# Patient Record
Sex: Female | Born: 1940 | Race: White | Hispanic: No | Smoking: Never smoker
Health system: Southern US, Community
[De-identification: ages and names within clinical notes are randomized; demographics above are authoritative.]

## PROBLEM LIST (undated history)

## (undated) DIAGNOSIS — M329 Systemic lupus erythematosus, unspecified: Secondary | ICD-10-CM

## (undated) DIAGNOSIS — IMO0002 Reserved for concepts with insufficient information to code with codable children: Secondary | ICD-10-CM

## (undated) DIAGNOSIS — K219 Gastro-esophageal reflux disease without esophagitis: Secondary | ICD-10-CM

## (undated) HISTORY — PX: KNEE SURGERY: SHX244

## (undated) HISTORY — PX: CHOLECYSTECTOMY: SHX55

## (undated) HISTORY — PX: TONSILLECTOMY: SUR1361

## (undated) HISTORY — DX: Gastro-esophageal reflux disease without esophagitis: K21.9

## (undated) HISTORY — DX: Reserved for concepts with insufficient information to code with codable children: IMO0002

## (undated) HISTORY — PX: EYE SURGERY: SHX253

## (undated) HISTORY — DX: Systemic lupus erythematosus, unspecified: M32.9

## (undated) HISTORY — PX: SKIN CANCER EXCISION: SHX779

---

## 2016-02-06 DIAGNOSIS — J309 Allergic rhinitis, unspecified: Secondary | ICD-10-CM | POA: Insufficient documentation

## 2016-02-06 DIAGNOSIS — M8580 Other specified disorders of bone density and structure, unspecified site: Secondary | ICD-10-CM | POA: Insufficient documentation

## 2016-02-06 DIAGNOSIS — K449 Diaphragmatic hernia without obstruction or gangrene: Secondary | ICD-10-CM

## 2016-02-06 DIAGNOSIS — K219 Gastro-esophageal reflux disease without esophagitis: Secondary | ICD-10-CM | POA: Insufficient documentation

## 2016-02-06 DIAGNOSIS — E782 Mixed hyperlipidemia: Secondary | ICD-10-CM

## 2016-02-06 HISTORY — DX: Allergic rhinitis, unspecified: J30.9

## 2016-02-06 HISTORY — DX: Other specified disorders of bone density and structure, unspecified site: M85.80

## 2016-02-06 HISTORY — DX: Diaphragmatic hernia without obstruction or gangrene: K44.9

## 2016-02-06 HISTORY — DX: Mixed hyperlipidemia: E78.2

## 2016-04-11 DIAGNOSIS — D126 Benign neoplasm of colon, unspecified: Secondary | ICD-10-CM | POA: Insufficient documentation

## 2016-04-11 HISTORY — DX: Benign neoplasm of colon, unspecified: D12.6

## 2017-02-06 DIAGNOSIS — H409 Unspecified glaucoma: Secondary | ICD-10-CM | POA: Insufficient documentation

## 2018-07-02 DIAGNOSIS — N814 Uterovaginal prolapse, unspecified: Secondary | ICD-10-CM

## 2018-07-02 HISTORY — DX: Uterovaginal prolapse, unspecified: N81.4

## 2018-07-16 DIAGNOSIS — R928 Other abnormal and inconclusive findings on diagnostic imaging of breast: Secondary | ICD-10-CM | POA: Insufficient documentation

## 2018-07-16 HISTORY — DX: Other abnormal and inconclusive findings on diagnostic imaging of breast: R92.8

## 2018-12-12 DIAGNOSIS — H401132 Primary open-angle glaucoma, bilateral, moderate stage: Secondary | ICD-10-CM | POA: Insufficient documentation

## 2018-12-12 HISTORY — DX: Primary open-angle glaucoma, bilateral, moderate stage: H40.1132

## 2020-03-22 DIAGNOSIS — K580 Irritable bowel syndrome with diarrhea: Secondary | ICD-10-CM | POA: Insufficient documentation

## 2020-10-26 ENCOUNTER — Other Ambulatory Visit: Payer: Self-pay | Admitting: Orthopedic Surgery

## 2020-11-10 ENCOUNTER — Other Ambulatory Visit: Payer: Self-pay

## 2020-11-12 ENCOUNTER — Encounter: Payer: Self-pay | Admitting: Family Medicine

## 2020-11-12 ENCOUNTER — Ambulatory Visit: Payer: Medicare PPO | Admitting: Family Medicine

## 2020-11-12 ENCOUNTER — Other Ambulatory Visit: Payer: Self-pay

## 2020-11-12 VITALS — BP 140/72 | HR 77 | Temp 97.4°F | Ht 64.0 in | Wt 163.2 lb

## 2020-11-12 DIAGNOSIS — R5383 Other fatigue: Secondary | ICD-10-CM | POA: Diagnosis not present

## 2020-11-12 DIAGNOSIS — K5900 Constipation, unspecified: Secondary | ICD-10-CM | POA: Insufficient documentation

## 2020-11-12 DIAGNOSIS — Z2821 Immunization not carried out because of patient refusal: Secondary | ICD-10-CM | POA: Diagnosis not present

## 2020-11-12 DIAGNOSIS — Z7689 Persons encountering health services in other specified circumstances: Secondary | ICD-10-CM | POA: Diagnosis not present

## 2020-11-12 DIAGNOSIS — N3281 Overactive bladder: Secondary | ICD-10-CM

## 2020-11-12 DIAGNOSIS — Z13820 Encounter for screening for osteoporosis: Secondary | ICD-10-CM

## 2020-11-12 HISTORY — DX: Overactive bladder: N32.81

## 2020-11-12 LAB — VITAMIN D 25 HYDROXY (VIT D DEFICIENCY, FRACTURES): VITD: 48.59 ng/mL (ref 30.00–100.00)

## 2020-11-12 LAB — VITAMIN B12: Vitamin B-12: >1526 pg/mL — ABNORMAL HIGH (ref 211–911)

## 2020-11-12 NOTE — Progress Notes (Signed)
Shelby Cardenas is a 80 y.o. female  Chief Complaint  Patient presents with  . Establish Care    NP-  establish care.  C/o trouble with bowels and seeing GI,   & fatigue.      HPI: Shelby Cardenas is a 80 y.o. female seen today to establish care with our office. Previous PCP Dr. Teressa Lower with [facility] in [place], [place]. She is married with 2 grown daughters. Granddaughter works in endoscopy at [employer].   She has lower GI issues and sees Dr. Cristina Gong with [facility]. She is schedule for colonoscopy in 11/30/19.  She is doing pelvic PT - 2 appts - but has yet to notice results.   She also saw ortho hand Dr. Daryll Brod [facility]) for cyst in Rt 5th finger PIP joint. She was going to have surgery but decided on second opinion with Dr Amedeo Plenty. This is scheduled.   Urology - Dr. Maryland Pink [phone]) - OAB  Dermatology - Dr. Sharyn Blitz [facility]; pt has a non-melanoma skin cancer and Mohs surgery. Seen every 6 mo.  Ophthalmology - Dr. Amalia Hailey  Dexa: due in 07/15/2020 Colonoscopy: done 03/29/2016 Mammo - a few years  Pt declines flu vaccine today.   She has some labs drawn for fatigue in 09/2020 - TSH, CBC with diff, CMP, sed rate - WNL.  Fatigue is ongoing issue. Sleep is good overall. She walks 1 mile most days. Drinks about 1 gal of per day of "electrolyte reduced water".  Diet: lots of fruit Steel cut oatmeal with almond milk with dried fruit and nuts for breakfast. Chicken with green beans or sweet potatoes; later in day/night - yogurt and nuts.   Past Medical History:  Diagnosis Date  . GERD (gastroesophageal reflux disease)   . Lupus [facility])     Past Surgical History:  Procedure Laterality Date  . CESAREAN SECTION    . CHOLECYSTECTOMY    . KNEE SURGERY Left   . SKIN CANCER EXCISION    . TONSILLECTOMY      Social History   Socioeconomic History  . Marital status: Married    Spouse name: Not on file  . Number of children: Not on file  . Years of education: Not on file  . Highest  education level: Not on file  Occupational History  . Not on file  Tobacco Use  . Smoking status: Never Smoker  . Smokeless tobacco: Never Used  Vaping Use  . Vaping Use: Never used  Substance and Sexual Activity  . Alcohol use: Not Currently  . Drug use: Never  . Sexual activity: Yes  Other Topics Concern  . Not on file  Social History Narrative  . Not on file   Social Determinants of Health   Financial Resource Strain: Not on file  Food Insecurity: Not on file  Transportation Needs: Not on file  Physical Activity: Not on file  Stress: Not on file  Social Connections: Not on file  Intimate Partner Violence: Not on file    Family History  Problem Relation Age of Onset  . Arthritis Mother   . Arthritis Father      Immunizations: - COVID-19 Vaccine: 12/30/2019, 11/21/2019 - does not want to get booster . Tdap (ADACEL, BOOSTRIX) 03/05/2007  . pneumococcal conjugate 13-valent (PREVNAR) 05/05/2015  . pneumococcal polysaccharide 23-valent (PNEUMOVAX 23) 03/05/2007  . zoster vaccine live (PF) (ZOSTAVAX) 10/20/2009  Outpatient Encounter Medications as of 11/12/2020  Medication Sig  . b complex vitamins capsule Take 1 capsule  by mouth daily.  . Cyanocobalamin-Methylcobalamin 600-600 MCG SUBL Place under the tongue.  . Digestive Enzymes (ENZYME DIGEST) CAPS Take by mouth.  . Lactobacillus Rhamnosus, GG, (RA PROBIOTIC DIGESTIVE CARE) CAPS Take by mouth.  . latanoprost (XALATAN) 0.005 % ophthalmic solution 1 drop into affected eye in the evening  . Multiple Vitamin (MULTIVITAMIN ADULT PO) Take by mouth.  . Turmeric Curcumin 500 MG CAPS Take 1 capsule by mouth daily.   No facility-administered encounter medications on file as of 11/12/2020.     ROS: Pertinent positives and negatives noted in HPI. Remainder of ROS non-contributory   Allergies  Allergen Reactions  . Macrobid [Nitrofurantoin] Rash  . Gabapentin Other (See Comments)    Other reaction(s): Other (See  Comments) Dizzy and fell when taking    . Prednisone     Other reaction(s): Other (See Comments), weird feeling Makes me feel weird   . Tramadol Itching    Other reaction(s): unsure  . Ciprofloxacin Other (See Comments)    Other reaction(s): weird feeling UNKNOWN UNKNOWN   . Codeine Itching and Rash    Other reaction(s): itching, rash  . Plaquenil [Hydroxychloroquine] Rash  . Sulfa Antibiotics Itching and Rash    BP 140/72   Pulse 77   Temp (!) 97.4 F (36.3 C) (Temporal)   Ht 5\' 4"  (1.626 m)   Wt 163 lb 3.2 oz (74 kg)   SpO2 97%   BMI 28.01 kg/m   Physical Exam Constitutional:      General: She is not in acute distress.    Appearance: Normal appearance. She is not ill-appearing.  Pulmonary:     Effort: No respiratory distress.  Neurological:     Mental Status: She is alert and oriented to person, place, and time.  Psychiatric:        Mood and Affect: Mood normal.        Behavior: Behavior normal.      A/P:  1. Encounter to establish care with new doctor  2. OAB (overactive bladder) - follows with urology at [facility], not on any meds   3. Influenza vaccination declined by patient  4. Encounter for screening for osteoporosis - DG Bone Density; Future  5. Fatigue, unspecified type - normal CBC, CMP, TSH in 09/2020 - VITAMIN D 25 Hydroxy (Vit-D Deficiency, Fractures) - Vitamin B12 - Iron, TIBC and Ferritin Panel - diet and activity level are good, pt seems to sleep well most nights and does not snore - unclear etiology - will contact pt once results available   This visit occurred during the SARS-CoV-2 public health emergency.  Safety protocols were in place, including screening questions prior to the visit, additional usage of staff PPE, and extensive cleaning of exam room while observing appropriate contact time as indicated for disinfecting solutions.

## 2020-11-13 LAB — IRON,TIBC AND FERRITIN PANEL
%SAT: 30 % (calc) (ref 16–45)
Ferritin: 61 ng/mL (ref 16–288)
Iron: 109 ug/dL (ref 45–160)
TIBC: 358 mcg/dL (calc) (ref 250–450)

## 2020-11-23 ENCOUNTER — Encounter: Payer: Self-pay | Admitting: Nurse Practitioner

## 2020-11-23 ENCOUNTER — Ambulatory Visit: Payer: Medicare PPO | Admitting: Nurse Practitioner

## 2020-11-23 ENCOUNTER — Other Ambulatory Visit: Payer: Self-pay

## 2020-11-23 ENCOUNTER — Ambulatory Visit (INDEPENDENT_AMBULATORY_CARE_PROVIDER_SITE_OTHER): Payer: Medicare PPO

## 2020-11-23 ENCOUNTER — Ambulatory Visit (HOSPITAL_COMMUNITY): Admission: RE | Admit: 2020-11-23 | Payer: Medicare PPO | Source: Ambulatory Visit

## 2020-11-23 VITALS — BP 134/70 | HR 78 | Temp 97.4°F | Ht 64.0 in | Wt 162.2 lb

## 2020-11-23 DIAGNOSIS — M549 Dorsalgia, unspecified: Secondary | ICD-10-CM

## 2020-11-23 DIAGNOSIS — M5134 Other intervertebral disc degeneration, thoracic region: Secondary | ICD-10-CM

## 2020-11-23 MED ORDER — TIZANIDINE HCL 2 MG PO TABS: 2.0000 mg | ORAL_TABLET | Freq: Three times a day (TID) | ORAL | 0 refills | Status: DC | PRN

## 2020-11-23 MED ORDER — MELOXICAM 7.5 MG PO TABS: 7.5000 mg | ORAL_TABLET | Freq: Every day | ORAL | 0 refills | Status: DC

## 2020-11-23 NOTE — Progress Notes (Signed)
Subjective:  Patient ID: Shelby Cardenas, female    DOB: [birth year]  Age: 80 y.o. MRN: [id number]  CC: Acute Visit (Pt c/o mid low back pain x1 week. Pt denies any known injury. Pt states she walks daily but it has been difficult since the onset of this back pain. )  Back Pain This is a new problem. The current episode started 1 to 4 weeks ago. The problem occurs constantly. The problem has been gradually worsening since onset. The pain is present in the thoracic spine. The quality of the pain is described as aching and cramping. The pain does not radiate. The pain is the same all the time. The symptoms are aggravated by lying down and twisting. Stiffness is present all day. Pertinent negatives include no chest pain, leg pain, numbness, paresis, paresthesias, tingling or weakness. She has tried NSAIDs for the symptoms. The treatment provided mild relief.   Reviewed past Medical, Social and Family history today.  Outpatient Medications Prior to Visit  Medication Sig Dispense Refill  . b complex vitamins capsule Take 1 capsule by mouth daily.    . Digestive Enzymes (ENZYME DIGEST) CAPS Take by mouth.    . Lactobacillus Rhamnosus, GG, (RA PROBIOTIC DIGESTIVE CARE) CAPS Take by mouth.    . latanoprost (XALATAN) 0.005 % ophthalmic solution 1 drop into affected eye in the evening    . Multiple Vitamin (MULTIVITAMIN ADULT PO) Take by mouth.    . Turmeric Curcumin 500 MG CAPS Take 1 capsule by mouth daily.    . Cyanocobalamin-Methylcobalamin 600-600 MCG SUBL Place under the tongue. (Patient not taking: Reported on 11/23/2020)     No facility-administered medications prior to visit.   ROS See HPI  Objective:  BP 134/70 (BP Location: Left Arm, Patient Position: Sitting, Cuff Size: Normal)   Pulse 78   Temp (!) 97.4 F (36.3 C) (Temporal)   Ht 5\' 4"  (1.626 m)   Wt 162 lb 3.2 oz (73.6 kg)   SpO2 97%   BMI 27.84 kg/m   Physical Exam Vitals reviewed.  Constitutional:      General: She is not  in acute distress. Cardiovascular:     Rate and Rhythm: Normal rate.     Pulses: Normal pulses.  Pulmonary:     Effort: Pulmonary effort is normal.  Chest:     Chest wall: No tenderness.  Abdominal:     General: There is no distension.     Palpations: Abdomen is soft.     Tenderness: There is no abdominal tenderness. There is no right CVA tenderness or left CVA tenderness.  Musculoskeletal:        General: Tenderness present.     Cervical back: Normal.     Thoracic back: Tenderness and bony tenderness present. Normal range of motion.     Lumbar back: Normal.  Skin:    Findings: No erythema or rash.  Neurological:     Mental Status: She is alert and oriented to person, place, and time.    Assessment & Plan:  This visit occurred during the SARS-CoV-2 public health emergency.  Safety protocols were in place, including screening questions prior to the visit, additional usage of staff PPE, and extensive cleaning of exam room while observing appropriate contact time as indicated for disinfecting solutions.   Shelby Cardenas was seen today for acute visit.  Diagnoses and all orders for this visit:  Acute mid back pain -     Cancel: DG Thoracic Spine W/Swimmers -  DG Thoracic Spine W/Swimmers -     meloxicam (MOBIC) 7.5 MG tablet; Take 1 tablet (7.5 mg total) by mouth daily. With food -     tiZANidine (ZANAFLEX) 2 MG tablet; Take 1-2 tablets (2-4 mg total) by mouth every 8 (eight) hours as needed for muscle spasms.  DDD (degenerative disc disease), thoracic -     meloxicam (MOBIC) 7.5 MG tablet; Take 1 tablet (7.5 mg total) by mouth daily. With food -     tiZANidine (ZANAFLEX) 2 MG tablet; Take 1-2 tablets (2-4 mg total) by mouth every 8 (eight) hours as needed for muscle spasms.  No fracture or dislocation of the thoracic spine. Mild to moderate multilevel degenerative disc disease.   Problem List Items Addressed This Visit   None   Visit Diagnoses    Acute mid back pain    -   Primary   Relevant Medications   meloxicam (MOBIC) 7.5 MG tablet   tiZANidine (ZANAFLEX) 2 MG tablet   Other Relevant Orders   DG Thoracic Spine W/Swimmers (Completed)   DDD (degenerative disc disease), thoracic       Relevant Medications   meloxicam (MOBIC) 7.5 MG tablet   tiZANidine (ZANAFLEX) 2 MG tablet      Follow-up: No follow-ups on file.  Wilfred Lacy, NP

## 2020-11-23 NOTE — Patient Instructions (Signed)
Go to lab for thoracic spine x-ray You will be contacted with results

## 2020-11-24 ENCOUNTER — Ambulatory Visit: Payer: Medicare PPO | Admitting: Family Medicine

## 2020-11-30 ENCOUNTER — Other Ambulatory Visit: Payer: Self-pay

## 2020-12-01 ENCOUNTER — Ambulatory Visit: Payer: Medicare PPO | Admitting: Family Medicine

## 2020-12-01 ENCOUNTER — Encounter: Payer: Self-pay | Admitting: Family Medicine

## 2020-12-01 VITALS — BP 134/70 | HR 76 | Temp 97.2°F | Ht 64.0 in | Wt 161.8 lb

## 2020-12-01 DIAGNOSIS — M549 Dorsalgia, unspecified: Secondary | ICD-10-CM

## 2020-12-01 DIAGNOSIS — M5134 Other intervertebral disc degeneration, thoracic region: Secondary | ICD-10-CM

## 2020-12-01 DIAGNOSIS — Z2821 Immunization not carried out because of patient refusal: Secondary | ICD-10-CM

## 2020-12-01 DIAGNOSIS — M546 Pain in thoracic spine: Secondary | ICD-10-CM

## 2020-12-01 MED ORDER — MELOXICAM 7.5 MG PO TABS: 7.5000 mg | ORAL_TABLET | Freq: Every day | ORAL | 1 refills | Status: DC

## 2020-12-01 NOTE — Progress Notes (Signed)
Shelby Cardenas is a 80 y.o. female  Chief Complaint  Patient presents with  . Acute Visit    C/o having ongoing upper back pain that is radiating into her rib cage since last OV.  Declines flu shot today.    HPI: Shelby Cardenas is a 80 y.o. female who complains of continued upper back pain. She saw my colleague Wilfred Lacy 1 week ago on 11/23/20 for this issue. Xray was done and showed mild to moderate multilevel degenerative disc disease. She was Rx'd meloxicam 7.5mg  daily w/ food and zanaflex 2mg  1-2 tabs q8PRN. Pt has been taking the meloxicam daily. She took the zanaflex "sporadically". She stopped both meds for the colonoscopy she had on Monday. She was also using a heating pad last week but has not done so recently.  Pain is not consistent. She feels it after getting up from sitting at her computer for a while or if she goes outside for a walk in the afternoon. She notes some discomfort with a deep breath. Pain is improved when she stretches out in her recliner.  Pain is not waking her from sleep.  She declines flu vaccine today.   Past Medical History:  Diagnosis Date  . GERD (gastroesophageal reflux disease)   . Lupus [facility])     Past Surgical History:  Procedure Laterality Date  . CESAREAN SECTION    . CHOLECYSTECTOMY    . KNEE SURGERY Left   . SKIN CANCER EXCISION    . TONSILLECTOMY      Social History   Socioeconomic History  . Marital status: Married    Spouse name: Not on file  . Number of children: Not on file  . Years of education: Not on file  . Highest education level: Not on file  Occupational History  . Not on file  Tobacco Use  . Smoking status: Never Smoker  . Smokeless tobacco: Never Used  Vaping Use  . Vaping Use: Never used  Substance and Sexual Activity  . Alcohol use: Not Currently  . Drug use: Never  . Sexual activity: Yes  Other Topics Concern  . Not on file  Social History Narrative  . Not on file   Social Determinants of Health    Financial Resource Strain: Not on file  Food Insecurity: Not on file  Transportation Needs: Not on file  Physical Activity: Not on file  Stress: Not on file  Social Connections: Not on file  Intimate Partner Violence: Not on file    Family History  Problem Relation Age of Onset  . Arthritis Mother   . Arthritis Father      Immunization History  Administered Date(s) Administered  . Moderna Sars-Covid-2 Vaccination 11/04/2019, 12/30/2019    Outpatient Encounter Medications as of 12/01/2020  Medication Sig  . b complex vitamins capsule Take 1 capsule by mouth daily.  . Digestive Enzymes (ENZYME DIGEST) CAPS Take by mouth.  . Lactobacillus Rhamnosus, GG, (RA PROBIOTIC DIGESTIVE CARE) CAPS Take by mouth.  . latanoprost (XALATAN) 0.005 % ophthalmic solution 1 drop into affected eye in the evening  . meloxicam (MOBIC) 7.5 MG tablet Take 1 tablet (7.5 mg total) by mouth daily. With food  . Multiple Vitamin (MULTIVITAMIN ADULT PO) Take by mouth.  Marland Kitchen tiZANidine (ZANAFLEX) 2 MG tablet Take 1-2 tablets (2-4 mg total) by mouth every 8 (eight) hours as needed for muscle spasms.  . Turmeric Curcumin 500 MG CAPS Take 1 capsule by mouth daily.   No facility-administered encounter medications  on file as of 12/01/2020.     ROS: Pertinent positives and negatives noted in HPI. Remainder of ROS non-contributory   Allergies  Allergen Reactions  . Macrobid [Nitrofurantoin] Rash  . Gabapentin Other (See Comments)    Other reaction(s): Other (See Comments) Dizzy and fell when taking    . Prednisone     Other reaction(s): Other (See Comments), weird feeling Makes me feel weird   . Tramadol Itching    Other reaction(s): unsure  . Ciprofloxacin Other (See Comments)    Other reaction(s): weird feeling UNKNOWN UNKNOWN   . Codeine Itching and Rash    Other reaction(s): itching, rash  . Plaquenil [Hydroxychloroquine] Rash  . Sulfa Antibiotics Itching and Rash    BP 134/70   Pulse 76    Temp (!) 97.2 F (36.2 C) (Temporal)   Ht 5\' 4"  (1.626 m)   Wt 161 lb 12.8 oz (73.4 kg)   SpO2 99%   BMI 27.77 kg/m   Physical Exam Constitutional:      General: She is not in acute distress.    Appearance: Normal appearance. She is not ill-appearing.  Musculoskeletal:     Thoracic back: Spasms and tenderness present. No swelling, deformity or bony tenderness. Decreased range of motion (RROM in Lt rotation and Lt sidebending).     Right lower leg: No edema.     Left lower leg: No edema.  Neurological:     General: No focal deficit present.     Mental Status: She is alert and oriented to person, place, and time.     Coordination: Coordination normal.     Gait: Gait normal.  Psychiatric:        Mood and Affect: Mood normal.        Behavior: Behavior normal.      A/P:  1. DDD (degenerative disc disease), thoracic 2. Acute bilateral thoracic back pain - I think pts acute pain is MSK in origin - heat BID followed by stretching exercises Cont - meloxicam (MOBIC) 7.5 MG tablet; Take 1 tablet (7.5 mg total) by mouth daily. With food  Dispense: 30 tablet; Refill: 1 -  Can increase to BID w/ food after 1-2 wks if no/minimal improvement - resume zanaflex 2-4mg  qHS x 4-5 nights then PRN - f/u in 2-3 wks if no/minimal improvement  3. Influenza vaccination declined by patient    This visit occurred during the SARS-CoV-2 public health emergency.  Safety protocols were in place, including screening questions prior to the visit, additional usage of staff PPE, and extensive cleaning of exam room while observing appropriate contact time as indicated for disinfecting solutions.

## 2020-12-01 NOTE — Patient Instructions (Addendum)
Heating pad 2x/day - 15-20 min on then off After heat, do gentle stretching/range of motion exercises at least once per day Continue with meloxicam 7.5mg  daily with food. Can increase to 1 tab twice per day with food Take zanaflex 2mg  1-2 tabs at bedtime nightly x 4-5 night and then as needed    Thoracic Strain Rehab Ask your health care provider which exercises are safe for you. Do exercises exactly as told by your health care provider and adjust them as directed. It is normal to feel mild stretching, pulling, tightness, or discomfort as you do these exercises. Stop right away if you feel sudden pain or your pain gets worse. Do not begin these exercises until told by your health care provider. Stretching and range-of-motion exercise This exercise warms up your muscles and joints and improves the movement and flexibility of your back and shoulders. This exercise also helps to relieve pain. Chest and spine stretch 1. Lie down on your back on a firm surface. 2. Roll a towel or a small blanket so it is about 4 inches (10 cm) in diameter. 3. Put the towel lengthwise under the middle of your back so it is under your spine, but not under your shoulder blades. 4. Put your hands behind your head and let your elbows fall to your sides. This will increase your stretch. 5. Take a deep breath (inhale). 6. Hold for __________ seconds. 7. Relax after you breathe out (exhale). Repeat __________ times. Complete this exercise __________ times a day.   Strengthening exercises These exercises build strength and endurance in your back and your shoulder blade muscles. Endurance is the ability to use your muscles for a long time, even after they get tired. Alternating arm and leg raises 1. Get on your hands and knees on a firm surface. If you are on a hard floor, you may want to use padding, such as an exercise mat, to cushion your knees. 2. Line up your arms and legs. Your hands should be directly below your  shoulders, and your knees should be directly below your hips. 3. Lift your left leg behind you. At the same time, raise your right arm and straighten it in front of you. ? Do not lift your leg higher than your hip. ? Do not lift your arm higher than your shoulder. ? Keep your abdominal and back muscles tight. ? Keep your hips facing the ground. ? Do not arch your back. ? Keep your balance carefully, and do not hold your breath. 4. Hold for __________ seconds. 5. Slowly return to the starting position and repeat with your right leg and your left arm. Repeat __________ times. Complete this exercise __________ times a day.   Straight arm rows This exercise is also called shoulder extension exercise. 1. Stand with your feet shoulder width apart. 2. Secure an exercise band to a stable object in front of you so the band is at or above shoulder height. 3. Hold one end of the exercise band in each hand. 4. Straighten your elbows and lift your hands up to shoulder height. 5. Step back, away from the secured end of the exercise band, until the band stretches. 6. Squeeze your shoulder blades together and pull your hands down to the sides of your thighs. Stop when your hands are straight down by your sides. This is shoulder extension. Do not let your hands go behind your body. 7. Hold for __________ seconds. 8. Slowly return to the starting position. Repeat __________  times. Complete this exercise __________ times a day.   Prone shoulder external rotation 1. Lie on your abdomen on a firm bed so your left / right forearm hangs over the edge of the bed and your upper arm is on the bed, straight out from your body. This is the prone position. ? Your elbow should be bent. ? Your palm should be facing your feet. 2. If instructed, hold a __________ weight in your hand. 3. Squeeze your shoulder blade toward the middle of your back. Do not let your shoulder lift toward your ear. 4. Keep your elbow bent in a  90-degree angle (right angle) while you slowly move your forearm up toward the ceiling. Move your forearm up to the height of the bed, toward your head. This is external rotation. ? Your upper arm should not move. ? At the top of the movement, your palm should face the floor. 5. Hold for __________ seconds. 6. Slowly return to the starting position and relax your muscles. Repeat __________ times. Complete this exercise __________ times a day. Rowing scapular retraction This is an exercise in which the shoulder blades (scapulae) are pulled toward each other (retraction). 1. Sit in a stable chair without armrests, or stand up. 2. Secure an exercise band to a stable object in front of you so the band is at shoulder height. 3. Hold one end of the exercise band in each hand. Your palms should face down. 4. Bring your arms out straight in front of you. 5. Step back, away from the secured end of the exercise band, until the band stretches. 6. Pull the band backward. As you do this, bend your elbows and squeeze your shoulder blades together, but avoid letting the rest of your body move. Do not shrug your shoulders upward while you do this. 7. Stop when your elbows are at your sides or slightly behind your body. 8. Hold for __________ seconds. 9. Slowly straighten your arms to return to the starting position. Repeat __________ times. Complete this exercise __________ times a day.   Posture and body mechanics Good posture and healthy body mechanics can help to relieve stress in your body's tissues and joints. Body mechanics refers to the movements and positions of your body while you do your daily activities. Posture is part of body mechanics. Good posture means:  Your spine is in its natural S-curve position (neutral).  Your shoulders are pulled back slightly.  Your head is not tipped forward. Follow these guidelines to improve your posture and body mechanics in your everyday  activities. Standing  When standing, keep your spine neutral and your feet about hip width apart. Keep a slight bend in your knees. Your ears, shoulders, and hips should line up with each other.  When you do a task in which you lean forward while standing in one place for a long time, place one foot up on a stable object that is 2-4 inches (5-10 cm) high, such as a footstool. This helps keep your spine neutral.   Sitting  When sitting, keep your spine neutral and keep your feet flat on the floor. Use a footrest, if necessary, and keep your thighs parallel to the floor. Avoid rounding your shoulders, and avoid tilting your head forward.  When working at a desk or a computer, keep your desk at a height where your hands are slightly lower than your elbows. Slide your chair under your desk so you are close enough to maintain good posture.  When  working at a computer, place your monitor at a height where you are looking straight ahead and you do not have to tilt your head forward or downward to look at the screen.   Resting When lying down and resting, avoid positions that are most painful for you.  If you have pain with activities such as sitting, bending, stooping, or squatting (flexion-basedactivities), lie in a position in which your body does not bend very much. For example, avoid curling up on your side with your arms and knees near your chest (fetal position).  If you have pain with activities such as standing for a long time or reaching with your arms (extension-basedactivities), lie with your spine in a neutral position and bend your knees slightly. Try the following positions: ? Lie on your side with a pillow between your knees. ? Lie on your back with a pillow under your knees.   Lifting  When lifting objects, keep your feet at least shoulder width apart and tighten your abdominal muscles.  Bend your knees and hips and keep your spine neutral. It is important to lift using the strength  of your legs, not your back. Do not lock your knees straight out.  Always ask for help to lift heavy or awkward objects.   This information is not intended to replace advice given to you by your health care provider. Make sure you discuss any questions you have with your health care provider. Document Revised: 02/07/2019 Document Reviewed: 11/25/2018 Elsevier Patient Education  2021 [employer].

## 2020-12-10 ENCOUNTER — Encounter (HOSPITAL_BASED_OUTPATIENT_CLINIC_OR_DEPARTMENT_OTHER): Payer: Self-pay

## 2020-12-10 ENCOUNTER — Ambulatory Visit (HOSPITAL_BASED_OUTPATIENT_CLINIC_OR_DEPARTMENT_OTHER): Admit: 2020-12-10 | Payer: Self-pay | Admitting: Orthopedic Surgery

## 2020-12-10 SURGERY — EXCISION MASS
Anesthesia: Regional | Laterality: Right

## 2021-01-24 NOTE — Progress Notes (Signed)
Subjective:   Shelby Cardenas is a 80 y.o. female who presents for Medicare Annual (Subsequent) preventive examination.  Review of Systems     Cardiac Risk Factors include: advanced age (>21men, >39 women)     Objective:    Today's Vitals   01/25/21 1050  BP: 118/78  Pulse: 68  Resp: 16  Temp: (!) 96.8 F (36 C)  TempSrc: Temporal  SpO2: 98%  Weight: 163 lb (73.9 kg)  Height: 5\' 4"  (1.626 m)   Body mass index is 27.98 kg/m.  Advanced Directives 01/25/2021  Does Patient Have a Medical Advance Directive? Yes  Type of Paramedic of [place];Living will  Copy of [place] in Chart? No - copy requested    Current Medications (verified) Outpatient Encounter Medications as of 01/25/2021  Medication Sig  . b complex vitamins capsule Take 1 capsule by mouth daily.  . Digestive Enzymes (ENZYME DIGEST) CAPS Take by mouth.  . Lactobacillus Rhamnosus, GG, (RA PROBIOTIC DIGESTIVE CARE) CAPS Take by mouth.  . latanoprost (XALATAN) 0.005 % ophthalmic solution 1 drop into affected eye in the evening  . meloxicam (MOBIC) 7.5 MG tablet Take 1 tablet (7.5 mg total) by mouth daily. With food  . Multiple Vitamin (MULTIVITAMIN ADULT PO) Take by mouth.  Marland Kitchen tiZANidine (ZANAFLEX) 2 MG tablet Take 1-2 tablets (2-4 mg total) by mouth every 8 (eight) hours as needed for muscle spasms.  . Turmeric Curcumin 500 MG CAPS Take 1 capsule by mouth daily.   No facility-administered encounter medications on file as of 01/25/2021.    Allergies (verified) Macrobid [nitrofurantoin], Gabapentin, Prednisone, Tramadol, Ciprofloxacin, Codeine, Plaquenil [hydroxychloroquine], and Sulfa antibiotics   History: Past Medical History:  Diagnosis Date  . GERD (gastroesophageal reflux disease)   . Lupus [facility])    Past Surgical History:  Procedure Laterality Date  . CESAREAN SECTION    . CHOLECYSTECTOMY    . KNEE SURGERY Left   . SKIN CANCER EXCISION    . TONSILLECTOMY      Family History  Problem Relation Age of Onset  . Arthritis Mother   . Arthritis Father    Social History   Socioeconomic History  . Marital status: Married    Spouse name: Not on file  . Number of children: Not on file  . Years of education: Not on file  . Highest education level: Not on file  Occupational History  . Not on file  Tobacco Use  . Smoking status: Never Smoker  . Smokeless tobacco: Never Used  Vaping Use  . Vaping Use: Never used  Substance and Sexual Activity  . Alcohol use: Not Currently  . Drug use: Never  . Sexual activity: Yes  Other Topics Concern  . Not on file  Social History Narrative  . Not on file   Social Determinants of Health   Financial Resource Strain: Not on file  Food Insecurity: Not on file  Transportation Needs: No Transportation Needs  . Lack of Transportation (Medical): No  . Lack of Transportation (Non-Medical): No  Physical Activity: Sufficiently Active  . Days of Exercise per Week: 7 days  . Minutes of Exercise per Session: 30 min  Stress: Not on file  Social Connections: Socially Integrated  . Frequency of Communication with Friends and Family: More than three times a week  . Frequency of Social Gatherings with Friends and Family: More than three times a week  . Attends Religious Services: 1 to 4 times per year  .  Active Member of Clubs or Organizations: Yes  . Attends Archivist Meetings: 1 to 4 times per year  . Marital Status: Married    Tobacco Counseling Counseling given: Not Answered   Clinical Intake:  Pre-visit preparation completed: Yes  Pain : No/denies pain     Nutritional Status: BMI 25 -29 Overweight Nutritional Risks: None Diabetes: No  How often do you need to have someone help you when you read instructions, pamphlets, or other written materials from your doctor or pharmacy?: 1 - Never  Diabetic?No  Interpreter Needed?: No  Information entered by :: Caroleen Hamman  LPN   Activities of Daily Living In your present state of health, do you have any difficulty performing the following activities: 01/25/2021  Hearing? N  Vision? N  Difficulty concentrating or making decisions? N  Walking or climbing stairs? N  Dressing or bathing? N  Doing errands, shopping? N  Preparing Food and eating ? N  Using the Toilet? N  In the past six months, have you accidently leaked urine? Y  Do you have problems with loss of bowel control? Y  Managing your Medications? N  Managing your Finances? N  Housekeeping or managing your Housekeeping? N    Patient Care Team: Ronnald Nian, DO as PCP - General (Family Medicine)  Indicate any recent Medical Services you may have received from other than [facility] providers in the past year (date may be approximate).     Assessment:   This is a routine wellness examination for Shelby Cardenas.  Hearing/Vision screen  Hearing Screening   125Hz  250Hz  500Hz  1000Hz  2000Hz  3000Hz  4000Hz  6000Hz  8000Hz   Right ear:           Left ear:           Comments: No issues  Vision Screening Comments: Last eye exam-11/2020-Dr. Evans  Dietary issues and exercise activities discussed: Current Exercise Habits: Home exercise routine, Type of exercise: walking, Time (Minutes): 30, Frequency (Times/Week): 7, Weekly Exercise (Minutes/Week): 210, Intensity: Mild, Exercise limited by: None identified  Goals    . Patient Stated     Get rid of extra abdominal fat & continue healthy eating      Depression Screen PHQ 2/9 Scores 01/25/2021 11/12/2020  PHQ - 2 Score 0 0    Fall Risk Fall Risk  01/25/2021  Falls in the past year? 0  Number falls in past yr: 0  Injury with Fall? 0  Follow up Falls prevention discussed    FALL RISK PREVENTION PERTAINING TO THE HOME:  Any stairs in or around the home? Yes  If so, are there any without handrails? No  Home free of loose throw rugs in walkways, pet beds, electrical cords, etc? Yes  Adequate lighting in  your home to reduce risk of falls? Yes   ASSISTIVE DEVICES UTILIZED TO PREVENT FALLS:  Life alert? No  Use of a cane, walker or w/c? No  Grab bars in the bathroom? Yes  Shower chair or bench in shower? No  Elevated toilet seat or a handicapped toilet? No   TIMED UP AND GO:  Was the test performed? Yes .  Length of time to ambulate 10 feet: 9 sec.   Gait steady and fast without use of assistive device  Cognitive Function:Normal cognitive status assessed by direct observation by this Nurse Health Advisor. No abnormalities found.          Immunizations Immunization History  Administered Date(s) Administered  . Moderna Sars-Covid-2 Vaccination 11/04/2019, 12/30/2019  .  Pneumococcal Conjugate-13 05/05/2015  . Pneumococcal Polysaccharide-23 03/05/2007  . Zoster 10/20/2009    TDAP status: Due, Education has been provided regarding the importance of this vaccine. Advised may receive this vaccine at local pharmacy or Health Dept. Aware to provide a copy of the vaccination record if obtained from local pharmacy or Health Dept. Verbalized acceptance and understanding.  Flu Vaccine status: Declined, Education has been provided regarding the importance of this vaccine but patient still declined. Advised may receive this vaccine at local pharmacy or Health Dept. Aware to provide a copy of the vaccination record if obtained from local pharmacy or Health Dept. Verbalized acceptance and understanding.  Pneumococcal vaccine status: Up to date  Covid-19 vaccine status: Declined, Education has been provided regarding the importance of this vaccine but patient still declined. Advised may receive this vaccine at local pharmacy or Health Dept.or vaccine clinic. Aware to provide a copy of the vaccination record if obtained from local pharmacy or Health Dept. Verbalized acceptance and understanding. Declined booster info  Qualifies for Shingles Vaccine? Yes   Zostavax completed Yes   Shingrix  Completed?: No.    Education has been provided regarding the importance of this vaccine. Patient has been advised to call insurance company to determine out of pocket expense if they have not yet received this vaccine. Advised may also receive vaccine at local pharmacy or Health Dept. Verbalized acceptance and understanding.  Screening Tests Health Maintenance  Topic Date Due  . Hepatitis C Screening  Never done  . TETANUS/TDAP  03/04/2017  . COVID-19 Vaccine (3 - Booster for Moderna series) 02/10/2021 (Originally 07/01/2020)  . INFLUENZA VACCINE  03/13/2021 (Originally 05/30/2020)  . DEXA SCAN  Completed  . PNA vac Low Risk Adult  Completed  . HPV VACCINES  Aged Out    Health Maintenance  Health Maintenance Due  Topic Date Due  . Hepatitis C Screening  Never done  . TETANUS/TDAP  03/04/2017    Colorectal cancer screening: No longer required.   Mammogram status: Ordered today. Pt provided with contact info and advised to call to schedule appt.    Bone Density status: Scheduled for 04/01/2021  Lung Cancer Screening: (Low Dose CT Chest recommended if Age 59-80 years, 30 pack-year currently smoking OR have quit w/in 15years.) does not qualify.     Additional Screening:  Hepatitis C Screening: does not qualify  Vision Screening: Recommended annual ophthalmology exams for early detection of glaucoma and other disorders of the eye. Is the patient up to date with their annual eye exam?  Yes  Who is the provider or what is the name of the office in which the patient attends annual eye exams? Dr. Amalia Hailey   Dental Screening: Recommended annual dental exams for proper oral hygiene  Community Resource Referral / Chronic Care Management: CRR required this visit?  No   CCM required this visit?  No      Plan:     I have personally reviewed and noted the following in the patient's chart:   . Medical and social history . Use of alcohol, tobacco or illicit drugs  . Current medications  and supplements . Functional ability and status . Nutritional status . Physical activity . Advanced directives . List of other physicians . Hospitalizations, surgeries, and ER visits in previous 12 months . Vitals . Screenings to include cognitive, depression, and falls . Referrals and appointments  In addition, I have reviewed and discussed with patient certain preventive protocols, quality metrics, and best practice recommendations.  A written personalized care plan for preventive services as well as general preventive health recommendations were provided to patient.   Patient would like to access avs on mychart.   Marta Antu, LPN   [id number]  Nurse Health Advisor  Nurse Notes: None

## 2021-01-25 ENCOUNTER — Other Ambulatory Visit: Payer: Self-pay

## 2021-01-25 ENCOUNTER — Ambulatory Visit (INDEPENDENT_AMBULATORY_CARE_PROVIDER_SITE_OTHER): Payer: Medicare PPO

## 2021-01-25 VITALS — BP 118/78 | HR 68 | Temp 96.8°F | Resp 16 | Ht 64.0 in | Wt 163.0 lb

## 2021-01-25 DIAGNOSIS — Z1231 Encounter for screening mammogram for malignant neoplasm of breast: Secondary | ICD-10-CM

## 2021-01-25 DIAGNOSIS — Z0000 Encounter for general adult medical examination without abnormal findings: Secondary | ICD-10-CM

## 2021-01-25 NOTE — Patient Instructions (Signed)
Shelby Cardenas , Thank you for taking time to come for your Medicare Wellness Visit. I appreciate your ongoing commitment to your health goals. Please review the following plan we discussed and let me know if I can assist you in the future.   Screening recommendations/referrals: Colonoscopy: No longer required. Mammogram: Ordered today. Someone will be calling you to schedule. Bone Density: Scheduled for 04/01/2021 Recommended yearly ophthalmology/optometry visit for glaucoma screening and checkup Recommended yearly dental visit for hygiene and checkup  Vaccinations: Influenza vaccine: Declined Pneumococcal vaccine: Completed vaccines Tdap vaccine: Discuss with pharmacy Shingles vaccine: Discuss with pharmacy   Covid-19:Declined booster.  Advanced directives: Please bring a copy for your chart  Conditions/risks identified: See problem list  Next appointment: Follow up in one year for your annual wellness visit    Preventive Care 65 Years and Older, Female Preventive care refers to lifestyle choices and visits with your health care provider that can promote health and wellness. What does preventive care include?  A yearly physical exam. This is also called an annual well check.  Dental exams once or twice a year.  Routine eye exams. Ask your health care provider how often you should have your eyes checked.  Personal lifestyle choices, including:  Daily care of your teeth and gums.  Regular physical activity.  Eating a healthy diet.  Avoiding tobacco and drug use.  Limiting alcohol use.  Practicing safe sex.  Taking low-dose aspirin every day.  Taking vitamin and mineral supplements as recommended by your health care provider. What happens during an annual well check? The services and screenings done by your health care provider during your annual well check will depend on your age, overall health, lifestyle risk factors, and family history of disease. Counseling  Your  health care provider may ask you questions about your:  Alcohol use.  Tobacco use.  Drug use.  Emotional well-being.  Home and relationship well-being.  Sexual activity.  Eating habits.  History of falls.  Memory and ability to understand (cognition).  Work and work Statistician.  Reproductive health. Screening  You may have the following tests or measurements:  Height, weight, and BMI.  Blood pressure.  Lipid and cholesterol levels. These may be checked every 5 years, or more frequently if you are over 65 years old.  Skin check.  Lung cancer screening. You may have this screening every year starting at age 85 if you have a 30-pack-year history of smoking and currently smoke or have quit within the past 15 years.  Fecal occult blood test (FOBT) of the stool. You may have this test every year starting at age 17.  Flexible sigmoidoscopy or colonoscopy. You may have a sigmoidoscopy every 5 years or a colonoscopy every 10 years starting at age 3.  Hepatitis C blood test.  Hepatitis B blood test.  Sexually transmitted disease (STD) testing.  Diabetes screening. This is done by checking your blood sugar (glucose) after you have not eaten for a while (fasting). You may have this done every 1-3 years.  Bone density scan. This is done to screen for osteoporosis. You may have this done starting at age 63.  Mammogram. This may be done every 1-2 years. Talk to your health care provider about how often you should have regular mammograms. Talk with your health care provider about your test results, treatment options, and if necessary, the need for more tests. Vaccines  Your health care provider may recommend certain vaccines, such as:  Influenza vaccine. This is recommended  every year.  Tetanus, diphtheria, and acellular pertussis (Tdap, Td) vaccine. You may need a Td booster every 10 years.  Zoster vaccine. You may need this after age 33.  Pneumococcal 13-valent  conjugate (PCV13) vaccine. One dose is recommended after age 73.  Pneumococcal polysaccharide (PPSV23) vaccine. One dose is recommended after age 70. Talk to your health care provider about which screenings and vaccines you need and how often you need them. This information is not intended to replace advice given to you by your health care provider. Make sure you discuss any questions you have with your health care provider. Document Released: 11/12/2015 Document Revised: 07/05/2016 Document Reviewed: 08/17/2015 Elsevier Interactive Patient Education  2017 [place] Prevention in the Home Falls can cause injuries. They can happen to people of all ages. There are many things you can do to make your home safe and to help prevent falls. What can I do on the outside of my home?  Regularly fix the edges of walkways and driveways and fix any cracks.  Remove anything that might make you trip as you walk through a door, such as a raised step or threshold.  Trim any bushes or trees on the path to your home.  Use bright outdoor lighting.  Clear any walking paths of anything that might make someone trip, such as rocks or tools.  Regularly check to see if handrails are loose or broken. Make sure that both sides of any steps have handrails.  Any raised decks and porches should have guardrails on the edges.  Have any leaves, snow, or ice cleared regularly.  Use sand or salt on walking paths during winter.  Clean up any spills in your garage right away. This includes oil or grease spills. What can I do in the bathroom?  Use night lights.  Install grab bars by the toilet and in the tub and shower. Do not use towel bars as grab bars.  Use non-skid mats or decals in the tub or shower.  If you need to sit down in the shower, use a plastic, non-slip stool.  Keep the floor dry. Clean up any water that spills on the floor as soon as it happens.  Remove soap buildup in the tub or  shower regularly.  Attach bath mats securely with double-sided non-slip rug tape.  Do not have throw rugs and other things on the floor that can make you trip. What can I do in the bedroom?  Use night lights.  Make sure that you have a light by your bed that is easy to reach.  Do not use any sheets or blankets that are too big for your bed. They should not hang down onto the floor.  Have a firm chair that has side arms. You can use this for support while you get dressed.  Do not have throw rugs and other things on the floor that can make you trip. What can I do in the kitchen?  Clean up any spills right away.  Avoid walking on wet floors.  Keep items that you use a lot in easy-to-reach places.  If you need to reach something above you, use a strong step stool that has a grab bar.  Keep electrical cords out of the way.  Do not use floor polish or wax that makes floors slippery. If you must use wax, use non-skid floor wax.  Do not have throw rugs and other things on the floor that can make you trip. What  can I do with my stairs?  Do not leave any items on the stairs.  Make sure that there are handrails on both sides of the stairs and use them. Fix handrails that are broken or loose. Make sure that handrails are as long as the stairways.  Check any carpeting to make sure that it is firmly attached to the stairs. Fix any carpet that is loose or worn.  Avoid having throw rugs at the top or bottom of the stairs. If you do have throw rugs, attach them to the floor with carpet tape.  Make sure that you have a light switch at the top of the stairs and the bottom of the stairs. If you do not have them, ask someone to add them for you. What else can I do to help prevent falls?  Wear shoes that:  Do not have high heels.  Have rubber bottoms.  Are comfortable and fit you well.  Are closed at the toe. Do not wear sandals.  If you use a stepladder:  Make sure that it is fully  opened. Do not climb a closed stepladder.  Make sure that both sides of the stepladder are locked into place.  Ask someone to hold it for you, if possible.  Clearly mark and make sure that you can see:  Any grab bars or handrails.  First and last steps.  Where the edge of each step is.  Use tools that help you move around (mobility aids) if they are needed. These include:  Canes.  Walkers.  Scooters.  Crutches.  Turn on the lights when you go into a dark area. Replace any light bulbs as soon as they burn out.  Set up your furniture so you have a clear path. Avoid moving your furniture around.  If any of your floors are uneven, fix them.  If there are any pets around you, be aware of where they are.  Review your medicines with your doctor. Some medicines can make you feel dizzy. This can increase your chance of falling. Ask your doctor what other things that you can do to help prevent falls. This information is not intended to replace advice given to you by your health care provider. Make sure you discuss any questions you have with your health care provider. Document Released: 08/12/2009 Document Revised: 03/23/2016 Document Reviewed: 11/20/2014 Elsevier Interactive Patient Education  2017 [employer].

## 2021-04-01 ENCOUNTER — Other Ambulatory Visit: Payer: Self-pay

## 2021-04-01 ENCOUNTER — Ambulatory Visit
Admission: RE | Admit: 2021-04-01 | Discharge: 2021-04-01 | Disposition: A | Discharge status: Home / Self Care | Payer: Medicare PPO | Source: Ambulatory Visit | Attending: Family Medicine | Admitting: Family Medicine

## 2021-04-01 DIAGNOSIS — Z13820 Encounter for screening for osteoporosis: Secondary | ICD-10-CM

## 2021-04-06 ENCOUNTER — Other Ambulatory Visit: Payer: Self-pay

## 2021-04-06 ENCOUNTER — Ambulatory Visit: Payer: Medicare PPO | Admitting: Family Medicine

## 2021-04-06 ENCOUNTER — Encounter: Payer: Self-pay | Admitting: Family Medicine

## 2021-04-06 VITALS — BP 128/60 | HR 96 | Temp 98.1°F | Ht 64.0 in | Wt 160.2 lb

## 2021-04-06 DIAGNOSIS — M79661 Pain in right lower leg: Secondary | ICD-10-CM | POA: Diagnosis not present

## 2021-04-06 DIAGNOSIS — R5383 Other fatigue: Secondary | ICD-10-CM

## 2021-04-06 DIAGNOSIS — M79662 Pain in left lower leg: Secondary | ICD-10-CM

## 2021-04-06 NOTE — Progress Notes (Signed)
Shelby Cardenas is a 80 y.o. female  Chief Complaint  Patient presents with  . Muscle Pain    Muscular pain in the AM & right smaller toe pain.    HPI: Shelby Cardenas is a 80 y.o. female patient who complains of lower extremity muscle and joint pain that is present first thing in the AM or if she is seated for a long period of time. Symptoms x 6 mo and pt feels likes symptoms are getting worse. She notes subjective LE weakness. No numbness or paresthesias. No back pain.  She feels the more active she is during the day, the less symptomatic she is the next day.  She notes mild swelling in her feet as the day goes on.  She has not taken anything or tried anything for the pain.  She walks regularly (at least 5 days per week) with a friend. This helps her sleep better.   + fatigue. She doesn't sleep well as husband is restless sleeper and "jerks" and moves around a lot. He also has OSA on CPAP.    Past Medical History:  Diagnosis Date  . GERD (gastroesophageal reflux disease)   . Lupus [facility])     Past Surgical History:  Procedure Laterality Date  . CESAREAN SECTION    . CHOLECYSTECTOMY    . KNEE SURGERY Left   . SKIN CANCER EXCISION    . TONSILLECTOMY      Social History   Socioeconomic History  . Marital status: Married    Spouse name: Not on file  . Number of children: Not on file  . Years of education: Not on file  . Highest education level: Not on file  Occupational History  . Not on file  Tobacco Use  . Smoking status: Never Smoker  . Smokeless tobacco: Never Used  Vaping Use  . Vaping Use: Never used  Substance and Sexual Activity  . Alcohol use: Not Currently  . Drug use: Never  . Sexual activity: Yes  Other Topics Concern  . Not on file  Social History Narrative  . Not on file   Social Determinants of Health   Financial Resource Strain: Not on file  Food Insecurity: Not on file  Transportation Needs: No Transportation Needs  . Lack of Transportation  (Medical): No  . Lack of Transportation (Non-Medical): No  Physical Activity: Sufficiently Active  . Days of Exercise per Week: 7 days  . Minutes of Exercise per Session: 30 min  Stress: Not on file  Social Connections: Socially Integrated  . Frequency of Communication with Friends and Family: More than three times a week  . Frequency of Social Gatherings with Friends and Family: More than three times a week  . Attends Religious Services: 1 to 4 times per year  . Active Member of Clubs or Organizations: Yes  . Attends Archivist Meetings: 1 to 4 times per year  . Marital Status: Married  Human resources officer Violence: Not At Risk  . Fear of Current or Ex-Partner: No  . Emotionally Abused: No  . Physically Abused: No  . Sexually Abused: No    Family History  Problem Relation Age of Onset  . Arthritis Mother   . Arthritis Father      Immunization History  Administered Date(s) Administered  . Moderna Sars-Covid-2 Vaccination 11/04/2019, 12/30/2019  . Pneumococcal Conjugate-13 05/05/2015  . Pneumococcal Polysaccharide-23 03/05/2007  . Zoster, Live 10/20/2009    Outpatient Encounter Medications as of 04/06/2021  Medication Sig  .  b complex vitamins capsule Take 1 capsule by mouth daily.  . Digestive Enzymes (ENZYME DIGEST) CAPS Take by mouth.  . Lactobacillus Rhamnosus, GG, (RA PROBIOTIC DIGESTIVE CARE) CAPS Take by mouth.  . latanoprost (XALATAN) 0.005 % ophthalmic solution 1 drop into affected eye in the evening  . Multiple Vitamin (MULTIVITAMIN ADULT PO) Take by mouth.  . Turmeric Curcumin 500 MG CAPS Take 1 capsule by mouth daily.  . [DISCONTINUED] meloxicam (MOBIC) 7.5 MG tablet Take 1 tablet (7.5 mg total) by mouth daily. With food  . [DISCONTINUED] tiZANidine (ZANAFLEX) 2 MG tablet Take 1-2 tablets (2-4 mg total) by mouth every 8 (eight) hours as needed for muscle spasms.   No facility-administered encounter medications on file as of 04/06/2021.      ROS: Pertinent positives and negatives noted in HPI. Remainder of ROS non-contributory    Allergies  Allergen Reactions  . Macrobid [Nitrofurantoin] Rash  . Gabapentin Other (See Comments)    Other reaction(s): Other (See Comments) Dizzy and fell when taking    . Prednisone     Other reaction(s): Other (See Comments), weird feeling Makes me feel weird   . Tramadol Itching    Other reaction(s): unsure  . Ciprofloxacin Other (See Comments)    Other reaction(s): weird feeling UNKNOWN UNKNOWN   . Codeine Itching and Rash    Other reaction(s): itching, rash  . Plaquenil [Hydroxychloroquine] Rash  . Sulfa Antibiotics Itching and Rash    BP 128/60   Pulse 96   Temp 98.1 F (36.7 C)   Ht 5\' 4"  (1.626 m)   Wt 160 lb 3.2 oz (72.7 kg)   SpO2 97%   BMI 27.50 kg/m   Wt Readings from Last 3 Encounters:  04/06/21 160 lb 3.2 oz (72.7 kg)  01/25/21 163 lb (73.9 kg)  12/01/20 161 lb 12.8 oz (73.4 kg)   Temp Readings from Last 3 Encounters:  04/06/21 98.1 F (36.7 C)  01/25/21 (!) 96.8 F (36 C) (Temporal)  12/01/20 (!) 97.2 F (36.2 C) (Temporal)   BP Readings from Last 3 Encounters:  04/06/21 128/60  01/25/21 118/78  12/01/20 134/70   Pulse Readings from Last 3 Encounters:  04/06/21 96  01/25/21 68  12/01/20 76     Physical Exam Constitutional:      General: She is not in acute distress.    Appearance: She is not ill-appearing.     Comments: Does appear tired  Musculoskeletal:        General: Normal range of motion.     Right lower leg: No edema.     Left lower leg: No edema.  Neurological:     Mental Status: She is alert and oriented to person, place, and time.     Sensory: No sensory deficit.     Motor: No weakness.     Coordination: Coordination normal.     Gait: Gait normal.  Psychiatric:        Mood and Affect: Mood normal.        Behavior: Behavior normal.      A/P:  1. Pain in both lower legs - pt feels pain is muscular and in both  lower legs and feet - does not sound nerve or vascular in etiology - pt walks 5 days per week w/o issue, no intermittent claudication symptoms - recommend trial of heat, exercises, BID ibuprofen x 5 days - VITAMIN D 25 Hydroxy (Vit-D Deficiency, Fractures) - CBC - Iron, TIBC and Ferritin Panel - CK Total (and  CKMB) - Vitamin O16 - Basic metabolic panel      This visit occurred during the SARS-CoV-2 public health emergency.  Safety protocols were in place, including screening questions prior to the visit, additional usage of staff PPE, and extensive cleaning of exam room while observing appropriate contact time as indicated for disinfecting solutions.

## 2021-04-06 NOTE — Patient Instructions (Addendum)
Calcium 1200mg  daily Vit D 2000IU daily Walking/weight bearing exercise  Heating pad or warm compress or warm soak 15-44min 2-3x/day Stretching exercises for lower legs at least once per day Trial of ibuprofen 400-600mg  2x/day with food x 5 days

## 2021-04-07 LAB — VITAMIN B12: Vitamin B-12: >1550 pg/mL — ABNORMAL HIGH (ref 211–911)

## 2021-04-07 LAB — IRON,TIBC AND FERRITIN PANEL
%SAT: 22 % (calc) (ref 16–45)
Ferritin: 66 ng/mL (ref 16–288)
Iron: 80 ug/dL (ref 45–160)
TIBC: 362 mcg/dL (calc) (ref 250–450)

## 2021-04-07 LAB — BASIC METABOLIC PANEL
BUN: 15 mg/dL (ref 6–23)
CO2: 25 mEq/L (ref 19–32)
Calcium: 9.1 mg/dL (ref 8.4–10.5)
Chloride: 103 mEq/L (ref 96–112)
Creatinine, Ser: 0.7 mg/dL (ref 0.40–1.20)
GFR: 82.11 mL/min (ref >60.00)
Glucose, Bld: 171 mg/dL — ABNORMAL HIGH (ref 70–99)
Potassium: 4.1 mEq/L (ref 3.5–5.1)
Sodium: 137 mEq/L (ref 135–145)

## 2021-04-07 LAB — CBC
HCT: 39.8 % (ref 36.0–46.0)
Hemoglobin: 13.1 g/dL (ref 12.0–15.0)
MCHC: 32.8 g/dL (ref 30.0–36.0)
MCV: 91.4 fl (ref 78.0–100.0)
Platelets: 218 10*3/uL (ref 150.0–400.0)
RBC: 4.36 Mil/uL (ref 3.87–5.11)
RDW: 13.2 % (ref 11.5–15.5)
WBC: 6.4 10*3/uL (ref 4.0–10.5)

## 2021-04-07 LAB — CK TOTAL AND CKMB (NOT AT ARMC)
CK, MB: 2.3 ng/mL (ref 0–5.0)
Relative Index: 1.7 (ref 0–4.0)
Total CK: 132 U/L (ref 29–143)

## 2021-04-07 LAB — VITAMIN D 25 HYDROXY (VIT D DEFICIENCY, FRACTURES): VITD: 41.36 ng/mL (ref 30.00–100.00)

## 2021-04-15 ENCOUNTER — Encounter: Payer: Self-pay | Admitting: Family Medicine

## 2021-04-21 NOTE — Telephone Encounter (Signed)
Please see message and advise.  Thank you. ° °

## 2021-05-06 ENCOUNTER — Ambulatory Visit: Payer: Medicare PPO

## 2021-05-12 ENCOUNTER — Ambulatory Visit: Payer: Medicare PPO

## 2021-05-24 ENCOUNTER — Other Ambulatory Visit: Payer: Self-pay

## 2021-05-24 ENCOUNTER — Ambulatory Visit (INDEPENDENT_AMBULATORY_CARE_PROVIDER_SITE_OTHER): Payer: Medicare PPO | Admitting: Family Medicine

## 2021-05-24 VITALS — BP 136/80 | HR 73 | Temp 97.6°F | Ht 64.0 in | Wt 163.0 lb

## 2021-05-24 DIAGNOSIS — M545 Low back pain, unspecified: Secondary | ICD-10-CM | POA: Diagnosis not present

## 2021-05-24 MED ORDER — IBUPROFEN 600 MG PO TABS: 600.0000 mg | ORAL_TABLET | Freq: Three times a day (TID) | ORAL | 0 refills | Status: DC | PRN

## 2021-05-24 NOTE — Patient Instructions (Signed)
Acute Back Pain, Adult Acute back pain is sudden and usually short-lived. It is often caused by an injury to the muscles and tissues in the back. The injury may result from: A muscle or ligament getting overstretched or torn (strained). Ligaments are tissues that connect bones to each other. Lifting something improperly can cause a back strain. Wear and tear (degeneration) of the spinal disks. Spinal disks are circular tissue that provide cushioning between the bones of the spine (vertebrae). Twisting motions, such as while playing sports or doing yard work. A hit to the back. Arthritis. You may have a physical exam, lab tests, and imaging tests to find the cause ofyour pain. Acute back pain usually goes away with rest and home care. Follow these instructions at home: Managing pain, stiffness, and swelling Treatment may include medicines for pain and inflammation that are taken by mouth or applied to the skin, prescription pain medicine, or muscle relaxants. Take over-the-counter and prescription medicines only as told by your health care provider. Your health care provider may recommend applying ice during the first 24-48 hours after your pain starts. To do this: Put ice in a plastic bag. Place a towel between your skin and the bag. Leave the ice on for 20 minutes, 2-3 times a day. If directed, apply heat to the affected area as often as told by your health care provider. Use the heat source that your health care provider recommends, such as a moist heat pack or a heating pad. Place a towel between your skin and the heat source. Leave the heat on for 20-30 minutes. Remove the heat if your skin turns bright red. This is especially important if you are unable to feel pain, heat, or cold. You have a greater risk of getting burned. Activity  Do not stay in bed. Staying in bed for more than 1-2 days can delay your recovery. Sit up and stand up straight. Avoid leaning forward when you sit or  hunching over when you stand. If you work at a desk, sit close to it so you do not need to lean over. Keep your chin tucked in. Keep your neck drawn back, and keep your elbows bent at a 90-degree angle (right angle). Sit high and close to the steering wheel when you drive. Add lower back (lumbar) support to your car seat, if needed. Take short walks on even surfaces as soon as you are able. Try to increase the length of time you walk each day. Do not sit, drive, or stand in one place for more than 30 minutes at a time. Sitting or standing for long periods of time can put stress on your back. Do not drive or use heavy machinery while taking prescription pain medicine. Use proper lifting techniques. When you bend and lift, use positions that put less stress on your back: Bend your knees. Keep the load close to your body. Avoid twisting. Exercise regularly as told by your health care provider. Exercising helps your back heal faster and helps prevent back injuries by keeping muscles strong and flexible. Work with a physical therapist to make a safe exercise program, as recommended by your health care provider. Do any exercises as told by your physical therapist.  Lifestyle Maintain a healthy weight. Extra weight puts stress on your back and makes it difficult to have good posture. Avoid activities or situations that make you feel anxious or stressed. Stress and anxiety increase muscle tension and can make back pain worse. Learn ways to manage   anxiety and stress, such as through exercise. General instructions Sleep on a firm mattress in a comfortable position. Try lying on your side with your knees slightly bent. If you lie on your back, put a pillow under your knees. Follow your treatment plan as told by your health care provider. This may include: Cognitive or behavioral therapy. Acupuncture or massage therapy. Meditation or yoga. Contact a health care provider if: You have pain that is not  relieved with rest or medicine. You have increasing pain going down into your legs or buttocks. Your pain does not improve after 2 weeks. You have pain at night. You lose weight without trying. You have a fever or chills. Get help right away if: You develop new bowel or bladder control problems. You have unusual weakness or numbness in your arms or legs. You develop nausea or vomiting. You develop abdominal pain. You feel faint. Summary Acute back pain is sudden and usually short-lived. Use proper lifting techniques. When you bend and lift, use positions that put less stress on your back. Take over-the-counter and prescription medicines and apply heat or ice as directed by your health care provider. This information is not intended to replace advice given to you by your health care provider. Make sure you discuss any questions you have with your healthcare provider. Document Revised: 07/06/2020 Document Reviewed: 07/09/2020 Elsevier Patient Education  2022 [employer] Inc[employer]  

## 2021-05-24 NOTE — Progress Notes (Signed)
[facility] [address] [place] [address] Dept: [phone] Dept Fax: [phone]  Office Visit  Subjective:    Patient ID: Shelby Cardenas, female    DOB: [birth year], 80 y.o..   MRN: [id number]  Chief Complaint  Patient presents with   Acute Visit    C/o having RT lower back x 3 days after emptying a dehymidifer, she has taken Ibuprofen and used Ice with little relief.       History of Present Illness:  Patient is in today for evaluation of low back pain. She notes she was emptying water from a dehumidifier 3 days ago. she had one foot up on a low brick wall, as she poured out the water. She had sudden onset of right-sided low back pain. She describes this as the most intense pain she has ever experienced. She has continued to have pain with movement, but also with lying down. She finds she needs to use her hands on her thighs to assist with getting up from sitting. She used ice on her back initially. She has also taken some ibuprofen for the back pain. She denies nay lower extremity pain, numbness/tingling or weakness.  Past Medical History: Patient Active Problem List   Diagnosis Date Noted   OAB (overactive bladder) 11/12/2020   Irritable bowel syndrome with diarrhea 03/22/2020   Primary open angle glaucoma of both eyes, moderate stage 12/12/2018   Mammogram abnormal 07/16/2018   Glaucoma 02/06/2017   Mixed hyperlipidemia 02/06/2016   Past Surgical History:  Procedure Laterality Date   CESAREAN SECTION     CHOLECYSTECTOMY     KNEE SURGERY Left    SKIN CANCER EXCISION     TONSILLECTOMY     Family History  Problem Relation Age of Onset   Arthritis Mother    Arthritis Father    Outpatient Medications Prior to Visit  Medication Sig Dispense Refill   b complex vitamins capsule Take 1 capsule by mouth daily.     Digestive Enzymes (ENZYME DIGEST) CAPS Take by mouth.     Lactobacillus Rhamnosus, GG, (RA PROBIOTIC  DIGESTIVE CARE) CAPS Take by mouth.     latanoprost (XALATAN) 0.005 % ophthalmic solution 1 drop into affected eye in the evening     Multiple Vitamin (MULTIVITAMIN ADULT PO) Take by mouth.     Turmeric Curcumin 500 MG CAPS Take 1 capsule by mouth daily.     No facility-administered medications prior to visit.   Allergies  Allergen Reactions   Macrobid [Nitrofurantoin] Rash   Gabapentin Other (See Comments)    Other reaction(s): Other (See Comments) Dizzy and fell when taking     Prednisone     Other reaction(s): Other (See Comments), weird feeling Makes me feel weird    Tramadol Itching    Other reaction(s): unsure   Ciprofloxacin Other (See Comments)    Other reaction(s): weird feeling UNKNOWN UNKNOWN    Codeine Itching and Rash    Other reaction(s): itching, rash   Plaquenil [Hydroxychloroquine] Rash   Sulfa Antibiotics Itching and Rash   Objective:   Today's Vitals   05/24/21 1615  BP: 136/80  Pulse: 73  Temp: 97.6 F (36.4 C)  TempSrc: Temporal  SpO2: 97%  Weight: 163 lb (73.9 kg)  Height: '5\' 4"'$  (1.626 m)   Body mass index is 27.98 kg/m.   General: Well developed, well nourished. No acute distress. Back: Straight. Pain on palpation over the right paraspinal muscle column in the upper lumbar  area. No pain over the   midline or left. Extremities: SLR mildly positive on the left. Strength 5/5 bilat. Sensation normal. Patellar reflex 1+ bilat. Psych: Alert and oriented. Normal mood and affect.  Health Maintenance Due  Topic Date Due   Hepatitis C Screening  Never done   Zoster Vaccines- Shingrix (1 of 2) Never done   TETANUS/TDAP  03/04/2017   COVID-19 Vaccine (4 - Booster) 03/31/2020     Assessment & Plan:   1. Acute right-sided low back pain without sciatica Recommend she switch to using heat on the lower several times a day with stretching. I advised for Ms. Cappiello to be up and around as pain will allow. She may resume taking some easy walks. I will  prescribe ibuprofen q 8 hours as needed for pain. Follow-up if not improved in 2 weeks.  - ibuprofen (ADVIL) 600 MG tablet; Take 1 tablet (600 mg total) by mouth every 8 (eight) hours as needed.  Dispense: 30 tablet; Refill: 0  Haydee Salter, MD

## 2021-05-30 HISTORY — PX: NEUROPLASTY / TRANSPOSITION MEDIAN NERVE AT CARPAL TUNNEL: SUR893

## 2021-06-02 ENCOUNTER — Encounter (HOSPITAL_COMMUNITY): Payer: Self-pay

## 2021-06-02 ENCOUNTER — Other Ambulatory Visit: Payer: Self-pay

## 2021-06-02 ENCOUNTER — Emergency Department (HOSPITAL_COMMUNITY)
Admission: EM | Admit: 2021-06-02 | Discharge: 2021-06-03 | Disposition: A | Discharge status: Home / Self Care | Payer: Medicare PPO | Attending: Emergency Medicine | Admitting: Emergency Medicine

## 2021-06-02 ENCOUNTER — Emergency Department (HOSPITAL_COMMUNITY): Payer: Medicare PPO

## 2021-06-02 DIAGNOSIS — Z85828 Personal history of other malignant neoplasm of skin: Secondary | ICD-10-CM | POA: Diagnosis not present

## 2021-06-02 DIAGNOSIS — R200 Anesthesia of skin: Secondary | ICD-10-CM | POA: Insufficient documentation

## 2021-06-02 DIAGNOSIS — R202 Paresthesia of skin: Secondary | ICD-10-CM | POA: Diagnosis not present

## 2021-06-02 DIAGNOSIS — S32010A Wedge compression fracture of first lumbar vertebra, initial encounter for closed fracture: Secondary | ICD-10-CM | POA: Diagnosis not present

## 2021-06-02 DIAGNOSIS — X500XXA Overexertion from strenuous movement or load, initial encounter: Secondary | ICD-10-CM | POA: Diagnosis not present

## 2021-06-02 DIAGNOSIS — S3992XA Unspecified injury of lower back, initial encounter: Secondary | ICD-10-CM | POA: Diagnosis present

## 2021-06-02 LAB — BASIC METABOLIC PANEL
Anion gap: 8 (ref 5–15)
BUN: 14 mg/dL (ref 8–23)
CO2: 26 mmol/L (ref 22–32)
Calcium: 9.1 mg/dL (ref 8.9–10.3)
Chloride: 98 mmol/L (ref 98–111)
Creatinine, Ser: 0.68 mg/dL (ref 0.44–1.00)
GFR, Estimated: >60 mL/min (ref >60)
Glucose, Bld: 128 mg/dL — ABNORMAL HIGH (ref 70–99)
Potassium: 4 mmol/L (ref 3.5–5.1)
Sodium: 132 mmol/L — ABNORMAL LOW (ref 135–145)

## 2021-06-02 LAB — CBC WITH DIFFERENTIAL/PLATELET
Abs Immature Granulocytes: 0.03 10*3/uL (ref 0.00–0.07)
Basophils Absolute: 0.1 10*3/uL (ref 0.0–0.1)
Basophils Relative: 1 %
Eosinophils Absolute: 0.3 10*3/uL (ref 0.0–0.5)
Eosinophils Relative: 3 %
HCT: 38 % (ref 36.0–46.0)
Hemoglobin: 12.5 g/dL (ref 12.0–15.0)
Immature Granulocytes: 0 %
Lymphocytes Relative: 33 %
Lymphs Abs: 2.5 10*3/uL (ref 0.7–4.0)
MCH: 29.8 pg (ref 26.0–34.0)
MCHC: 32.9 g/dL (ref 30.0–36.0)
MCV: 90.7 fL (ref 80.0–100.0)
Monocytes Absolute: 1 10*3/uL (ref 0.1–1.0)
Monocytes Relative: 13 %
Neutro Abs: 3.8 10*3/uL (ref 1.7–7.7)
Neutrophils Relative %: 50 %
Platelets: 252 10*3/uL (ref 150–400)
RBC: 4.19 MIL/uL (ref 3.87–5.11)
RDW: 12.4 % (ref 11.5–15.5)
WBC: 7.7 10*3/uL (ref 4.0–10.5)
nRBC: 0 % (ref 0.0–0.2)

## 2021-06-02 MED ORDER — OXYCODONE-ACETAMINOPHEN 5-325 MG PO TABS
1.0000 | ORAL_TABLET | Freq: Once | ORAL | Status: AC
  Administered 2021-06-02: 1 via ORAL
  Filled 2021-06-02: qty 1

## 2021-06-02 MED ORDER — OXYCODONE-ACETAMINOPHEN 5-325 MG PO TABS: 2.0000 | ORAL_TABLET | Freq: Once | ORAL | Status: DC

## 2021-06-02 MED ORDER — ONDANSETRON 4 MG PO TBDP
4.0000 mg | ORAL_TABLET | Freq: Once | ORAL | Status: AC
  Administered 2021-06-02: 4 mg via ORAL
  Filled 2021-06-02: qty 1

## 2021-06-02 NOTE — ED Provider Notes (Signed)
Emergency Medicine Provider Triage Evaluation Note  Shelby Cardenas , a 80 y.o. female  was evaluated in triage.  Pt complains of back injury 5 weeks ago.  CT PCP told she had a muscle.  She is taking ibuprofen and Tylenol for symptoms.  She had onset of severe sharp pain radiating down the back of her buttock and sciatic distribution on the left side.  Pain is "breathtaking" severe, worse with movement, no red flag symptoms including saddle anesthesia, weakness of the lower extremity.  She does have some numbness on the.  She separately counseled.  Review of Systems  Positive: Back pain, leg Negative: weakness  Physical Exam  BP (!) 166/83 (BP Location: Right Arm)   Pulse 83   Temp 99.5 F (37.5 C) (Oral)   Resp 16   Ht '5\' 4"'$  (1.626 m)   Wt 73.9 kg   SpO2 98%   BMI 27.98 kg/m  Gen:   Awake, no distress    Resp:  Normal effort  MSK:   Moves extremities without difficulty  Other:  2+ patellar reflex  Medical Decision Making  Medically screening exam initiated at 4:57 PM.  Appropriate orders placed.  Rosi Caffee was informed that the remainder of the evaluation will be completed by another provider, this initial triage assessment does not replace that evaluation, and the importance of remaining in the ED until their evaluation is complete.  Back pain -suspect sciatica- images ordered.   Margarita Mail, PA-C 06/02/21 2013    Deno Etienne, DO 06/02/21 2206

## 2021-06-02 NOTE — ED Triage Notes (Signed)
Pt reports left and right sided lower back pain for about 1.5 weeks, she saw her PCP and was told she pulled a muscle when she bent over and lifted something heavy. Pt taking ibuprofen and tylenol without relief of pain. States the pain radiates down her legs.

## 2021-06-02 NOTE — ED Notes (Signed)
I walked pt around the nurses station because sitting was exacerbating her pain. Pt steady on feet, but took a little while to get up out of the chair (was able to do it without assistance). Pt denies further needs.

## 2021-06-02 NOTE — ED Notes (Addendum)
Pt here with lower back pain radiating down L leg. Had pulled a muscle a week ago and it was radiating down R leg. Said she's been icing/heating and ibuprofen/tylenol and it had gotten better. Today pt was getting into the car and twisted her legs to get in and now the pain is back, but it's now going down her L leg. Pain is 10/10 even after the pain medication. Pt said pain is shooting and feels like a spasm. Pt has full sensation in legs, but sometimes the left one will feel numb/tingly. Pt given ice pack for pain.

## 2021-06-03 ENCOUNTER — Emergency Department (HOSPITAL_COMMUNITY): Payer: Medicare PPO

## 2021-06-03 LAB — URINALYSIS, ROUTINE W REFLEX MICROSCOPIC
Bilirubin Urine: NEGATIVE
Glucose, UA: NEGATIVE mg/dL
Hgb urine dipstick: NEGATIVE
Ketones, ur: NEGATIVE mg/dL
Nitrite: NEGATIVE
Protein, ur: NEGATIVE mg/dL
Specific Gravity, Urine: 1.005 (ref 1.005–1.030)
pH: 7 (ref 5.0–8.0)

## 2021-06-03 LAB — CBG MONITORING, ED: Glucose-Capillary: 181 mg/dL — ABNORMAL HIGH (ref 70–99)

## 2021-06-03 MED ORDER — OXYCODONE HCL 5 MG PO TABS
5.0000 mg | ORAL_TABLET | Freq: Once | ORAL | Status: AC
  Administered 2021-06-03: 5 mg via ORAL
  Filled 2021-06-03: qty 1

## 2021-06-03 MED ORDER — SODIUM CHLORIDE 0.9 % IV BOLUS
1000.0000 mL | Freq: Once | INTRAVENOUS | Status: AC
Start: 2021-06-03 — End: 2021-06-03
  Administered 2021-06-03: 1000 mL via INTRAVENOUS

## 2021-06-03 MED ORDER — OXYCODONE-ACETAMINOPHEN 5-325 MG PO TABS
0.5000 | ORAL_TABLET | Freq: Once | ORAL | Status: AC
  Administered 2021-06-03: 0.5 via ORAL
  Filled 2021-06-03: qty 1

## 2021-06-03 MED ORDER — TIZANIDINE HCL 4 MG PO TABS
4.0000 mg | ORAL_TABLET | Freq: Once | ORAL | Status: AC
  Administered 2021-06-03: 4 mg via ORAL
  Filled 2021-06-03: qty 1

## 2021-06-03 MED ORDER — OXYCODONE-ACETAMINOPHEN 5-325 MG PO TABS: 0.5000 | ORAL_TABLET | Freq: Four times a day (QID) | ORAL | 0 refills | Status: DC | PRN

## 2021-06-03 NOTE — ED Notes (Signed)
Pt ambulated down hall with no problems. Pt had no complaints.

## 2021-06-03 NOTE — ED Provider Notes (Signed)
[facility] EMERGENCY DEPARTMENT Provider Note   CSN: [id number] Arrival date & time: 06/02/21  1542     History Chief Complaint  Patient presents with   Back Pain    Shelby Cardenas is a 80 y.o. female.  The history is provided by the patient and medical records.  Back Pain Shelby Cardenas is a 80 y.o. female who presents to the Emergency Department complaining of back pain. She presents the emergency department accompanied by her daughter for evaluation of back pain. She states that two weeks ago she hurt her right low back when she was emptying a bucket of water. She has been treating that with Tylenol and ibuprofen with mild improvement in her symptoms. Today while she was sitting at she developed acute left-sided back pain. Pain is worse with movement. She feels slightly improved when she is standing and walking. She also has numbness radiating down her left posterior hip to her posterior thigh. No fevers, nausea, vomiting, abdominal pain, dysuria, urinary incontinence. No prior similar symptoms. She does not take any medications. Symptoms are moderate to severe and constant in nature.    Past Medical History:  Diagnosis Date   GERD (gastroesophageal reflux disease)    Lupus ([place])     Patient Active Problem List   Diagnosis Date Noted   OAB (overactive bladder) 11/12/2020   Irritable bowel syndrome with diarrhea 03/22/2020   Primary open angle glaucoma of both eyes, moderate stage 12/12/2018   Mammogram abnormal 07/16/2018   Glaucoma 02/06/2017   Mixed hyperlipidemia 02/06/2016    Past Surgical History:  Procedure Laterality Date   CESAREAN SECTION     CHOLECYSTECTOMY     KNEE SURGERY Left    SKIN CANCER EXCISION     TONSILLECTOMY       OB History   No obstetric history on file.     Family History  Problem Relation Age of Onset   Arthritis Mother    Arthritis Father     Social History   Tobacco Use   Smoking status: Never   Smokeless  tobacco: Never  Vaping Use   Vaping Use: Never used  Substance Use Topics   Alcohol use: Not Currently   Drug use: Never    Home Medications Prior to Admission medications   Medication Sig Start Date End Date Taking? Authorizing Provider  acetaminophen (TYLENOL) 500 MG tablet Take 500 mg by mouth every 6 (six) hours as needed for moderate pain or headache.   Yes [provider]  ibuprofen (ADVIL) 200 MG tablet Take 600 mg by mouth every 6 (six) hours as needed for headache or moderate pain.   Yes [provider]  latanoprost (XALATAN) 0.005 % ophthalmic solution Place 1 drop into both eyes at bedtime. 12/15/15  Yes [provider]  Multiple Vitamin (MULTIVITAMIN ADULT PO) Take by mouth.   Yes [provider]  oxyCODONE-acetaminophen (PERCOCET/ROXICET) 5-325 MG tablet Take 0.5 tablets by mouth every 6 (six) hours as needed for severe pain. 06/03/21  Yes Quintella Reichert, MD  Turmeric Curcumin 500 MG CAPS Take 1 capsule by mouth daily.   Yes [provider]  ibuprofen (ADVIL) 600 MG tablet Take 1 tablet (600 mg total) by mouth every 8 (eight) hours as needed. Patient not taking: Reported on 06/03/2021 05/24/21   Haydee Salter, MD    Allergies    Macrobid [nitrofurantoin], Gabapentin, Prednisone, Tramadol, Ciprofloxacin, Codeine, Plaquenil [hydroxychloroquine], and Sulfa antibiotics  Review of Systems   Review of  Systems  Musculoskeletal:  Positive for back pain.  All other systems reviewed and are negative.  Physical Exam Updated Vital Signs BP 140/70   Pulse 75   Temp 99.5 F (37.5 C) (Oral)   Resp 19   Ht '5\' 4"'$  (1.626 m)   Wt 73.9 kg   SpO2 100%   BMI 27.98 kg/m   Physical Exam Vitals and nursing note reviewed.  Constitutional:      Appearance: She is well-developed.     Comments: Appears uncomfortable. Pacing  HENT:     Head: Normocephalic and atraumatic.  Cardiovascular:     Rate and Rhythm: Normal rate and regular rhythm.      Heart sounds: No murmur heard. Pulmonary:     Effort: Pulmonary effort is normal. No respiratory distress.     Breath sounds: Normal breath sounds.  Abdominal:     Palpations: Abdomen is soft.     Tenderness: There is no abdominal tenderness. There is no guarding or rebound.  Musculoskeletal:        General: No swelling.     Comments: There is focal bony tenderness over the upper lumbar spine. There is also paraspinous muscle tenderness on the left lower back.  Skin:    General: Skin is warm and dry.  Neurological:     Mental Status: She is alert and oriented to person, place, and time.     Comments: Five out of five strength in all four extremities with sensation to light touch intact in all four extremities  Psychiatric:        Behavior: Behavior normal.    ED Results / Procedures / Treatments   Labs (all labs ordered are listed, but only abnormal results are displayed) Labs Reviewed  BASIC METABOLIC PANEL - Abnormal; Notable for the following components:      Result Value   Sodium 132 (*)    Glucose, Bld 128 (*)    All other components within normal limits  URINALYSIS, ROUTINE W REFLEX MICROSCOPIC - Abnormal; Notable for the following components:   Color, Urine STRAW (*)    Leukocytes,Ua TRACE (*)    Bacteria, UA RARE (*)    All other components within normal limits  CBG MONITORING, ED - Abnormal; Notable for the following components:   Glucose-Capillary 181 (*)    All other components within normal limits  CBC WITH DIFFERENTIAL/PLATELET    EKG EKG Interpretation  Date/Time:  Friday June 03 2021 01:42:43 EDT Ventricular Rate:  63 PR Interval:  213 QRS Duration: 153 QT Interval:  425 QTC Calculation: 435 R Axis:   81 Text Interpretation: Sinus rhythm Borderline prolonged PR interval Right bundle branch block Confirmed by Quintella Reichert [phone]) on 06/03/2021 2:01:13 AM  Radiology DG Lumbar Spine 2-3 Views  Result Date: 06/02/2021 CLINICAL DATA:  Back pain  EXAM: LUMBAR SPINE - 2-3 VIEW COMPARISON:  Thoracic radiographs 11/23/2020 FINDINGS: Transitional anatomy. For the purposes of reporting, last well-formed vertebra will be designated L5. Absent or hypoplastic ribs at T12. Lumbar alignment is normal. Moderate disc space narrowing at L5-S1. Mild superior endplate deformity at L1 of uncertain age. IMPRESSION: 1. Age indeterminate mild superior endplate deformity at L1 2. Degenerative changes at L5-S1 Electronically Signed   By: Donavan Foil M.D.   On: 06/02/2021 18:16   CT Renal Stone Study  Result Date: 06/03/2021 CLINICAL DATA:  Left flank pain. EXAM: CT ABDOMEN AND PELVIS WITHOUT CONTRAST TECHNIQUE: Multidetector CT imaging of the abdomen and pelvis was performed following the  standard protocol without IV contrast. COMPARISON:  CT abdomen pelvis dated 01/17/2014. FINDINGS: Evaluation of this exam is limited in the absence of intravenous contrast. Lower chest: The visualized lung bases are clear. No intra-abdominal free air or free fluid. Hepatobiliary: Small focus of calcification along the medial surface of the inferior right lobe of the liver, chronic and likely sequela of prior insult. No intrahepatic biliary dilatation. Cholecystectomy. No retained calcified stone noted in the central CBD. Pancreas: Unremarkable. No pancreatic ductal dilatation or surrounding inflammatory changes. Spleen: Normal in size without focal abnormality. Adrenals/Urinary Tract: The adrenal glands are unremarkable. There is a 2.5 cm cystic lesion in the upper pole of the right kidney which is suboptimally characterized on this noncontrast CT. Further evaluation with ultrasound on a nonemergent/outpatient basis recommended. There is no hydronephrosis or nephrolithiasis. The left kidney is unremarkable. The visualized ureters and urinary bladder are unremarkable. Stomach/Bowel: There is moderate stool throughout the colon. There is no bowel obstruction or active inflammation. The  appendix is normal. Vascular/Lymphatic: Mild aortoiliac atherosclerotic disease. The IVC is unremarkable. No portal venous gas. There is no adenopathy. Reproductive: The uterus and ovaries are grossly unremarkable. No pelvic mass. Other: None Musculoskeletal: Osteopenia with degenerative changes of the spine. There is compression fracture of superior endplate of L1 with approximately 20% loss of vertebral body height centrally. This is age indeterminate but likely acute or subacute. Correlation with point tenderness recommended. There is 3 mm retropulsion of the superior posterior cortex. IMPRESSION: 1. Acute appearing compression fracture of superior endplate of L1 with approximately 20% loss of vertebral body height. 2. No hydronephrosis or nephrolithiasis. 3. Aortic Atherosclerosis (ICD10-I70.0). Electronically Signed   By: Anner Crete M.D.   On: 06/03/2021 00:55    Procedures Procedures   Medications Ordered in ED Medications  ondansetron (ZOFRAN-ODT) disintegrating tablet 4 mg (4 mg Oral Given 06/02/21 1712)  oxyCODONE-acetaminophen (PERCOCET/ROXICET) 5-325 MG per tablet 1 tablet (1 tablet Oral Given 06/02/21 1711)  oxyCODONE (Oxy IR/ROXICODONE) immediate release tablet 5 mg (5 mg Oral Given 06/03/21 0006)  tiZANidine (ZANAFLEX) tablet 4 mg (4 mg Oral Given 06/03/21 0006)  sodium chloride 0.9 % bolus 1,000 mL (0 mLs Intravenous Stopped 06/03/21 0258)  oxyCODONE-acetaminophen (PERCOCET/ROXICET) 5-325 MG per tablet 0.5 tablet (0.5 tablets Oral Given 06/03/21 0530)    ED Course  I have reviewed the triage vital signs and the nursing notes.  Pertinent labs & imaging results that were available during my care of the patient were reviewed by me and considered in my medical decision making (see chart for details).    MDM Rules/Calculators/A&P                          patient here for evaluation of two weeks of low back pain, worsening over the last 24 hours. She does have paresthesias to the left  thigh. She is neurologically intact. She does have bony tenderness over the upper lumbar spine. CT scan is significant for acute L1 compression fracture. Plan to place in T also and treat her pain.  Called to bedside.  Pt became unresponsive with BP 55.  On assessment pt pale, responsive to verbal stimuli.  BP improved to 112 will place the pine..  Complains of back pain but better compared to earlier. Episode occurred about an hour and a half after her receiving a second dose of Percocet as well as tizanidine. Feel that this is likely secondary to medication reaction. She was treated with  IV fluids and observed in the department for several hours. After observation patient feeling improved. She is able to ambulate. Plan to discharge home with neurosurgery follow-up and return precautions. Final Clinical Impression(s) / ED Diagnoses Final diagnoses:  Compression fracture of L1 vertebra, initial encounter [facility])    Rx / DC Orders ED Discharge Orders          Ordered    oxyCODONE-acetaminophen (PERCOCET/ROXICET) 5-325 MG tablet  Every 6 hours PRN        06/03/21 0502             Quintella Reichert, MD 06/03/21 [phone]

## 2021-06-03 NOTE — ED Notes (Addendum)
Pt's daughter yelled for me because pt was in the wheelchair and passed out. I went over there and pt was diaphoretic and slumped over and following basic commands only. Pt pupils were 2+ and reactive. Pt wasn't able to answer any of my questions. Pt was quickly moved to the stretcher and placed in trendelenberg. Dr. Ralene Bathe came to bedside. Pt's VS were 55/33, 98% on RA and 60s for HR. IV started, EKG completed and BSG checked. Pt now speaking and oriented x4. 1 L NS started per Dr. Ralene Bathe.

## 2021-06-03 NOTE — ED Notes (Signed)
Pt moved to another room. Pt 10/10 pain and is having trouble tolerating TLSO (due to pain).

## 2021-06-03 NOTE — Progress Notes (Signed)
Orthopedic Tech Progress Note Patient Details:  Crosley Malady 09-15-1941 [id number]  Ortho Devices Ortho Device/Splint Location: TLSO Ortho Device/Splint Interventions: Ordered, Application, Adjustment   Post Interventions Patient Tolerated: Poor Instructions Provided: Adjustment of device, Care of device, Poper ambulation with device  Fantasia Jinkins 06/03/2021, 3:02 AM

## 2021-06-03 NOTE — ED Notes (Signed)
Pain still 10/10. Standing up to help relieve it. Denies further needs.

## 2021-06-03 NOTE — ED Notes (Signed)
Ortho tech paged  

## 2021-06-07 ENCOUNTER — Telehealth: Payer: Self-pay | Admitting: Family Medicine

## 2021-06-07 NOTE — Telephone Encounter (Signed)
Lft VM to rtn call. Dm/cma  

## 2021-06-07 NOTE — Telephone Encounter (Signed)
Pt daughter said that when pt was seen in ER they wanted her to setup with a Neurosurgeon but the daughter said that the one they were given they havent been able to get in touch with anyone to get an appt scheduled and wanted to know if there was anything we can do. Please advise

## 2021-06-09 ENCOUNTER — Encounter: Payer: Self-pay | Admitting: Family Medicine

## 2021-06-09 ENCOUNTER — Ambulatory Visit: Payer: Medicare PPO | Admitting: Family Medicine

## 2021-06-09 ENCOUNTER — Other Ambulatory Visit: Payer: Self-pay

## 2021-06-09 VITALS — BP 156/80 | HR 45 | Temp 97.1°F | Ht 64.0 in | Wt 164.0 lb

## 2021-06-09 DIAGNOSIS — S32010D Wedge compression fracture of first lumbar vertebra, subsequent encounter for fracture with routine healing: Secondary | ICD-10-CM

## 2021-06-09 DIAGNOSIS — M8589 Other specified disorders of bone density and structure, multiple sites: Secondary | ICD-10-CM

## 2021-06-09 DIAGNOSIS — S32010A Wedge compression fracture of first lumbar vertebra, initial encounter for closed fracture: Secondary | ICD-10-CM | POA: Insufficient documentation

## 2021-06-09 DIAGNOSIS — R600 Localized edema: Secondary | ICD-10-CM | POA: Diagnosis not present

## 2021-06-09 HISTORY — DX: Wedge compression fracture of first lumbar vertebra, initial encounter for closed fracture: S32.010A

## 2021-06-09 MED ORDER — OXYCODONE-ACETAMINOPHEN 5-325 MG PO TABS: 0.5000 | ORAL_TABLET | Freq: Four times a day (QID) | ORAL | 0 refills | Status: DC | PRN

## 2021-06-09 MED ORDER — FUROSEMIDE 20 MG PO TABS: 20.0000 mg | ORAL_TABLET | Freq: Every day | ORAL | 0 refills | Status: DC

## 2021-06-09 NOTE — Progress Notes (Signed)
[facility] [address] [place] [address] Dept: [phone] Dept Fax: [phone]  Office Visit  Subjective:    Patient ID: Shelby Cardenas, female    DOB: 03-06-1941, 80 y.o..   MRN: [id number]  Chief Complaint  Patient presents with   Follow-up    Hosp f/u 06/02/2021    History of Present Illness:  Patient is in today for follow-up from a recent ED visit secondary to a compression fracture of L1. I had seen Ms. Cleavenger on 05/24/2021 with a 3-day history of low back apin after she had emptied a dehumidifier This appeared to be muscular in origin. she was improving from this, when she developed sudden onset of severe low back pain while bending over on 06/02/2021. She apparently had some right sciatica symptoms along with this. She went to [facility]. Imaging studies showed an acute L1 compression fracture. In the ED, apparently she became oversedated with the medications she was given and required brief resuscitation and observation.  Ms. Laverdure was discharged with a back brace and a prescription for Percocet.  Ms. Selman is still having some significant pain. When this is calmed down, she uses ibuprofen, but takes a 1/2 tab of percocet for more intense pain. She finds her back brace to be very uncomfortable and the chest piece to be causing her skin to breakdown. She does have an appointment tomorrow with a neurosurgeon. Ms. Basgall is havng bowel movements. she has noted increased urination and increased urinary incontinence since the injury. She also has noted swellign her her lower ankles bilaterally.  Past Medical History: Patient Active Problem List   Diagnosis Date Noted   Closed wedge compression fracture of first lumbar vertebra ([place]) 06/09/2021   OAB (overactive bladder) 11/12/2020   Irritable bowel syndrome with diarrhea 03/22/2020   Primary open angle glaucoma of both eyes, moderate stage 12/12/2018   Mammogram  abnormal 07/16/2018   Cystocele with prolapse 07/02/2018   Glaucoma 02/06/2017   Adenomatous colon polyp 04/11/2016   Mixed hyperlipidemia 02/06/2016   Osteopenia 02/06/2016   GERD (gastroesophageal reflux disease) 02/06/2016   Diaphragmatic hernia 02/06/2016   Allergic rhinitis 02/06/2016   Past Surgical History:  Procedure Laterality Date   CESAREAN SECTION     CHOLECYSTECTOMY     KNEE SURGERY Left    SKIN CANCER EXCISION     TONSILLECTOMY     Family History  Problem Relation Age of Onset   Arthritis Mother    Arthritis Father    Outpatient Medications Prior to Visit  Medication Sig Dispense Refill   acetaminophen (TYLENOL) 500 MG tablet Take 500 mg by mouth every 6 (six) hours as needed for moderate pain or headache.     ibuprofen (ADVIL) 200 MG tablet Take 600 mg by mouth every 6 (six) hours as needed for headache or moderate pain.     ibuprofen (ADVIL) 600 MG tablet Take 1 tablet (600 mg total) by mouth every 8 (eight) hours as needed. 30 tablet 0   latanoprost (XALATAN) 0.005 % ophthalmic solution Place 1 drop into both eyes at bedtime.     Multiple Vitamin (MULTIVITAMIN ADULT PO) Take by mouth.     Turmeric Curcumin 500 MG CAPS Take 1 capsule by mouth daily.     oxyCODONE-acetaminophen (PERCOCET/ROXICET) 5-325 MG tablet Take 0.5 tablets by mouth every 6 (six) hours as needed for severe pain. 12 tablet 0   No facility-administered medications prior to visit.   Allergies  Allergen Reactions  Macrobid [Nitrofurantoin] Rash   Gabapentin Other (See Comments)    Other reaction(s): Other (See Comments) Dizzy and fell when taking     Prednisone     Other reaction(s): Other (See Comments), weird feeling Makes me feel weird    Tramadol Itching    Other reaction(s): unsure   Ciprofloxacin Other (See Comments)    Other reaction(s): weird feeling UNKNOWN UNKNOWN    Codeine Itching and Rash    Other reaction(s): itching, rash   Plaquenil [Hydroxychloroquine] Rash    Sulfa Antibiotics Itching and Rash   Objective:   Today's Vitals   06/09/21 1143  BP: (!) 156/80  Pulse: (!) 45  Temp: (!) 97.1 F (36.2 C)  TempSrc: Temporal  SpO2: 98%  Weight: 164 lb (74.4 kg)  Height: '5\' 4"'$  (1.626 m)   Body mass index is 28.15 kg/m.   General: Well developed, well nourished. No acute distress. Extremities: 1+ edema noted. Psych: Alert and oriented. Normal mood and affect.  Health Maintenance Due  Topic Date Due   Hepatitis C Screening  Never done   Zoster Vaccines- Shingrix (1 of 2) Never done   TETANUS/TDAP  03/04/2017   COVID-19 Vaccine (4 - Booster) 03/31/2020   INFLUENZA VACCINE  05/30/2021   Lab Results BMP Latest Ref Rng & Units 06/02/2021 04/06/2021  Glucose 70 - 99 mg/dL 128(H) 171(H)  BUN 8 - 23 mg/dL 14 15  Creatinine 0.44 - 1.00 mg/dL 0.68 0.70  Sodium 135 - 145 mmol/L 132(L) 137  Potassium 3.5 - 5.1 mmol/L 4.0 4.1  Chloride 98 - 111 mmol/L 98 103  CO2 22 - 32 mmol/L 26 25  Calcium 8.9 - 10.3 mg/dL 9.1 9.1   Lab Results  Component Value Date   WBC 7.7 06/02/2021   HGB 12.5 06/02/2021   HCT 38.0 06/02/2021   MCV 90.7 06/02/2021   PLT 252 06/02/2021   Imaging: Lumbar X-ray (06/02/2021) IMPRESSION: 1. Age indeterminate mild superior endplate deformity at L1 2. Degenerative changes at L5-S1  CT Renal stone study (06/03/2021) IMPRESSION: 1. Acute appearing compression fracture of superior endplate of L1 with approximately 20% loss of vertebral body height. 2. No hydronephrosis or nephrolithiasis. 3. Aortic Atherosclerosis (ICD10-I70.0).    Bone density (04/01/2021) ASSESSMENT: The BMD measured at AP Spine L1-L4 is 1.001 g/cm2 with a T-score of -1.5. This patient is considered osteopenic/low bone mass according to [facility]) criteria.   The quality of the exam is good.   Site Region Measured Date Measured Age YA BMD Significant CHANGE T-score   AP Spine  L1-L4      04/01/2021    [age]         -1.5    1.001  g/cm2   DualFemur Total Left 04/01/2021    79.6         -0.7    0.922 g/cm2   DualFemur Total Mean 04/01/2021    79.6         -0.4    0.962 g/cm2 Assessment & Plan:   1. Closed wedge compression fracture of L1 vertebra with routine healing, subsequent encounter Patient is referred to neurosurgery. In the meantime, I will renew her Percocet prescription. He approach to pain management seems judicious. I will see her back in 2 weeks.  - oxyCODONE-acetaminophen (PERCOCET/ROXICET) 5-325 MG tablet; Take 0.5 tablets by mouth every 6 (six) hours as needed for severe pain.  Dispense: 20 tablet; Refill: 0  2. Osteopenia of multiple sites Review of prior Dexa scan  shows osteopenia. After seen by neurosurgery, we will discuss potential treatment with a bisphospanate for prevention fo further osteoporotic fractures.  3. Pedal edema Review of the chart indicates MS. Bench has had edema int he past. I suspect her current use of NSAIDs to be exacerbating this. I will add a course of Lasix over the next two weeks to try and improve this.  - furosemide (LASIX) 20 MG tablet; Take 1 tablet (20 mg total) by mouth daily.  Dispense: 14 tablet; Refill: 0    Haydee Salter, MD

## 2021-06-09 NOTE — Telephone Encounter (Signed)
Has appt today at 11:30 am. Dm/cma

## 2021-06-24 ENCOUNTER — Ambulatory Visit (INDEPENDENT_AMBULATORY_CARE_PROVIDER_SITE_OTHER): Payer: Medicare PPO | Admitting: Family Medicine

## 2021-06-24 ENCOUNTER — Encounter: Payer: Self-pay | Admitting: Family Medicine

## 2021-06-24 ENCOUNTER — Other Ambulatory Visit: Payer: Self-pay

## 2021-06-24 VITALS — BP 152/80 | HR 71 | Temp 97.1°F | Ht 64.0 in | Wt 160.8 lb

## 2021-06-24 DIAGNOSIS — M8589 Other specified disorders of bone density and structure, multiple sites: Secondary | ICD-10-CM

## 2021-06-24 DIAGNOSIS — S32010D Wedge compression fracture of first lumbar vertebra, subsequent encounter for fracture with routine healing: Secondary | ICD-10-CM

## 2021-06-24 NOTE — Progress Notes (Signed)
[facility] [address] [place] [address] Dept: [phone] Dept Fax: [phone]  Office Visit  Subjective:    Patient ID: Mickie Bail, female    DOB: [birth year], 80 y.o..   MRN: [id number]  Chief Complaint  Patient presents with   Follow-up    2 wk f/u    History of Present Illness:  Patient is in today for for reassessment o her spinal compression fracture. Last week she underwent kyphoplasty of the fractured vertebrae. She notes he pain is much improved at this point. She odes have some milder pain now towards either side of the lower back near the ileal crests. She is no longer taking her pain medication. She is also not needing to use the back brace, so is grateful to be out of this. She notes she was previously prescribed a calcium+Vit D supplement, but had not been taking it. She is feeling committed to this therapy. Ms. Nienow had previously been on Boniva, but did not tolerate this.  Past Medical History: Patient Active Problem List   Diagnosis Date Noted   Closed wedge compression fracture of first lumbar vertebra ([place]) 06/09/2021   OAB (overactive bladder) 11/12/2020   Irritable bowel syndrome with diarrhea 03/22/2020   Primary open angle glaucoma of both eyes, moderate stage 12/12/2018   Mammogram abnormal 07/16/2018   Cystocele with prolapse 07/02/2018   Glaucoma 02/06/2017   Adenomatous colon polyp 04/11/2016   Mixed hyperlipidemia 02/06/2016   Osteopenia 02/06/2016   GERD (gastroesophageal reflux disease) 02/06/2016   Diaphragmatic hernia 02/06/2016   Allergic rhinitis 02/06/2016   Past Surgical History:  Procedure Laterality Date   CESAREAN SECTION     CHOLECYSTECTOMY     KNEE SURGERY Left    SKIN CANCER EXCISION     TONSILLECTOMY     Family History  Problem Relation Age of Onset   Arthritis Mother    Arthritis Father    Outpatient Medications Prior to Visit  Medication Sig Dispense  Refill   furosemide (LASIX) 20 MG tablet Take 1 tablet (20 mg total) by mouth daily. 14 tablet 0   latanoprost (XALATAN) 0.005 % ophthalmic solution Place 1 drop into both eyes at bedtime.     Multiple Vitamin (MULTIVITAMIN ADULT PO) Take by mouth.     Turmeric Curcumin 500 MG CAPS Take 1 capsule by mouth daily.     ibuprofen (ADVIL) 200 MG tablet Take 600 mg by mouth every 6 (six) hours as needed for headache or moderate pain.     acetaminophen (TYLENOL) 500 MG tablet Take 500 mg by mouth every 6 (six) hours as needed for moderate pain or headache. (Patient not taking: Reported on 06/24/2021)     ibuprofen (ADVIL) 600 MG tablet Take 1 tablet (600 mg total) by mouth every 8 (eight) hours as needed. (Patient not taking: Reported on 06/24/2021) 30 tablet 0   oxyCODONE-acetaminophen (PERCOCET/ROXICET) 5-325 MG tablet Take 0.5 tablets by mouth every 6 (six) hours as needed for severe pain. (Patient not taking: Reported on 06/24/2021) 20 tablet 0   No facility-administered medications prior to visit.   Allergies  Allergen Reactions   Macrobid [Nitrofurantoin] Rash   Gabapentin Other (See Comments)    Other reaction(s): Other (See Comments) Dizzy and fell when taking     Prednisone     Other reaction(s): Other (See Comments), weird feeling Makes me feel weird    Tizanidine     Low bp   Tramadol Itching  Other reaction(s): unsure   Ciprofloxacin Other (See Comments)    Other reaction(s): weird feeling UNKNOWN UNKNOWN    Codeine Itching and Rash    Other reaction(s): itching, rash   Plaquenil [Hydroxychloroquine] Rash   Sulfa Antibiotics Itching and Rash   Objective:   Today's Vitals   06/24/21 1507  BP: (!) 152/80  Pulse: 71  Temp: (!) 97.1 F (36.2 C)  TempSrc: Temporal  SpO2: 97%  Weight: 160 lb 12.8 oz (72.9 kg)  Height: '5\' 4"'$  (1.626 m)   Body mass index is 27.6 kg/m.   General: Well developed, well nourished. No acute distress. Psych: Alert and oriented. Normal mood  and affect.  Health Maintenance Due  Topic Date Due   Hepatitis C Screening  Never done   Zoster Vaccines- Shingrix (1 of 2) Never done   TETANUS/TDAP  03/04/2017   COVID-19 Vaccine (4 - Booster) 04/30/2020   INFLUENZA VACCINE  05/30/2021     Assessment & Plan:   1. Closed wedge compression fracture of L1 vertebra with routine healing, subsequent encounter Pain much improved currently s/p kyphoplasty. She will continue to follow with the neurosurgeon.  2. Osteopenia of multiple sites Reviewed results of past bone density study. I encouraged her to start taking her calcium and Vitamin D. As she did not tolerate a bisphosphonate, she might be a candidate for denosumab (Prolia). We will readdress at her next visit.  Haydee Salter, MD

## 2021-06-28 ENCOUNTER — Other Ambulatory Visit: Payer: Self-pay | Admitting: Family Medicine

## 2021-06-28 DIAGNOSIS — R600 Localized edema: Secondary | ICD-10-CM

## 2021-06-30 ENCOUNTER — Other Ambulatory Visit: Payer: Self-pay | Admitting: Family Medicine

## 2021-06-30 ENCOUNTER — Ambulatory Visit
Admission: RE | Admit: 2021-06-30 | Discharge: 2021-06-30 | Disposition: A | Discharge status: Home / Self Care | Payer: Medicare PPO | Source: Ambulatory Visit | Attending: Family Medicine | Admitting: Family Medicine

## 2021-06-30 ENCOUNTER — Other Ambulatory Visit: Payer: Self-pay

## 2021-06-30 DIAGNOSIS — Z1231 Encounter for screening mammogram for malignant neoplasm of breast: Secondary | ICD-10-CM

## 2021-06-30 LAB — HM MAMMOGRAPHY

## 2021-07-05 ENCOUNTER — Other Ambulatory Visit: Payer: Self-pay | Admitting: Orthopedic Surgery

## 2021-07-05 DIAGNOSIS — M8008XD Age-related osteoporosis with current pathological fracture, vertebra(e), subsequent encounter for fracture with routine healing: Secondary | ICD-10-CM

## 2021-07-08 ENCOUNTER — Other Ambulatory Visit: Payer: Self-pay

## 2021-07-08 ENCOUNTER — Ambulatory Visit: Payer: Medicare PPO | Admitting: Nurse Practitioner

## 2021-07-08 ENCOUNTER — Encounter: Payer: Self-pay | Admitting: Nurse Practitioner

## 2021-07-08 VITALS — BP 108/68 | HR 98 | Temp 97.4°F | Wt 161.4 lb

## 2021-07-08 DIAGNOSIS — R1013 Epigastric pain: Secondary | ICD-10-CM | POA: Diagnosis not present

## 2021-07-08 MED ORDER — PANTOPRAZOLE SODIUM 20 MG PO TBEC: DELAYED_RELEASE_TABLET | ORAL | 0 refills | Status: DC

## 2021-07-08 NOTE — Progress Notes (Signed)
Subjective:  Patient ID: Shelby Cardenas, female    DOB: Sep 01, 1941  Age: 80 y.o. MRN: [id number]  CC: Acute Visit (Pt states she has been having issues with GERD x 4 days. Pt states it was worse at night so she has tired elevating her bed and took otc acid reflux medications and nothing has helped. Pt states the only thing that has changed is she has been taking Ibuprofen daily since 04/2021/Declines vaccines at this time. )  Abdominal Pain This is a new problem. The current episode started 1 to 4 weeks ago. The onset quality is gradual. The problem occurs constantly. The problem has been unchanged. The pain is located in the epigastric region. The quality of the pain is a sensation of fullness. The abdominal pain radiates to the epigastric region. Associated symptoms include belching. Pertinent negatives include no anorexia, constipation, diarrhea, fever, flatus, hematochezia, melena, nausea or weight loss. The pain is aggravated by NSAIDs (laying down).  current use of ibuprofen '600mg'$  at least once a day due to acute lumbar spine compression fracture. She also completed oral prednisone dose pack last week. Denies any symptoms with exertion. Her last meal is 4hrs prior to bedtime. Some relief with gaviscon OTC-2tabs S/p cholecystectomy Reviewed CT renal done 05/2021  Reviewed past Medical, Social and Family history today.  Outpatient Medications Prior to Visit  Medication Sig Dispense Refill   furosemide (LASIX) 20 MG tablet Take 1 tablet (20 mg total) by mouth daily. 14 tablet 0   latanoprost (XALATAN) 0.005 % ophthalmic solution Place 1 drop into both eyes at bedtime.     Multiple Vitamin (MULTIVITAMIN ADULT PO) Take by mouth.     Turmeric Curcumin 500 MG CAPS Take 1 capsule by mouth daily.     ibuprofen (ADVIL) 200 MG tablet Take 600 mg by mouth every 6 (six) hours as needed for headache or moderate pain.     No facility-administered medications prior to visit.   ROS See  HPI  Objective:  BP 108/68 (BP Location: Left Arm, Patient Position: Sitting, Cuff Size: Normal)   Pulse 98   Temp (!) 97.4 F (36.3 C) (Temporal)   Wt 161 lb 6.4 oz (73.2 kg)   SpO2 99%   BMI 27.70 kg/m   Physical Exam Vitals reviewed.  Cardiovascular:     Rate and Rhythm: Normal rate and regular rhythm.     Pulses: Normal pulses.     Heart sounds: Normal heart sounds.  Pulmonary:     Effort: Pulmonary effort is normal.     Breath sounds: Normal breath sounds.  Abdominal:     General: Bowel sounds are normal. There is no distension.     Palpations: Abdomen is soft.     Tenderness: There is abdominal tenderness in the epigastric area. There is no guarding or rebound.     Hernia: There is no hernia in the umbilical area or ventral area.  Neurological:     Mental Status: She is alert.   Assessment & Plan:  This visit occurred during the SARS-CoV-2 public health emergency.  Safety protocols were in place, including screening questions prior to the visit, additional usage of staff PPE, and extensive cleaning of exam room while observing appropriate contact time as indicated for disinfecting solutions.   Florina was seen today for acute visit.  Diagnoses and all orders for this visit:  Dyspepsia -     pantoprazole (PROTONIX) 20 MG tablet; 1tab BID x7days, then 1tab daily x 14days She is to  stop all NSAIDs: ibuprofen, advil, naproxen, aleve. Use only tylenol '500mg'$  every 6hrs as needed for pain. Start pantoprazole. Call office if no improvement in 1week Problem List Items Addressed This Visit   None Visit Diagnoses     Dyspepsia    -  Primary   Relevant Medications   pantoprazole (PROTONIX) 20 MG tablet       Follow-up: Return if symptoms worsen or fail to improve.  Wilfred Lacy, NP

## 2021-07-08 NOTE — Patient Instructions (Signed)
Stop all NSAIDs: ibuprofen, advil, naproxen, aleve. Use only tylenol '500mg'$  every 6hrs as needed for pain.  Start pantoprazole. Call office if no improvement in 1week.  Indigestion Indigestion is a feeling of pain, discomfort, burning, or fullness in the upper part of your belly (abdomen). It can come and go. It may happen often or rarely. Indigestion tends to happen while you are eating or right after you have finished eating. It may be worse: At night. When bending over. While lying down. Indigestion may be a symptom of another condition. Follow these instructions at home: Eating and drinking  Follow an eating plan as told by your doctor. You may need to avoid foods and drinks such as: Chocolate and cocoa. Peppermint and mint flavorings. Garlic and onions. Horseradish. Spicy and acidic foods, such as: Peppers. Chili powder and curry powder. Vinegar. Hot sauces and BBQ sauce. Citrus fruits, such as: Oranges. Lemons. Limes. Tomato-based foods, such as: Red sauce and pizza with red sauce. Chili. Salsa. Fried and fatty foods, such as: Donuts. [place] fries and potato chips. High-fat dressings. High-fat meats, such as: Hot dogs and sausage. Rib eye steak. Ham and bacon. High-fat dairy items, such as: Whole milk. Butter. Cream cheese. Coffee and tea, with or without caffeine. Drinks that contain alcohol. Energy drinks and sports drinks. Carbonated drinks or sodas. Citrus fruit juices. Eat small meals often. Avoid eating large meals. Avoid drinking large amounts of liquid with your meals. Avoid eating meals during the 2-3 hours before bedtime. Avoid lying down right after you eat. Avoid exercise for 2 hours after you eat. Lifestyle   Maintain a healthy weight. Ask your doctor what weight is healthy for you. If you need to lose weight, work with your doctor. Exercise for at least 30 minutes on 5 or more days each week, or as told by your doctor. Avoid exercises  that include bending forward. This can make your symptoms worse. Wear loose clothes. Do not wear anything tight around your waist. Do not smoke or use any products that contain nicotine or tobacco. These can make your symptoms worse. If you need help quitting, ask your doctor. Raise (elevate) the head of your bed about 6 inches (15 cm) when you sleep. You can use a wedge to do this. Try to lower your stress. If you need help doing this, ask your doctor. General instructions Take over-the-counter and prescription medicines only as told by your doctor. Do not take aspirin or NSAIDs, such as ibuprofen, unless your doctor says it is okay. Watch for any changes in your symptoms. Keep all follow-up visits. Contact a doctor if: You have new symptoms. You lose weight and you do not know why. You have trouble swallowing, or it hurts to swallow. Your symptoms do not get better with treatment. Your symptoms last for more than 2 days. You have a fever. You vomit. Get help right away if: You have pain in your arms, neck, jaw, teeth, or back all of a sudden. You feel sweaty, dizzy, or light-headed all of a sudden. You faint. You have chest pain or shortness of breath. You cannot stop vomiting, or you vomit blood. Your poop (stool) is bloody or black. You have very bad pain in your belly. These symptoms may be an emergency. Get help right away. Call your local emergency services (911 in the [place] Do not wait to see if the symptoms will go away. Do not drive yourself to the hospital. Summary Indigestion is a feeling of pain, discomfort,  burning, or fullness in the upper part of your belly. It tends to happen while you are eating or right after you have finished eating. Follow an eating plan and other lifestyle changes as told by your doctor. Take over-the-counter and prescription medicines only as told by your doctor. Do not take aspirin or NSAIDs, such as ibuprofen, unless your doctor says it is  okay. Contact your doctor if your symptoms do not get better or they get worse. Some symptoms may represent a serious problem that is an emergency. Do not wait to see if the symptoms will go away. Get medical help right away. This information is not intended to replace advice given to you by your health care provider. Make sure you discuss any questions you have with your health care provider. Document Revised: 04/21/2020 Document Reviewed: 04/21/2020 Elsevier Patient Education  [employer].

## 2021-07-16 ENCOUNTER — Other Ambulatory Visit: Payer: Self-pay

## 2021-07-16 ENCOUNTER — Ambulatory Visit
Admission: RE | Admit: 2021-07-16 | Discharge: 2021-07-16 | Disposition: A | Discharge status: Home / Self Care | Payer: Medicare PPO | Source: Ambulatory Visit | Attending: Orthopedic Surgery | Admitting: Orthopedic Surgery

## 2021-07-16 DIAGNOSIS — M8008XD Age-related osteoporosis with current pathological fracture, vertebra(e), subsequent encounter for fracture with routine healing: Secondary | ICD-10-CM

## 2021-08-31 DIAGNOSIS — M545 Low back pain, unspecified: Secondary | ICD-10-CM | POA: Diagnosis not present

## 2021-09-02 DIAGNOSIS — M47816 Spondylosis without myelopathy or radiculopathy, lumbar region: Secondary | ICD-10-CM | POA: Diagnosis not present

## 2021-09-02 DIAGNOSIS — M5116 Intervertebral disc disorders with radiculopathy, lumbar region: Secondary | ICD-10-CM | POA: Diagnosis not present

## 2021-09-05 DIAGNOSIS — M545 Low back pain, unspecified: Secondary | ICD-10-CM | POA: Diagnosis not present

## 2021-09-06 ENCOUNTER — Encounter: Payer: Self-pay | Admitting: Family Medicine

## 2021-09-06 DIAGNOSIS — M5116 Intervertebral disc disorders with radiculopathy, lumbar region: Secondary | ICD-10-CM

## 2021-09-06 HISTORY — DX: Intervertebral disc disorders with radiculopathy, lumbar region: M51.16

## 2021-09-07 DIAGNOSIS — H0100B Unspecified blepharitis left eye, upper and lower eyelids: Secondary | ICD-10-CM | POA: Diagnosis not present

## 2021-09-07 DIAGNOSIS — H04123 Dry eye syndrome of bilateral lacrimal glands: Secondary | ICD-10-CM | POA: Diagnosis not present

## 2021-09-07 DIAGNOSIS — H52203 Unspecified astigmatism, bilateral: Secondary | ICD-10-CM | POA: Diagnosis not present

## 2021-09-07 DIAGNOSIS — Z83511 Family history of glaucoma: Secondary | ICD-10-CM | POA: Diagnosis not present

## 2021-09-07 DIAGNOSIS — H401132 Primary open-angle glaucoma, bilateral, moderate stage: Secondary | ICD-10-CM | POA: Diagnosis not present

## 2021-09-07 DIAGNOSIS — H43813 Vitreous degeneration, bilateral: Secondary | ICD-10-CM | POA: Diagnosis not present

## 2021-09-07 DIAGNOSIS — H0100A Unspecified blepharitis right eye, upper and lower eyelids: Secondary | ICD-10-CM | POA: Diagnosis not present

## 2021-09-07 DIAGNOSIS — Z961 Presence of intraocular lens: Secondary | ICD-10-CM | POA: Diagnosis not present

## 2021-09-07 DIAGNOSIS — H26491 Other secondary cataract, right eye: Secondary | ICD-10-CM | POA: Diagnosis not present

## 2021-09-07 DIAGNOSIS — M545 Low back pain, unspecified: Secondary | ICD-10-CM | POA: Diagnosis not present

## 2021-09-07 LAB — HM DIABETES EYE EXAM

## 2021-09-12 DIAGNOSIS — M545 Low back pain, unspecified: Secondary | ICD-10-CM | POA: Diagnosis not present

## 2021-09-13 ENCOUNTER — Other Ambulatory Visit: Payer: Self-pay

## 2021-09-13 ENCOUNTER — Ambulatory Visit (INDEPENDENT_AMBULATORY_CARE_PROVIDER_SITE_OTHER): Payer: Medicare PPO

## 2021-09-13 DIAGNOSIS — Z23 Encounter for immunization: Secondary | ICD-10-CM

## 2021-09-13 NOTE — Progress Notes (Addendum)
Per the orders of Dr. Gena Fray pt is here flu shot. Pt received high dose flu vaccine in left deltoid at 09:45 am, given by [place] CMA/CPT, pt tolerated vaccine well.

## 2021-09-14 DIAGNOSIS — M545 Low back pain, unspecified: Secondary | ICD-10-CM | POA: Diagnosis not present

## 2021-09-19 DIAGNOSIS — M5116 Intervertebral disc disorders with radiculopathy, lumbar region: Secondary | ICD-10-CM | POA: Diagnosis not present

## 2021-09-27 DIAGNOSIS — M545 Low back pain, unspecified: Secondary | ICD-10-CM | POA: Diagnosis not present

## 2021-09-29 DIAGNOSIS — M545 Low back pain, unspecified: Secondary | ICD-10-CM | POA: Diagnosis not present

## 2021-10-04 DIAGNOSIS — M545 Low back pain, unspecified: Secondary | ICD-10-CM | POA: Diagnosis not present

## 2021-10-05 DIAGNOSIS — M47816 Spondylosis without myelopathy or radiculopathy, lumbar region: Secondary | ICD-10-CM | POA: Diagnosis not present

## 2021-10-05 DIAGNOSIS — M5116 Intervertebral disc disorders with radiculopathy, lumbar region: Secondary | ICD-10-CM | POA: Diagnosis not present

## 2021-10-05 DIAGNOSIS — Z6827 Body mass index (BMI) 27.0-27.9, adult: Secondary | ICD-10-CM | POA: Diagnosis not present

## 2021-10-05 DIAGNOSIS — M810 Age-related osteoporosis without current pathological fracture: Secondary | ICD-10-CM | POA: Diagnosis not present

## 2021-10-05 IMAGING — DX DG THORACIC SPINE 3V
2 series · 2 of 2 positions shown · non-contrast
Comparison: None.

CLINICAL DATA: Back pain, worsening for 48 hours, no injury

EXAM:
THORACIC SPINE - 3 VIEWS

[thoracic spine ap]
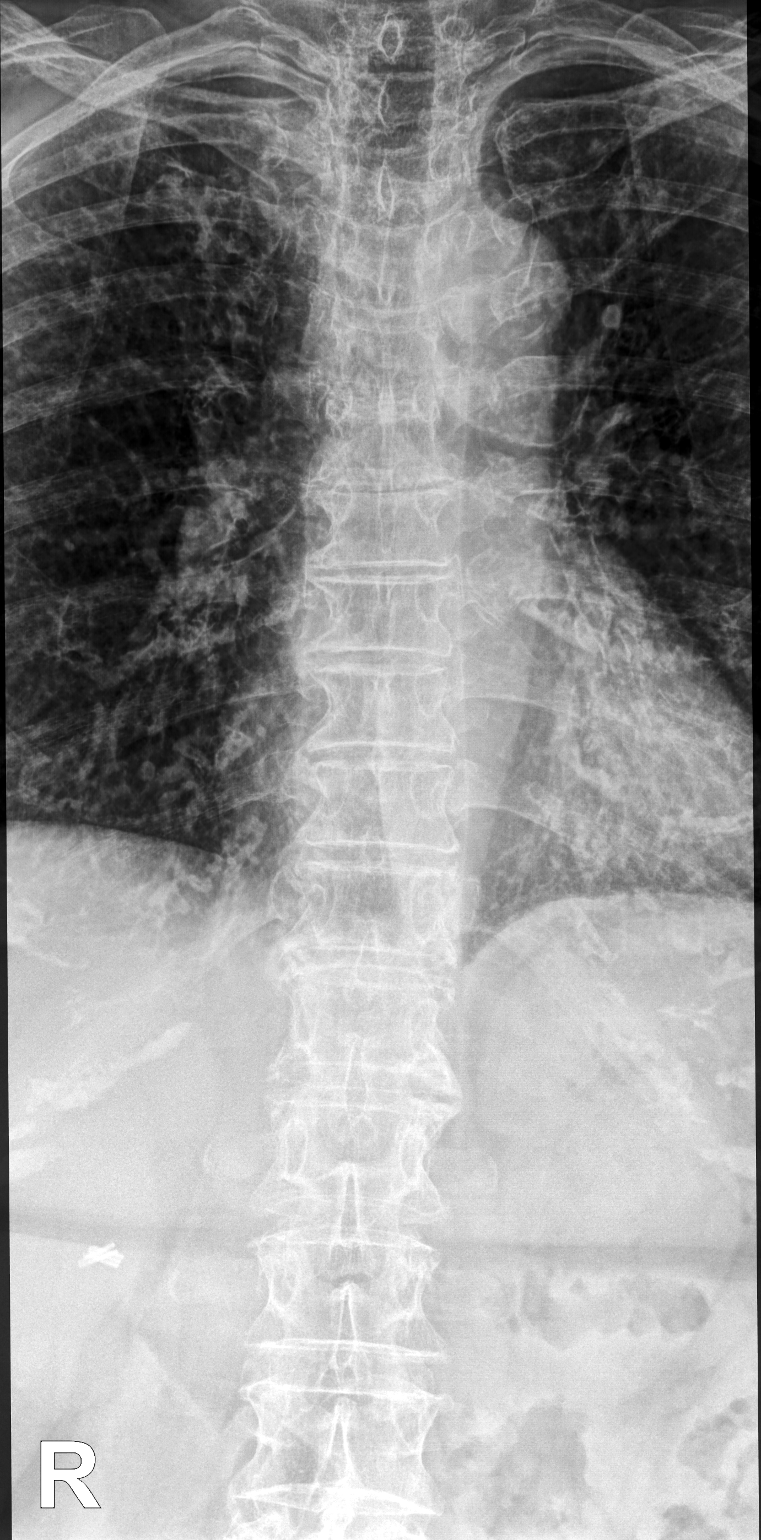

[thoracic spine lat]
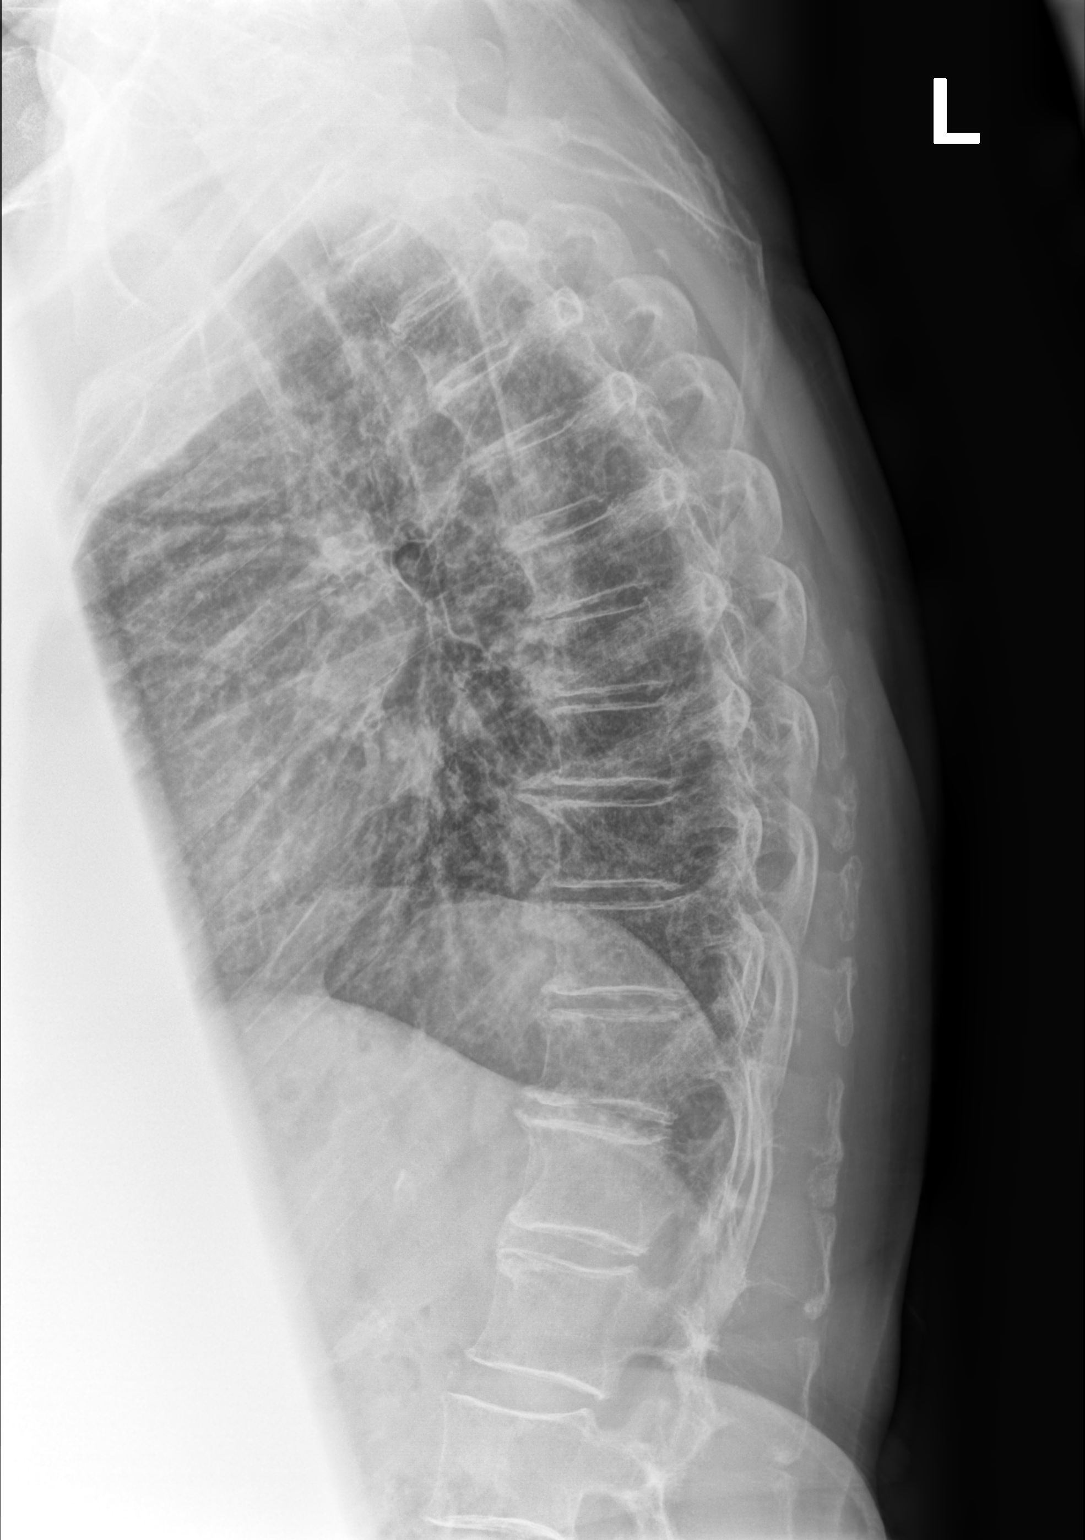

[2 of 2 positions shown; findings below may reference images not displayed]

FINDINGS: There is no evidence of thoracic spine fracture. Alignment is
normal. Mild to moderate multilevel disc space height loss and
osteophytosis throughout.
IMPRESSION: No fracture or dislocation of the thoracic spine. Mild to moderate
multilevel degenerative disc disease. Thoracic disc and neural
foraminal pathology may be further evaluated by MRI if indicated by
neurologically localizing signs and symptoms.

## 2021-10-06 DIAGNOSIS — M545 Low back pain, unspecified: Secondary | ICD-10-CM | POA: Diagnosis not present

## 2021-10-10 DIAGNOSIS — M545 Low back pain, unspecified: Secondary | ICD-10-CM | POA: Diagnosis not present

## 2021-10-11 ENCOUNTER — Other Ambulatory Visit: Payer: Self-pay

## 2021-10-12 ENCOUNTER — Encounter: Payer: Self-pay | Admitting: Family Medicine

## 2021-10-12 ENCOUNTER — Ambulatory Visit: Payer: Medicare PPO | Admitting: Family Medicine

## 2021-10-12 VITALS — BP 128/76 | HR 76 | Temp 97.6°F | Ht 64.0 in | Wt 162.6 lb

## 2021-10-12 DIAGNOSIS — M19042 Primary osteoarthritis, left hand: Secondary | ICD-10-CM | POA: Insufficient documentation

## 2021-10-12 DIAGNOSIS — S32010D Wedge compression fracture of first lumbar vertebra, subsequent encounter for fracture with routine healing: Secondary | ICD-10-CM | POA: Diagnosis not present

## 2021-10-12 DIAGNOSIS — Z872 Personal history of diseases of the skin and subcutaneous tissue: Secondary | ICD-10-CM

## 2021-10-12 DIAGNOSIS — M8589 Other specified disorders of bone density and structure, multiple sites: Secondary | ICD-10-CM

## 2021-10-12 DIAGNOSIS — M5116 Intervertebral disc disorders with radiculopathy, lumbar region: Secondary | ICD-10-CM

## 2021-10-12 DIAGNOSIS — M19041 Primary osteoarthritis, right hand: Secondary | ICD-10-CM

## 2021-10-12 DIAGNOSIS — R159 Full incontinence of feces: Secondary | ICD-10-CM

## 2021-10-12 HISTORY — DX: Full incontinence of feces: R15.9

## 2021-10-12 HISTORY — DX: Primary osteoarthritis, left hand: M19.041

## 2021-10-12 HISTORY — DX: Personal history of diseases of the skin and subcutaneous tissue: Z87.2

## 2021-10-12 LAB — SEDIMENTATION RATE: Sed Rate: 16 mm/hr (ref 0–30)

## 2021-10-12 LAB — C-REACTIVE PROTEIN: CRP: <1 mg/dL (ref 0.5–20.0)

## 2021-10-12 LAB — CBC
HCT: 39.5 % (ref 36.0–46.0)
Hemoglobin: 13.3 g/dL (ref 12.0–15.0)
MCHC: 33.7 g/dL (ref 30.0–36.0)
MCV: 91 fl (ref 78.0–100.0)
Platelets: 224 10*3/uL (ref 150.0–400.0)
RBC: 4.34 Mil/uL (ref 3.87–5.11)
RDW: 12.8 % (ref 11.5–15.5)
WBC: 4.9 10*3/uL (ref 4.0–10.5)

## 2021-10-12 MED ORDER — IBANDRONATE SODIUM 150 MG PO TABS: 150.0000 mg | ORAL_TABLET | ORAL | 3 refills | Status: DC

## 2021-10-12 NOTE — Progress Notes (Signed)
[facility] [address] [place] [address] Dept: [phone] Dept Fax: [phone]  Chronic Care Office Visit  Subjective:    Patient ID: Shelby Cardenas, female    DOB: 27-Jan-1941, 80 y.o..   MRN: [id number]  Chief Complaint  Patient presents with   Acute Visit    C/o having back/RT leg pain and numbness in LT leg.  She has had injections 08/2021, PT (12 weeks) with some relief, achy.     History of Present Illness:  Patient is in today for reassessment of chronic medical issues.  Shelby Cardenas has a history of an L1 compression fracture earlier this year. She had previously been identified as having osteopenia. She is on calcium with Vit. D. She underwent kyphoplasty by Dr. Senaida Ores in August, which gave her significant relief initially. Since then she has had issue with right lower back pain and left leg numbness. She finds the leg numbness is exacerbated by standing or walking for too long. She was seen by Dr. Maia Petties in Nov. for an ESI. She went back in early Dec. and saw a PA in their practice ([facility]). The PA outlined possible stepwise approaches, including nerve ablation. Shelby Cardenas feels hesitant about moving ahead with this.  Related to the osteopenia and her recent osteoporotic fracture, Shelby Cardenas was on alendronate many years ago. She apparently had some sort of intolerance of this.  Shelby Cardenas also notes a past history of lupus diagnosed by her dermatologist related to some skin lesions she had. She was treated initially with Plaquenil, but developed a rash with this. She drinks ionized water and feels it has helped with multiple complaints. She notes that her lupus seemed to go away with time. More recently, she has had more aches and pain in various joints and she wonders if this relates to lupus resurfacing.  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   Intervertebral disc disorder with radiculopathy of lumbar region 09/06/2021   Closed wedge compression fracture of first lumbar vertebra (HCC) 06/09/2021   OAB (overactive bladder) 11/12/2020   Irritable bowel syndrome with diarrhea 03/22/2020   Primary open angle glaucoma of both eyes, moderate stage 12/12/2018   Mammogram abnormal 07/16/2018   Cystocele with prolapse 07/02/2018   Glaucoma 02/06/2017   Adenomatous colon polyp 04/11/2016   Mixed hyperlipidemia 02/06/2016   Osteopenia 02/06/2016   GERD (gastroesophageal reflux disease) 02/06/2016   Diaphragmatic hernia 02/06/2016   Allergic rhinitis 02/06/2016   Past Surgical History:  Procedure Laterality Date   CESAREAN SECTION     CHOLECYSTECTOMY     KNEE SURGERY Left    SKIN CANCER EXCISION     TONSILLECTOMY     Family History  Problem Relation Age of Onset   Arthritis Mother    Arthritis Father    Breast cancer Daughter    Breast cancer Paternal Aunt    Outpatient Medications Prior to Visit  Medication Sig Dispense Refill   Alpha Lipoic Acid 200 MG CAPS Take by mouth.     Ascorbic Acid (VITAMIN C) 100 MG tablet Take 100 mg by mouth daily.     b complex vitamins capsule Take 1 capsule by mouth daily.     Calcium-Phosphorus-Vitamin D (CITRACAL +D3 PO) Take by mouth.     latanoprost (XALATAN) 0.005 % ophthalmic solution Place 1 drop into both eyes  at bedtime.     Multiple Vitamin (MULTIVITAMIN ADULT PO) Take by mouth.     psyllium (REGULOID) 0.52 g capsule Take 0.52 g by mouth daily.     Turmeric Curcumin 500 MG CAPS Take 1 capsule by mouth daily.     furosemide (LASIX) 20 MG tablet Take 1 tablet (20 mg total) by mouth daily. 14 tablet 0   pantoprazole (PROTONIX) 20 MG tablet 1tab BID x7days, then 1tab daily x 14days 30 tablet 0   No facility-administered medications prior to visit.   Allergies  Allergen Reactions   Macrobid  [Nitrofurantoin] Rash   Gabapentin Other (See Comments)    Other reaction(s): Other (See Comments) Dizzy and fell when taking     Prednisone     Other reaction(s): Other (See Comments), weird feeling Makes me feel weird    Tizanidine     Low bp   Tramadol Itching    Other reaction(s): unsure   Ciprofloxacin Other (See Comments)    Other reaction(s): weird feeling UNKNOWN UNKNOWN    Codeine Itching and Rash    Other reaction(s): itching, rash   Plaquenil [Hydroxychloroquine] Rash   Sulfa Antibiotics Itching and Rash    Objective:   Today's Vitals   10/12/21 1001  BP: 128/76  Pulse: 76  Temp: 97.6 F (36.4 C)  TempSrc: Temporal  SpO2: 98%  Weight: 162 lb 9.6 oz (73.8 kg)  Height: 5\' 4"  (1.626 m)   Body mass index is 27.91 kg/m.   General: Well developed, well nourished. No acute distress. Extremities: Multiple nodular changes with mild joints deformities for the DIP joints of both hands.. Skin: Warm and dry. No rashes. Neuro:CN II-XII intact. Normal sensation and DTR bilaterally. Psych: Alert and oriented x3. Normal mood and affect.  Health Maintenance Due  Topic Date Due   Zoster Vaccines- Shingrix (1 of 2) Never done   TETANUS/TDAP  03/04/2017   COVID-19 Vaccine (4 - Booster) 02/24/2020     Imaging:  DXA Scan (04/01/2021) ASSESSMENT: The BMD measured at [facility] L1-L4 is 1.001 g/cm2 with a T-score of -1.5. This patient is considered osteopenic/low bone mass according to [facility]) criteria.   The quality of the exam is good.   Site Region Measured Date Measured Age YA BMD Significant CHANGE T-score   AP Spine  L1-L4      04/01/2021    [age]         -1.5    1.001 g/cm2   DualFemur Total Left 4/01/2021    79.6         -0.7    0.922 g/cm2   DualFemur Total Mean 4/01/2021    79.6         -0.4    0.962 g/cm2  Assessment & Plan:   1. Intervertebral disc disorder with radiculopathy of lumbar region 2. Closed wedge compression fracture  of L1 vertebra with routine healing, subsequent encounter I agreed with Shelby Cardenas hesitancy to move ahead immediately with nerve ablation. I advised her to see about discussing this with Dr. Sherlyn Lick, including what other alternatives she may have and what are anticipated consequences of this.  3. Osteopenia of multiple sites Despite the T-score indicating only osteopenia of the lumbar spine, Ms. Fotheringham had an osteoporotic compression fracture. We discussed trying a different bisphosphonate to see if she would tolerate this. I believe this would be important for reducing risk of another fracture. She should continue her calcium and Vit. D.  - ibandronate (BONIVA)  150 MG tablet; Take 1 tablet (150 mg total) by mouth every 30 (thirty) days. Take in the morning with a full glass of water, on an empty stomach, and do not take anything else by mouth or lie down for the next 30 min.  Dispense: 3 tablet; Refill: 3  4. Primary osteoarthritis of both hands Exam of hands consistent with nodular changes from osteoarthritis. Her other joint complaints likely relate to this. She is working to remain active, which I support. She is taking some ibuprofen at bedtime. Tylenol has not be adequate. The benefits likely outweigh risk, so she can continue this.  5. History of discoid lupus erythematosus I will check labs to see if we can confirm the presence of absence of lupus.  - C-reactive protein - ANA - Sedimentation rate - CBC  Haydee Salter, MD

## 2021-10-13 DIAGNOSIS — M545 Low back pain, unspecified: Secondary | ICD-10-CM | POA: Diagnosis not present

## 2021-10-17 ENCOUNTER — Encounter: Payer: Self-pay | Admitting: Family Medicine

## 2021-10-17 DIAGNOSIS — R768 Other specified abnormal immunological findings in serum: Secondary | ICD-10-CM

## 2021-10-17 DIAGNOSIS — M545 Low back pain, unspecified: Secondary | ICD-10-CM | POA: Diagnosis not present

## 2021-10-17 HISTORY — DX: Other specified abnormal immunological findings in serum: R76.8

## 2021-10-17 LAB — ANTI-NUCLEAR AB-TITER (ANA TITER): ANA Titer 1: 1:40 {titer} — ABNORMAL HIGH

## 2021-10-17 LAB — ANA: Anti Nuclear Antibody (ANA): POSITIVE — AB

## 2021-10-18 DIAGNOSIS — M47816 Spondylosis without myelopathy or radiculopathy, lumbar region: Secondary | ICD-10-CM | POA: Diagnosis not present

## 2021-10-18 DIAGNOSIS — M5116 Intervertebral disc disorders with radiculopathy, lumbar region: Secondary | ICD-10-CM | POA: Diagnosis not present

## 2021-10-19 DIAGNOSIS — M545 Low back pain, unspecified: Secondary | ICD-10-CM | POA: Diagnosis not present

## 2021-10-25 DIAGNOSIS — M545 Low back pain, unspecified: Secondary | ICD-10-CM | POA: Diagnosis not present

## 2021-10-27 DIAGNOSIS — M545 Low back pain, unspecified: Secondary | ICD-10-CM | POA: Diagnosis not present

## 2021-11-01 DIAGNOSIS — M545 Low back pain, unspecified: Secondary | ICD-10-CM | POA: Diagnosis not present

## 2021-11-03 DIAGNOSIS — M545 Low back pain, unspecified: Secondary | ICD-10-CM | POA: Diagnosis not present

## 2021-11-08 DIAGNOSIS — M545 Low back pain, unspecified: Secondary | ICD-10-CM | POA: Diagnosis not present

## 2021-11-10 ENCOUNTER — Encounter: Payer: Self-pay | Admitting: Family Medicine

## 2021-11-10 DIAGNOSIS — M545 Low back pain, unspecified: Secondary | ICD-10-CM | POA: Diagnosis not present

## 2021-11-15 DIAGNOSIS — M545 Low back pain, unspecified: Secondary | ICD-10-CM | POA: Diagnosis not present

## 2021-11-17 DIAGNOSIS — M545 Low back pain, unspecified: Secondary | ICD-10-CM | POA: Diagnosis not present

## 2021-12-01 ENCOUNTER — Other Ambulatory Visit: Payer: Self-pay

## 2021-12-02 ENCOUNTER — Encounter: Payer: Self-pay | Admitting: Family Medicine

## 2021-12-02 ENCOUNTER — Telehealth: Payer: Self-pay | Admitting: Family Medicine

## 2021-12-02 ENCOUNTER — Ambulatory Visit (INDEPENDENT_AMBULATORY_CARE_PROVIDER_SITE_OTHER): Payer: Medicare PPO | Admitting: Family Medicine

## 2021-12-02 VITALS — BP 140/76 | HR 65 | Temp 96.5°F | Ht 64.0 in | Wt 163.4 lb

## 2021-12-02 DIAGNOSIS — M19041 Primary osteoarthritis, right hand: Secondary | ICD-10-CM | POA: Diagnosis not present

## 2021-12-02 DIAGNOSIS — R5383 Other fatigue: Secondary | ICD-10-CM

## 2021-12-02 DIAGNOSIS — M19042 Primary osteoarthritis, left hand: Secondary | ICD-10-CM | POA: Diagnosis not present

## 2021-12-02 DIAGNOSIS — R739 Hyperglycemia, unspecified: Secondary | ICD-10-CM

## 2021-12-02 DIAGNOSIS — Z23 Encounter for immunization: Secondary | ICD-10-CM

## 2021-12-02 DIAGNOSIS — M8589 Other specified disorders of bone density and structure, multiple sites: Secondary | ICD-10-CM

## 2021-12-02 DIAGNOSIS — M67449 Ganglion, unspecified hand: Secondary | ICD-10-CM | POA: Diagnosis not present

## 2021-12-02 LAB — GLUCOSE, RANDOM: Glucose, Bld: 90 mg/dL (ref 70–99)

## 2021-12-02 LAB — HEMOGLOBIN A1C: Hgb A1c MFr Bld: 6.1 % (ref 4.6–6.5)

## 2021-12-02 MED ORDER — ZOSTER VAC RECOMB ADJUVANTED 50 MCG/0.5ML IM SUSR: 0.5000 mL | Freq: Once | INTRAMUSCULAR | 0 refills | Status: DC

## 2021-12-02 NOTE — Telephone Encounter (Signed)
Pt called and said she was going to get her shingles shot based on what her and Dr Gena Fray discuss and she called [facility] [address] [place], [place] [address] and they said they need a order called in there in order to do it. Phone number [phone]. Please advise. Callback for pt is [phone]

## 2021-12-02 NOTE — Progress Notes (Signed)
[facility] [address] [place] [address] Dept: [phone] Dept Fax: [phone]  Chronic Care Office Visit  Subjective:    Patient ID: Shelby Cardenas, female    DOB: 09/05/1941, 81 y.o..   MRN: [id number]  Chief Complaint  Patient presents with   Follow-up    F/u meds.   Fasting today.  C/o having severe joint pain/swelling in hand, fatigue.  ? Bonivia   (unable to take).      History of Present Illness:  Patient is in today for reassessment of chronic medical issues.  Ms. 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 She notes she was previosuly seen by Dr. Amedeo Plenty related to a a mucous cyst of one of her fingers. She notes that her fingers are getting more swollen with time, to the point that she can no longer wear rings on her fingers. She also notes pain in the base of both thumbs.  Ms. Simerly has a history of osteopenia, but also an osteoporotic fracture of the lumbar spine. Ms. Nishida was on alendronate many years ago. She apparently had some sort of intolerance of this. I had switched her to [facility]. She notes that this seems to make her feel achy and give her a general feeling of not being well. Her mother had a history of progressive kyphosis. Related to her fracture,m her pain has improved. However, she does get intermittent sciatica with standing. This is intermittent.   Ms. Fischel complains of a generalized feeling of fatigue. She notes at times, she experiences some mild dyspnea with exertion (such as climbing stairs). She has noted some mild edema of her lower legs, but feels this has worsened. Ms. 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 She does admit to getting up tot he bathroom twice a night.  Past Medical History: Patient Active Problem List   Diagnosis Date Noted   Positive ANA (antinuclear antibody) 10/17/2021    Incontinence of feces 10/12/2021   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   Osteoarthritis of hands, bilateral 10/12/2021   Intervertebral disc disorder with radiculopathy of lumbar region 09/06/2021   Closed wedge compression fracture of first lumbar vertebra ([place]) 06/09/2021   OAB (overactive bladder) 11/12/2020   Irritable bowel syndrome with diarrhea 03/22/2020   Primary open angle glaucoma of both eyes, moderate stage 12/12/2018   Mammogram abnormal 07/16/2018   Cystocele with prolapse 07/02/2018   Adenomatous colon polyp 04/11/2016   Mixed hyperlipidemia 02/06/2016   Osteopenia 02/06/2016   GERD (gastroesophageal reflux disease) 02/06/2016   Diaphragmatic hernia 02/06/2016   Allergic rhinitis 02/06/2016   Past Surgical History:  Procedure Laterality Date   CESAREAN SECTION     CHOLECYSTECTOMY     KNEE SURGERY Left    NEUROPLASTY / TRANSPOSITION MEDIAN NERVE AT CARPAL TUNNEL  05/2021   Back   SKIN CANCER EXCISION     TONSILLECTOMY     Family History  Problem Relation Age of Onset   Arthritis Mother    Arthritis Father    Breast cancer Daughter    Breast cancer Paternal Aunt    Outpatient Medications Prior to Visit  Medication Sig Dispense Refill   Alpha Lipoic Acid 200 MG CAPS Take by mouth.     Ascorbic Acid (VITAMIN C) 100 MG tablet Take 100 mg by mouth daily.     b complex vitamins capsule Take 1 capsule by mouth daily.  Calcium-Phosphorus-Vitamin D (CITRACAL +D3 PO) Take by mouth.     latanoprost (XALATAN) 0.005 % ophthalmic solution Place 1 drop into both eyes at bedtime.     Multiple Vitamin (MULTIVITAMIN ADULT PO) Take by mouth.     psyllium (REGULOID) 0.52 g capsule Take 0.52 g by mouth daily.     Turmeric Curcumin 500 MG CAPS Take 1 capsule by mouth daily.     ibandronate (BONIVA) 150 MG tablet Take 1 tablet (150 mg total) by mouth every 30 (thirty) days. Take in the morning with a full glass of water, on an empty stomach, and do not take  anything else by mouth or lie down for the next 30 min. (Patient not taking: Reported on 12/02/2021) 3 tablet 3   No facility-administered medications prior to visit.   Allergies  Allergen Reactions   Macrobid [Nitrofurantoin] Rash   Gabapentin Other (See Comments)    Other reaction(s): Other (See Comments) Dizzy and fell when taking     Prednisone     Other reaction(s): Other (See Comments), weird feeling Makes me feel weird    Tizanidine     Low bp   Tramadol Itching    Other reaction(s): unsure   Ciprofloxacin Other (See Comments)    Other reaction(s): weird feeling UNKNOWN UNKNOWN    Codeine Itching and Rash    Other reaction(s): itching, rash   Plaquenil [Hydroxychloroquine] Rash   Sulfa Antibiotics Itching and Rash    Objective:   Today's Vitals   12/02/21 0859  BP: 140/76  Pulse: 65  Temp: (!) 96.5 F (35.8 C)  TempSrc: Temporal  SpO2: 98%  Weight: 163 lb 6.4 oz (74.1 kg)  Height: 5\' 4"  (1.626 m)   Body mass index is 28.05 kg/m.   General: Well developed, well nourished. No acute distress. HEENT: Normocephalic, non-traumatic. External ears normal. EAC and TMs normal bilaterally. PERRL,   EOMI. Conjunctiva clear. Nose clear without congestion or rhinorrhea. Mucous membranes moist.   Oropharynx clear. Good dentition. Neck: Supple. No lymphadenopathy. No thyromegaly. Lungs: Clear to auscultation bilaterally. No wheezing, rales or rhonchi. CV: RRR without murmurs or rubs. Pulses 2+ bilaterally. Abdomen: Soft, non-tender. Bowel sounds positive, normal pitch and frequency. No hepatosplenomegaly.   No rebound or guarding. Back: Mild kyphosis of the neck/upper thoracic spine. No tenderness to palpation. Extremities: Both hands show nodular changes to many of the PIP and DIP joints. There is a cyst over   the right 5th PIP joint. Compression stockings in place. Trace edema noted. Skin: Warm and dry. No rashes. Neuro: No focal neurological deficits. Psych: Alert  and oriented. Normal mood and affect.  Health Maintenance Due  Topic Date Due   Zoster Vaccines- Shingrix (1 of 2) Never done   TETANUS/TDAP  03/04/2017   COVID-19 Vaccine (3 - Booster for [employer] series) 01/31/2020   Lab Results Component Ref Range & Units 1 mo ago  ANA Titer 1 titer 1:40 High     Component Ref Range & Units 1 mo ago  Anti Nuclear Antibody (ANA) NEGATIVE POSITIVE Abnormal     Component Ref Range & Units 1 mo ago  Sed Rate 0 - 30 mm/hr 16    Component Ref Range & Units 1 mo ago  CRP 0.5 - 20.0 mg/dL <1.0    Last CBC Lab Results  Component Value Date   WBC 4.9 10/12/2021   HGB 13.3 10/12/2021   HCT 39.5 10/12/2021   MCV 91.0 10/12/2021   MCH 29.8 06/02/2021  RDW 12.8 10/12/2021   PLT 224.0 98/33/8250   Last metabolic panel Lab Results  Component Value Date   GLUCOSE 90 12/02/2021   NA 132 (L) 06/02/2021   K 4.0 06/02/2021   CL 98 06/02/2021   CO2 26 06/02/2021   BUN 14 06/02/2021   CREATININE 0.68 06/02/2021   GFRNONAA >60 06/02/2021   CALCIUM 9.1 06/02/2021   ANIONGAP 8 06/02/2021   BMP Latest Ref Rng & Units 12/02/2021 06/02/2021 04/06/2021  Glucose 70 - 99 mg/dL 90 128(H) 171(H)  BUN 8 - 23 mg/dL - 14 15  Creatinine 0.44 - 1.00 mg/dL - 0.68 0.70  Sodium 135 - 145 mmol/L - 132(L) 137  Potassium 3.5 - 5.1 mmol/L - 4.0 4.1  Chloride 98 - 111 mmol/L - 98 103  CO2 22 - 32 mmol/L - 26 25  Calcium 8.9 - 10.3 mg/dL - 9.1 9.1   Component Ref Range & Units 8 mo ago 1 yr ago  Vitamin B-12 211 - 911 pg/mL >1550 High   >1526 High     Component Ref Range & Units 8 mo ago 1 yr ago  Iron 45 - 160 mcg/dL 80  109   TIBC 250 - 450 mcg/dL (calc) 362  358   %SAT 16 - 45 % (calc) 22  30   Ferritin 16 - 288 ng/mL 66  61    Component Ref Range & Units 8 mo ago 1 yr ago  VITD 30.00 - 100.00 ng/mL 41.36  48.59      Assessment & Plan:   1. Osteopenia of multiple sites Ms. Sprenkle has not tolerated bisphophanates in the past. Although her last bone density  showed osteopenia she has had a lumbar compression fracture and has kyphosis. I feel she is a candidate to treat this to reduce risk of further fractures. I will refer her to endocrinology for a review and management recommendations, wondering if she might be a candidate for Prolia.  - Ambulatory referral to Endocrinology  2. Primary osteoarthritis of both hands 3. Mucous cyst of finger Ms. Hogston has progressive Heberden's and Bouchard's nodes of most fingers of both hands, as well as a mucous cyst. She also appears to have arthritis in the 1st MCP joint bilaterally. I will refer her to Dr. Amedeo Plenty to consider if there are options other than ongoign conservative management.  - Ambulatory referral to Orthopedic Surgery  4. Fatigue, unspecified type Ms. Gladman has had extensive work-up over the past year with lab tests and exams that are not indicative of an underlying metabolic, cardiac, neurologic, hematologic, or rheumatic cause for her fatigue. This may be due to normal aging, but I did tell her we would continue to monitor for development of other symptoms that might guide our approach. I did urge her to remain active.  5. Hyperglycemia Ms. Corado has had some elevated blood sugars int he past and is having some mild nocturia. I will check a blood glucose and A1c to screen for diabetes.  - Glucose, random - Hemoglobin A1c   Haydee Salter, MD

## 2021-12-02 NOTE — Telephone Encounter (Signed)
Can you place an order for the shingles vaccine tot he pharmacy for patient? Thanks.  Dm/cma

## 2021-12-02 NOTE — Patient Instructions (Signed)
Talk to pharmacist about a tetanus booster (Td) and shingles vaccination (Shingrix).

## 2021-12-03 ENCOUNTER — Encounter: Payer: Self-pay | Admitting: Family Medicine

## 2021-12-05 MED ORDER — ZOSTER VAC RECOMB ADJUVANTED 50 MCG/0.5ML IM SUSR: 0.5000 mL | Freq: Once | INTRAMUSCULAR | 0 refills | Status: AC

## 2021-12-05 MED ORDER — ZOSTER VAC RECOMB ADJUVANTED 50 MCG/0.5ML IM SUSR: 0.5000 mL | Freq: Once | INTRAMUSCULAR | 0 refills | Status: DC

## 2021-12-05 NOTE — Telephone Encounter (Signed)
Spoke to pharmacy and they require an RX for insurance purposes and they received 2 RX for her.  Message sent to patient through my chart. Dm/cma

## 2021-12-05 NOTE — Addendum Note (Signed)
Addended by: Konrad Saha on: 12/05/2021 09:22 AM   Modules accepted: Orders

## 2021-12-05 NOTE — Addendum Note (Signed)
Addended by: Armandina Gemma L on: 12/05/2021 09:01 AM   Modules accepted: Orders

## 2021-12-05 NOTE — Addendum Note (Signed)
Addended by: Konrad Saha on: 12/05/2021 08:59 AM   Modules accepted: Orders

## 2022-01-02 ENCOUNTER — Encounter: Payer: Self-pay | Admitting: Internal Medicine

## 2022-01-02 ENCOUNTER — Telehealth: Payer: Self-pay | Admitting: Internal Medicine

## 2022-01-02 ENCOUNTER — Other Ambulatory Visit: Payer: Self-pay

## 2022-01-02 ENCOUNTER — Ambulatory Visit: Payer: Medicare PPO | Admitting: Internal Medicine

## 2022-01-02 VITALS — BP 132/86 | HR 92 | Wt 164.0 lb

## 2022-01-02 DIAGNOSIS — M810 Age-related osteoporosis without current pathological fracture: Secondary | ICD-10-CM

## 2022-01-02 DIAGNOSIS — M8080XS Other osteoporosis with current pathological fracture, unspecified site, sequela: Secondary | ICD-10-CM

## 2022-01-02 DIAGNOSIS — R946 Abnormal results of thyroid function studies: Secondary | ICD-10-CM

## 2022-01-02 DIAGNOSIS — R7989 Other specified abnormal findings of blood chemistry: Secondary | ICD-10-CM | POA: Diagnosis not present

## 2022-01-02 LAB — BASIC METABOLIC PANEL
BUN: 16 mg/dL (ref 6–23)
CO2: 26 mEq/L (ref 19–32)
Calcium: 9.2 mg/dL (ref 8.4–10.5)
Chloride: 100 mEq/L (ref 96–112)
Creatinine, Ser: 0.73 mg/dL (ref 0.40–1.20)
GFR: 77.67 mL/min (ref >60.00)
Glucose, Bld: 83 mg/dL (ref 70–99)
Potassium: 4.4 mEq/L (ref 3.5–5.1)
Sodium: 136 mEq/L (ref 135–145)

## 2022-01-02 LAB — VITAMIN D 25 HYDROXY (VIT D DEFICIENCY, FRACTURES): VITD: 36.27 ng/mL (ref 30.00–100.00)

## 2022-01-02 LAB — TSH: TSH: 3.18 u[IU]/mL (ref 0.35–5.50)

## 2022-01-02 LAB — ALBUMIN: Albumin: 4.5 g/dL (ref 3.5–5.2)

## 2022-01-02 LAB — T4, FREE: Free T4: 0.59 ng/dL — ABNORMAL LOW (ref 0.60–1.60)

## 2022-01-02 NOTE — Progress Notes (Signed)
? ? ?Name: Shelby Cardenas  ?MRN/ DOB: [id number], 1941-01-14    ?Age/ Sex: 81 y.o., female   ? ?PCP: Haydee Salter, MD   ?Reason for Endocrinology Evaluation: Low bone density   ?   ?Date of Initial Endocrinology Evaluation: 01/02/2022   ? ? ?HPI: ?Shelby Cardenas is a 81 y.o. female with a past medical history of Low bone density, GERD, IBS, and discoid lupus ( Dx 2012)   The patient presented for initial endocrinology clinic visit on 01/02/2022 for consultative assistance with her Low bone density .  ? ?Pt was diagnosed with osteopenia:03/2021  ? ?Menarche at age : 31 ?Menopausal at age : late 52's  ?Fracture Hx: yes , at the L1  on MRI  ?Hx of HRT: yes but did not tolerate  ?FH of osteoporosis or hip fracture: not known  ?Prior Hx of anti-estrogenic therapy :no  ?Prior Hx of anti-resorptive therapy : Alendronate , Boniva - achy, headaches and not well  ? ? ? ?Of note, the pt has positive ANA  ? ?She was on a brace for back pain, she is S/P neuroplasty  ?No prior exposure to radiation  ?No hx of cancer other then skin cancer  ?Has multiple arthralgia  ?Has fatigue with exertional dyspnea  ?No diarrhea but on occasions has to manually disimpact as a complication of  a bladder sx  ? ?She is on Citracal - Vit D ( 650 /1000 iu ) BID  ?Whole produce fruits  3 tabs daily  ?Whole produce veggies 3 tabs daily  ? ? ? ? ?HISTORY:  ?Past Medical History:  ?Past Medical History:  ?Diagnosis Date  ? GERD (gastroesophageal reflux disease)   ? Lupus ([place])   ? ?Past Surgical History:  ?Past Surgical History:  ?Procedure Laterality Date  ? CESAREAN SECTION    ? CHOLECYSTECTOMY    ? KNEE SURGERY Left   ? NEUROPLASTY / TRANSPOSITION MEDIAN NERVE AT CARPAL TUNNEL  05/2021  ? Back  ? SKIN CANCER EXCISION    ? TONSILLECTOMY    ?  ?Social History:  reports that she has never smoked. She has never used smokeless tobacco. She reports that she does not currently use alcohol. She reports that she does not use drugs. ?Family History: family  history includes Arthritis in her father and mother; Breast cancer in her daughter and paternal aunt. ? ? ?HOME MEDICATIONS: ?Allergies as of 01/02/2022   ? ?   Reactions  ? Macrobid [nitrofurantoin] Rash  ? Gabapentin Other (See Comments)  ? Other reaction(s): Other (See Comments) ?Dizzy and fell when taking   ? Prednisone   ? Other reaction(s): Other (See Comments), weird feeling ?Makes me feel weird  ? Tizanidine   ? Low bp  ? Tramadol Itching  ? Other reaction(s): unsure  ? Ciprofloxacin Other (See Comments)  ? Other reaction(s): weird feeling ?UNKNOWN ?UNKNOWN  ? Codeine Itching, Rash  ? Other reaction(s): itching, rash  ? Plaquenil [hydroxychloroquine] Rash  ? Sulfa Antibiotics Itching, Rash  ? ?  ? ?  ?Medication List  ?  ? ?  ? Accurate as of January 02, 2022 10:12 AM. If you have any questions, ask your nurse or doctor.  ?  ?  ? ?  ? ?Alpha Lipoic Acid 200 MG Caps ?Take by mouth. ?  ?b complex vitamins capsule ?Take 1 capsule by mouth daily. ?  ?CITRACAL +D3 PO ?Take by mouth. ?  ?latanoprost 0.005 % ophthalmic solution ?Commonly known as: XALATAN ?  Place 1 drop into both eyes at bedtime. ?  ?MULTIVITAMIN ADULT PO ?Take by mouth. ?  ?psyllium 0.52 g capsule ?Commonly known as: REGULOID ?Take 0.52 g by mouth daily. ?  ?Turmeric Curcumin 500 MG Caps ?Take 1 capsule by mouth daily. ?  ?vitamin C 100 MG tablet ?Take 100 mg by mouth daily. ?  ? ?  ?  ? ? ?REVIEW OF SYSTEMS: ?A comprehensive ROS was conducted with the patient and is negative except as per HPI  ? ? ? ?OBJECTIVE:  ?VS: BP 132/86 (BP Location: Left Arm, Patient Position: Sitting, Cuff Size: Small)   Pulse 92   Wt 164 lb (74.4 kg)   SpO2 99%   BMI 28.15 kg/m?   ? ?Wt Readings from Last 3 Encounters:  ?01/02/22 164 lb (74.4 kg)  ?12/02/21 163 lb 6.4 oz (74.1 kg)  ?10/12/21 162 lb 9.6 oz (73.8 kg)  ? ? ? ?EXAM: ?General: Pt appears well and is in NAD  ?Eyes: External eye exam normal without stare, lid lag or exophthalmos.  EOM intact.    ?Neck: General:  Supple without adenopathy. ?Thyroid: Thyroid size normal.  No goiter or nodules appreciated. No thyroid bruit.  ?Lungs: Clear with good BS bilat with no rales, rhonchi, or wheezes  ?Heart: Auscultation: RRR.  ?Abdomen: Normoactive bowel sounds, soft, nontender, without masses or organomegaly palpable  ?Extremities:  ?BL LE: No pretibial edema normal ROM and strength.  ?Mental Status: Judgment, insight: Intact ?Orientation: Oriented to time, place, and person ?Mood and affect: No depression, anxiety, or agitation  ? ? ? ?DATA REVIEWED: ? Latest Reference Range & Units 01/02/22 10:26  ?Sodium 135 - 145 mEq/L 136  ?Potassium 3.5 - 5.1 mEq/L 4.4  ?Chloride 96 - 112 mEq/L 100  ?CO2 19 - 32 mEq/L 26  ?Glucose 70 - 99 mg/dL 83  ?BUN 6 - 23 mg/dL 16  ?Creatinine 0.40 - 1.20 mg/dL 0.73  ?Calcium 8.4 - 10.5 mg/dL 9.2  ?Albumin 3.5 - 5.2 g/dL 4.5  ?GFR >60.00 mL/min 77.67  ?VITD 30.00 - 100.00 ng/mL 36.27  ? ? Latest Reference Range & Units 01/02/22 10:26  ?TSH 0.35 - 5.50 uIU/mL 3.18  ?T4,Free(Direct) 0.60 - 1.60 ng/dL 0.59 (L)  ? ? ? ? ?DXA 04/01/2021 ? ? The BMD measured at [facility] Spine L1-L4 is 1.001 g/cm2 with a T-score of ?-1.5. This patient is considered osteopenic/low bone mass according ?to [facility]) criteria. ?  ?The quality of the exam is good. ?  ?Site Region Measured Date Measured Age YA BMD Significant CHANGE ?T-score ?  ?AP Spine  L1-L4      04/01/2021    79.6         -1.5    1.001 g/cm2 ?  ?DualFemur Total Left 04/01/2021    79.6         -0.7    0.922 g/cm2 ?  ?DualFemur Total Mean 04/01/2021    79.6         -0.4    0.962 g/cm2 ? ? ?FRAX ?Major Osteoporotic Fracture: 13.2% ?Hip Fracture:                1.4% ?  ?MRI Spine 07/22/2021 ? ?Incomplete healing (continued marrow edema) at the L1 compression ?fracture with 40% height loss. ?2. No new fracture or neural compression. ? ?ASSESSMENT/PLAN/RECOMMENDATIONS:  ? ?Osteoporosis  : ? ? ? ?- Pt meets clinical diagnosis of Osteoporosis given fragility  compression fracture of the spine  ?- She is  intolerant to Alendronate and Ibandronate  ?- Pt declines PTH- analogues ( forteo/Tymlos) , she understands this is the first line of therapy for fragility fracture but is not keen on daily injections . ?- Pt would like to proceed with Prolia  ?- Emphasized importance of calcium/Vitamin D intake and weight bearing exercises.  ? ?Medications : ?Continue Citracal 650 mg twice daily  ?Prolia 60 mg SQ every 6 months ? ?2.  Low free T4: ? ? ?-With TSH being normal, suspect assay interference, we will continue to monitor and intervene when necessary ? ? ? ?F/U in 6 months  ? ?Signed electronically by: ?Abby Nena Jordan, MD ? ?[facility]  ?[facility] ?[address] ?[place], [place] [address] ?Phone: [phone] ?FAX: [phone] ? ? ?CC: ?Rudd, Lillette Boxer, MD ?[place] ?[place] [address] ?Phone: [phone] ?Fax: [phone] ? ? ?Return to Endocrinology clinic as below: ?Future Appointments  ?Date Time Provider [place]  ?06/02/2022 10:00 AM Rudd, Lillette Boxer, MD [id number]  ?  ? ? ? ? ? ?

## 2022-01-02 NOTE — Telephone Encounter (Signed)
Brandy,  ? ?Can you please add her to the Prolia list ? ? ? ? ?Thanks  ?

## 2022-01-02 NOTE — Patient Instructions (Addendum)
Continue Citracal 650 mg Twice daily   Weight bearing exercises help with bone health  

## 2022-01-03 ENCOUNTER — Encounter: Payer: Self-pay | Admitting: Internal Medicine

## 2022-01-03 LAB — PARATHYROID HORMONE, INTACT (NO CA): PTH: 19 pg/mL (ref 16–77)

## 2022-01-07 NOTE — Telephone Encounter (Signed)
Prolia VOB initiated via [email or web address] ? ?New start ? ?

## 2022-01-11 NOTE — Telephone Encounter (Signed)
Prior auth required for PROLIA ? ?PA PROCESS DETAILS: PA is required. PA can be initiated by calling [phone] or online at ?[email or web address] ? ?

## 2022-01-12 NOTE — Telephone Encounter (Signed)
Prior auth initiated via [email or web address] ?KEY: [id number] ? ? ? ?

## 2022-01-13 ENCOUNTER — Encounter: Payer: Self-pay | Admitting: Family Medicine

## 2022-01-14 NOTE — Telephone Encounter (Signed)
Prior auth APPROVED ?KEY: [id number] ?PA#: [id number] ? ? ? ? ?

## 2022-01-14 NOTE — Telephone Encounter (Signed)
Pt ready for scheduling on or after 01/12/22 ? ?Out-of-pocket cost due at time of visit: $0 ? ?Primary: [employer] ?Prolia co-insurance: 0% ?Admin fee co-insurance: 0% ? ?Secondary: n/a ?Prolia co-insurance:  ?Admin fee co-insurance:  ? ?Deductible: does not apply ? ?Prior Auth: APPROVED ?PA# [id number] ?Valid: 01/12/22-10/29/22 ?  ? ?** This summary of benefits is an estimation of the patient's out-of-pocket cost. Exact cost may very based on individual plan coverage.  ? ?

## 2022-01-19 DIAGNOSIS — G629 Polyneuropathy, unspecified: Secondary | ICD-10-CM | POA: Diagnosis not present

## 2022-01-19 DIAGNOSIS — G8929 Other chronic pain: Secondary | ICD-10-CM | POA: Diagnosis not present

## 2022-01-19 DIAGNOSIS — M545 Low back pain, unspecified: Secondary | ICD-10-CM | POA: Diagnosis not present

## 2022-01-19 DIAGNOSIS — R32 Unspecified urinary incontinence: Secondary | ICD-10-CM | POA: Diagnosis not present

## 2022-01-19 DIAGNOSIS — H409 Unspecified glaucoma: Secondary | ICD-10-CM | POA: Diagnosis not present

## 2022-01-19 DIAGNOSIS — Z833 Family history of diabetes mellitus: Secondary | ICD-10-CM | POA: Diagnosis not present

## 2022-01-19 DIAGNOSIS — M1990 Unspecified osteoarthritis, unspecified site: Secondary | ICD-10-CM | POA: Diagnosis not present

## 2022-01-19 DIAGNOSIS — R030 Elevated blood-pressure reading, without diagnosis of hypertension: Secondary | ICD-10-CM | POA: Diagnosis not present

## 2022-01-19 DIAGNOSIS — Z803 Family history of malignant neoplasm of breast: Secondary | ICD-10-CM | POA: Diagnosis not present

## 2022-01-23 ENCOUNTER — Ambulatory Visit: Payer: Medicare PPO | Admitting: Family Medicine

## 2022-01-23 ENCOUNTER — Encounter: Payer: Self-pay | Admitting: Family Medicine

## 2022-01-23 VITALS — BP 110/72 | HR 70 | Temp 96.5°F | Ht 64.0 in | Wt 166.6 lb

## 2022-01-23 DIAGNOSIS — M79671 Pain in right foot: Secondary | ICD-10-CM

## 2022-01-23 DIAGNOSIS — R5383 Other fatigue: Secondary | ICD-10-CM | POA: Diagnosis not present

## 2022-01-23 DIAGNOSIS — G8929 Other chronic pain: Secondary | ICD-10-CM | POA: Diagnosis not present

## 2022-01-23 DIAGNOSIS — M79672 Pain in left foot: Secondary | ICD-10-CM | POA: Diagnosis not present

## 2022-01-23 DIAGNOSIS — M25562 Pain in left knee: Secondary | ICD-10-CM

## 2022-01-23 NOTE — Progress Notes (Signed)
?[facility] ?[facility] ?[place] ?[place] [address] ?Dept: [phone] ?Dept Fax: [phone] ? ?Office Visit ? ?Subjective:  ? ? Patient ID: Shelby Cardenas, female    DOB: August 09, 1941, 81 y.o..   MRN: [id number] ? ?Chief Complaint  ?Patient presents with  ? Acute Visit  ?  C/o feeling fatigue/no energy x months.   Still having joint pain and numbness in back.     ? ? ?History of Present Illness: ? ?Patient is in today for discussion of ongoing concerns for fatigue. This has been going on for over 1 month. [name] does identify that back pain is at least one contributor to this. She had a spinal compression fracture last year and does get low back pain, sciatica, and numbness in there legs. She has undergone ESI and does not want to pursue more of this. She does stretches int he bed, which does help some. However, she notes that when she get up in the morning, she feels rested initially, but within 2 hours feels very fatigued. She awakens about every 3 hours at night. She thinks this may be due to pain, though she does get up to urinate when she wakes up. She notes some left knee pain, which has been due to osteoarthritis and meniscal injury int he past. She also complains of some pain in the soles of her feet when she gets up at night. She has no history of snoring. She and her husband sleep in separate bedrooms. She gets out to walk daily and interacts with her neighbors readily. ? ?Past Medical History: ?Patient Active Problem List  ? Diagnosis Date Noted  ? Positive ANA (antinuclear antibody) 10/17/2021  ? Incontinence of feces 10/12/2021  ? 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  ? Osteoarthritis of hands, bilateral 10/12/2021  ? Intervertebral disc disorder with radiculopathy of lumbar region 09/06/2021  ? Closed wedge compression fracture of first lumbar vertebra ([place]) 06/09/2021  ? OAB (overactive bladder) 11/12/2020  ? Irritable bowel  syndrome with diarrhea 03/22/2020  ? Primary open angle glaucoma of both eyes, moderate stage 12/12/2018  ? Mammogram abnormal 07/16/2018  ? Cystocele with prolapse 07/02/2018  ? Adenomatous colon polyp 04/11/2016  ? Mixed hyperlipidemia 02/06/2016  ? Osteopenia 02/06/2016  ? GERD (gastroesophageal reflux disease) 02/06/2016  ? Diaphragmatic hernia 02/06/2016  ? Allergic rhinitis 02/06/2016  ? ?Past Surgical History:  ?Procedure Laterality Date  ? CESAREAN SECTION    ? CHOLECYSTECTOMY    ? KNEE SURGERY Left   ? NEUROPLASTY / TRANSPOSITION MEDIAN NERVE AT CARPAL TUNNEL  05/2021  ? Back  ? SKIN CANCER EXCISION    ? TONSILLECTOMY    ? ?Family History  ?Problem Relation Age of Onset  ? Arthritis Mother   ? Arthritis Father   ? Breast cancer Daughter   ? Breast cancer Paternal Aunt   ? ?Outpatient Medications Prior to Visit  ?Medication Sig Dispense Refill  ? Alpha Lipoic Acid 200 MG CAPS Take by mouth.    ? Ascorbic Acid (VITAMIN C) 100 MG tablet Take 100 mg by mouth daily.    ? b complex vitamins capsule Take 1 capsule by mouth daily.    ? Calcium-Phosphorus-Vitamin D (CITRACAL +D3 PO) Take by mouth.    ? latanoprost (XALATAN) 0.005 % ophthalmic solution Place 1 drop into both eyes at bedtime.    ? Multiple Vitamin (MULTIVITAMIN ADULT PO) Take by mouth.    ? psyllium (REGULOID) 0.52 g capsule Take 0.52 g  by mouth daily.    ? Turmeric Curcumin 500 MG CAPS Take 1 capsule by mouth daily.    ? ?No facility-administered medications prior to visit.  ? ?Allergies  ?Allergen Reactions  ? Macrobid [Nitrofurantoin] Rash  ? Gabapentin Other (See Comments)  ?  Other reaction(s): Other (See Comments) ?Dizzy and fell when taking  ?  ? Prednisone   ?  Other reaction(s): Other (See Comments), weird feeling ?Makes me feel weird ?  ? Tizanidine   ?  Low bp  ? Tramadol Itching  ?  Other reaction(s): unsure  ? Ciprofloxacin Other (See Comments)  ?  Other reaction(s): weird feeling ?UNKNOWN ?UNKNOWN ?  ? Codeine Itching and Rash  ?  Other  reaction(s): itching, rash  ? Plaquenil [Hydroxychloroquine] Rash  ? Sulfa Antibiotics Itching and Rash  ?  ?Objective:  ? ?Today's Vitals  ? 01/23/22 1417  ?BP: 110/72  ?Pulse: 70  ?Temp: (!) 96.5 ?F (35.8 ?C)  ?TempSrc: Temporal  ?SpO2: 96%  ?Weight: 166 lb 9.6 oz (75.6 kg)  ?Height: '5\' 4"'$  (1.626 m)  ? ?Body mass index is 28.6 kg/m?.  ? ?General: Well developed, well nourished. No acute distress. ?Psych: Alert and oriented. Normal mood and affect. ? ?There are no preventive care reminders to display for this patient. ? ?Lab Results ?Last CBC ?Lab Results  ?Component Value Date  ? WBC 4.9 10/12/2021  ? HGB 13.3 10/12/2021  ? HCT 39.5 10/12/2021  ? MCV 91.0 10/12/2021  ? MCH 29.8 06/02/2021  ? RDW 12.8 10/12/2021  ? PLT 224.0 10/12/2021  ? ?Last metabolic panel ?Lab Results  ?Component Value Date  ? GLUCOSE 83 01/02/2022  ? NA 136 01/02/2022  ? K 4.4 01/02/2022  ? CL 100 01/02/2022  ? CO2 26 01/02/2022  ? BUN 16 01/02/2022  ? CREATININE 0.73 01/02/2022  ? GFRNONAA >60 06/02/2021  ? CALCIUM 9.2 01/02/2022  ? ALBUMIN 4.5 01/02/2022  ? ANIONGAP 8 06/02/2021  ? ?Last hemoglobin A1c ?Lab Results  ?Component Value Date  ? HGBA1C 6.1 12/02/2021  ? ?Last thyroid functions ?Lab Results  ?Component Value Date  ? TSH 3.18 01/02/2022  ? ?Component Ref Range & Units 3 wk ago  ?Free T4 0.60 - 1.60 ng/dL 0.59 Low    ? ?Last vitamin D ?Lab Results  ?Component Value Date  ? VD25OH 36.27 01/02/2022  ?  ?Component Ref Range & Units 3 mo ago  ?Sed Rate 0 - 30 mm/hr 16   ? ?Component Ref Range & Units 3 mo ago  ?CRP 0.5 - 20.0 mg/dL <1.0   ? ?Assessment & Plan:  ? ?1. Fatigue, unspecified type ?The etiology of Ms. Saunder's fatigue is not clear. She has no sign of anemia, renal or liver disease. Her T4 is mildly low with a normal TSH, which could presage hypothyroidism, but I don't think this fully explains things. She is no obviously depressed and does not have clear signs of OSA. I suspect her pain may be the issue that is interfering  with her sleep. ? ?2. Chronic pain of left knee ?We discussed the use of topical Voltaren gel to see if this would improve her knee pain. We also discussed using Tylenol 500 mg 2 tabs at bedtime. I am hopeful that if we can improve her pain, this will improve sleep,a dn thus her fatigue. ? ?3. Bilateral foot pain ?Consider seeing a podiatrist about possible plantar fasciitis. ? ?Return in about 2 months (around 03/25/2022).  ? ?Haydee Salter,  MD ?

## 2022-01-23 NOTE — Patient Instructions (Signed)
Use Tylenol 500 mg two (2) capsules or tabs at bedtime. ?Consider rubbing Voltaren (diclofenac) gel on left knee 2-3 times a day ?

## 2022-01-25 DIAGNOSIS — H401132 Primary open-angle glaucoma, bilateral, moderate stage: Secondary | ICD-10-CM | POA: Diagnosis not present

## 2022-01-25 DIAGNOSIS — H0100B Unspecified blepharitis left eye, upper and lower eyelids: Secondary | ICD-10-CM | POA: Diagnosis not present

## 2022-01-25 DIAGNOSIS — H0100A Unspecified blepharitis right eye, upper and lower eyelids: Secondary | ICD-10-CM | POA: Diagnosis not present

## 2022-01-25 DIAGNOSIS — Z961 Presence of intraocular lens: Secondary | ICD-10-CM | POA: Diagnosis not present

## 2022-01-25 DIAGNOSIS — H26491 Other secondary cataract, right eye: Secondary | ICD-10-CM | POA: Diagnosis not present

## 2022-01-25 DIAGNOSIS — H04123 Dry eye syndrome of bilateral lacrimal glands: Secondary | ICD-10-CM | POA: Diagnosis not present

## 2022-01-31 ENCOUNTER — Ambulatory Visit: Payer: Medicare PPO

## 2022-02-01 ENCOUNTER — Ambulatory Visit (INDEPENDENT_AMBULATORY_CARE_PROVIDER_SITE_OTHER): Payer: Medicare PPO

## 2022-02-01 DIAGNOSIS — Z0000 Encounter for general adult medical examination without abnormal findings: Secondary | ICD-10-CM | POA: Diagnosis not present

## 2022-02-01 NOTE — Patient Instructions (Signed)
Shelby Cardenas , ?Thank you for taking time to come for your Medicare Wellness Visit. I appreciate your ongoing commitment to your health goals. Please review the following plan we discussed and let me know if I can assist you in the future.  ? ?Screening recommendations/referrals: ?Colonoscopy: no longer required  ?Mammogram: no longer required  ?Bone Density: 04/01/2021 ?Recommended yearly ophthalmology/optometry visit for glaucoma screening and checkup ?Recommended yearly dental visit for hygiene and checkup ? ?Vaccinations: ?Influenza vaccine: completed  ?Pneumococcal vaccine: completed  ?Tdap vaccine: due  ?Shingles vaccine: completed    ? ?Advanced directives: yes  ? ?Conditions/risks identified: none  ? ?Next appointment: none  ? ? ?Preventive Care 81 Years and Older, Female ?Preventive care refers to lifestyle choices and visits with your health care provider that can promote health and wellness. ?What does preventive care include? ?A yearly physical exam. This is also called an annual well check. ?Dental exams once or twice a year. ?Routine eye exams. Ask your health care provider how often you should have your eyes checked. ?Personal lifestyle choices, including: ?Daily care of your teeth and gums. ?Regular physical activity. ?Eating a healthy diet. ?Avoiding tobacco and drug use. ?Limiting alcohol use. ?Practicing safe sex. ?Taking low-dose aspirin every day. ?Taking vitamin and mineral supplements as recommended by your health care provider. ?What happens during an annual well check? ?The services and screenings done by your health care provider during your annual well check will depend on your age, overall health, lifestyle risk factors, and family history of disease. ?Counseling  ?Your health care provider may ask you questions about your: ?Alcohol use. ?Tobacco use. ?Drug use. ?Emotional well-being. ?Home and relationship well-being. ?Sexual activity. ?Eating habits. ?History of falls. ?Memory and  ability to understand (cognition). ?Work and work Statistician. ?Reproductive health. ?Screening  ?You may have the following tests or measurements: ?Height, weight, and BMI. ?Blood pressure. ?Lipid and cholesterol levels. These may be checked every 5 years, or more frequently if you are over 81 years old. ?Skin check. ?Lung cancer screening. You may have this screening every year starting at age 81 if you have a 30-pack-year history of smoking and currently smoke or have quit within the past 15 years. ?Fecal occult blood test (FOBT) of the stool. You may have this test every year starting at age 81. ?Flexible sigmoidoscopy or colonoscopy. You may have a sigmoidoscopy every 5 years or a colonoscopy every 10 years starting at age 1. ?Hepatitis C blood test. ?Hepatitis B blood test. ?Sexually transmitted disease (STD) testing. ?Diabetes screening. This is done by checking your blood sugar (glucose) after you have not eaten for a while (fasting). You may have this done every 1-3 years. ?Bone density scan. This is done to screen for osteoporosis. You may have this done starting at age 81. ?Mammogram. This may be done every 1-2 years. Talk to your health care provider about how often you should have regular mammograms. ?Talk with your health care provider about your test results, treatment options, and if necessary, the need for more tests. ?Vaccines  ?Your health care provider may recommend certain vaccines, such as: ?Influenza vaccine. This is recommended every year. ?Tetanus, diphtheria, and acellular pertussis (Tdap, Td) vaccine. You may need a Td booster every 10 years. ?Zoster vaccine. You may need this after age 81. ?Pneumococcal 13-valent conjugate (PCV13) vaccine. One dose is recommended after age 38. ?Pneumococcal polysaccharide (PPSV23) vaccine. One dose is recommended after age 37. ?Talk to your health care provider about which screenings and  vaccines you need and how often you need them. ?This information is  not intended to replace advice given to you by your health care provider. Make sure you discuss any questions you have with your health care provider. ?Document Released: 11/12/2015 Document Revised: 07/05/2016 Document Reviewed: 08/17/2015 ?Elsevier Interactive Patient Education ? 2017 [place]. ? ?Fall Prevention in the Home ?Falls can cause injuries. They can happen to people of all ages. There are many things you can do to make your home safe and to help prevent falls. ?What can I do on the outside of my home? ?Regularly fix the edges of walkways and driveways and fix any cracks. ?Remove anything that might make you trip as you walk through a door, such as a raised step or threshold. ?Trim any bushes or trees on the path to your home. ?Use bright outdoor lighting. ?Clear any walking paths of anything that might make someone trip, such as rocks or tools. ?Regularly check to see if handrails are loose or broken. Make sure that both sides of any steps have handrails. ?Any raised decks and porches should have guardrails on the edges. ?Have any leaves, snow, or ice cleared regularly. ?Use sand or salt on walking paths during winter. ?Clean up any spills in your garage right away. This includes oil or grease spills. ?What can I do in the bathroom? ?Use night lights. ?Install grab bars by the toilet and in the tub and shower. Do not use towel bars as grab bars. ?Use non-skid mats or decals in the tub or shower. ?If you need to sit down in the shower, use a plastic, non-slip stool. ?Keep the floor dry. Clean up any water that spills on the floor as soon as it happens. ?Remove soap buildup in the tub or shower regularly. ?Attach bath mats securely with double-sided non-slip rug tape. ?Do not have throw rugs and other things on the floor that can make you trip. ?What can I do in the bedroom? ?Use night lights. ?Make sure that you have a light by your bed that is easy to reach. ?Do not use any sheets or blankets that  are too big for your bed. They should not hang down onto the floor. ?Have a firm chair that has side arms. You can use this for support while you get dressed. ?Do not have throw rugs and other things on the floor that can make you trip. ?What can I do in the kitchen? ?Clean up any spills right away. ?Avoid walking on wet floors. ?Keep items that you use a lot in easy-to-reach places. ?If you need to reach something above you, use a strong step stool that has a grab bar. ?Keep electrical cords out of the way. ?Do not use floor polish or wax that makes floors slippery. If you must use wax, use non-skid floor wax. ?Do not have throw rugs and other things on the floor that can make you trip. ?What can I do with my stairs? ?Do not leave any items on the stairs. ?Make sure that there are handrails on both sides of the stairs and use them. Fix handrails that are broken or loose. Make sure that handrails are as long as the stairways. ?Check any carpeting to make sure that it is firmly attached to the stairs. Fix any carpet that is loose or worn. ?Avoid having throw rugs at the top or bottom of the stairs. If you do have throw rugs, attach them to the floor with carpet tape. ?Make  sure that you have a light switch at the top of the stairs and the bottom of the stairs. If you do not have them, ask someone to add them for you. ?What else can I do to help prevent falls? ?Wear shoes that: ?Do not have high heels. ?Have rubber bottoms. ?Are comfortable and fit you well. ?Are closed at the toe. Do not wear sandals. ?If you use a stepladder: ?Make sure that it is fully opened. Do not climb a closed stepladder. ?Make sure that both sides of the stepladder are locked into place. ?Ask someone to hold it for you, if possible. ?Clearly mark and make sure that you can see: ?Any grab bars or handrails. ?First and last steps. ?Where the edge of each step is. ?Use tools that help you move around (mobility aids) if they are needed. These  include: ?Canes. ?Walkers. ?Scooters. ?Crutches. ?Turn on the lights when you go into a dark area. Replace any light bulbs as soon as they burn out. ?Set up your furniture so you have a clear path. Avoid movi

## 2022-02-01 NOTE — Progress Notes (Signed)
? ?Subjective:  ? Shelby Cardenas is a 81 y.o. female who presents for Medicare Annual (Subsequent) preventive examination. ? ?I connected with Adoria Kawamoto today by telephone and verified that I am speaking with the correct person using two identifiers. ?Location patient: home ?Location provider: work ?Persons participating in the virtual visit: patient, provider. ?  ?I discussed the limitations, risks, security and privacy concerns of performing an evaluation and management service by telephone and the availability of in person appointments. I also discussed with the patient that there may be a patient responsible charge related to this service. The patient expressed understanding and verbally consented to this telephonic visit.  ?  ?Interactive audio and video telecommunications were attempted between this provider and patient, however failed, due to patient having technical difficulties OR patient did not have access to video capability.  We continued and completed visit with audio only. ? ?  ?Review of Systems    ? ?Cardiac Risk Factors include: advanced age (>25mn, >>82women) ? ?   ?Objective:  ?  ?Today's Vitals  ? ?There is no height or weight on file to calculate BMI. ? ? ?  02/01/2022  ? 10:35 AM 01/25/2021  ? 10:59 AM  ?Advanced Directives  ?Does Patient Have a Medical Advance Directive? Yes Yes  ?Type of AParamedicof ATwinLiving will HH[place]Living will  ?Copy of [place] Chart? No - copy requested No - copy requested  ? ? ?Current Medications (verified) ?Outpatient Encounter Medications as of 02/01/2022  ?Medication Sig  ? Alpha Lipoic Acid 200 MG CAPS Take by mouth.  ? Ascorbic Acid (VITAMIN C) 100 MG tablet Take 100 mg by mouth daily.  ? b complex vitamins capsule Take 1 capsule by mouth daily.  ? Calcium-Phosphorus-Vitamin D (CITRACAL +D3 PO) Take by mouth.  ? latanoprost (XALATAN) 0.005 % ophthalmic solution Place 1 drop into  both eyes at bedtime.  ? Multiple Vitamin (MULTIVITAMIN ADULT PO) Take by mouth.  ? Turmeric Curcumin 500 MG CAPS Take 1 capsule by mouth daily.  ? psyllium (REGULOID) 0.52 g capsule Take 0.52 g by mouth daily.  ? ?No facility-administered encounter medications on file as of 02/01/2022.  ? ? ?Allergies (verified) ?Macrobid [nitrofurantoin], Boniva [ibandronic acid], Gabapentin, Prednisone, Tizanidine, Tramadol, Ciprofloxacin, Codeine, Plaquenil [hydroxychloroquine], and Sulfa antibiotics  ? ?History: ?Past Medical History:  ?Diagnosis Date  ? GERD (gastroesophageal reflux disease)   ? Lupus (HRachel   ? ?Past Surgical History:  ?Procedure Laterality Date  ? CESAREAN SECTION    ? CHOLECYSTECTOMY    ? KNEE SURGERY Left   ? NEUROPLASTY / TRANSPOSITION MEDIAN NERVE AT CARPAL TUNNEL  05/2021  ? Back  ? SKIN CANCER EXCISION    ? TONSILLECTOMY    ? ?Family History  ?Problem Relation Age of Onset  ? Arthritis Mother   ? Arthritis Father   ? Breast cancer Daughter   ? Breast cancer Paternal Aunt   ? ?Social History  ? ?Socioeconomic History  ? Marital status: Married  ?  Spouse name: Not on file  ? Number of children: Not on file  ? Years of education: Not on file  ? Highest education level: Not on file  ?Occupational History  ? Not on file  ?Tobacco Use  ? Smoking status: Never  ? Smokeless tobacco: Never  ?Vaping Use  ? Vaping Use: Never used  ?Substance and Sexual Activity  ? Alcohol use: Not Currently  ? Drug use: Never  ?  Sexual activity: Yes  ?Other Topics Concern  ? Not on file  ?Social History Narrative  ? Not on file  ? ?Social Determinants of Health  ? ?Financial Resource Strain: Low Risk   ? Difficulty of Paying Living Expenses: Not hard at all  ?Food Insecurity: No Food Insecurity  ? Worried About Charity fundraiser in the Last Year: Never true  ? Ran Out of Food in the Last Year: Never true  ?Transportation Needs: No Transportation Needs  ? Lack of Transportation (Medical): No  ? Lack of Transportation  (Non-Medical): No  ?Physical Activity: Sufficiently Active  ? Days of Exercise per Week: 5 days  ? Minutes of Exercise per Session: 30 min  ?Stress: No Stress Concern Present  ? Feeling of Stress : Not at all  ?Social Connections: Socially Integrated  ? Frequency of Communication with Friends and Family: Twice a week  ? Frequency of Social Gatherings with Friends and Family: Twice a week  ? Attends Religious Services: More than 4 times per year  ? Active Member of Clubs or Organizations: Yes  ? Attends Archivist Meetings: 1 to 4 times per year  ? Marital Status: Married  ? ? ?Tobacco Counseling ?Counseling given: Not Answered ? ? ?Clinical Intake: ? ?Pre-visit preparation completed: Yes ? ?Pain : No/denies pain ? ?  ? ?Nutritional Risks: None ?Diabetes: No ? ?How often do you need to have someone help you when you read instructions, pamphlets, or other written materials from your doctor or pharmacy?: 1 - Never ?What is the last grade level you completed in school?: masters ? ?Diabetic?no  ? ?Interpreter Needed?: No ? ?Information entered by :: [email or web address] ? ? ?Activities of Daily Living ? ?  02/01/2022  ? 10:38 AM  ?In your present state of health, do you have any difficulty performing the following activities:  ?Hearing? 0  ?Vision? 0  ?Difficulty concentrating or making decisions? 0  ?Walking or climbing stairs? 0  ?Dressing or bathing? 0  ?Doing errands, shopping? 0  ?Preparing Food and eating ? N  ?Using the Toilet? N  ?In the past six months, have you accidently leaked urine? N  ?Do you have problems with loss of bowel control? N  ?Managing your Medications? N  ?Managing your Finances? N  ?Housekeeping or managing your Housekeeping? N  ? ? ?Patient Care Team: ?Haydee Salter, MD as PCP - General (Family Medicine) ?Zonia Kief, MD (Rehabilitation) ?Murlean Iba, MD as Referring Physician (Orthopedic Surgery) ?Shamleffer, Melanie Crazier, MD as Attending Physician  (Endocrinology) ? ?Indicate any recent Medical Services you may have received from other than [facility] providers in the past year (date may be approximate). ? ?   ?Assessment:  ? This is a routine wellness examination for Shelby Cardenas. ? ?Hearing/Vision screen ?Vision Screening - Comments:: Annual eye exams wear glasses  ? ?Dietary issues and exercise activities discussed: ?Current Exercise Habits: Home exercise routine, Type of exercise: walking, Time (Minutes): 30, Frequency (Times/Week): 5, Weekly Exercise (Minutes/Week): 150, Intensity: Mild, Exercise limited by: None identified ? ? Goals Addressed   ? ?  ?  ?  ?  ? This Visit's Progress  ?  Patient Stated   On track  ?  Get rid of extra abdominal fat & continue healthy eating ?  ? ?  ? ?Depression Screen ? ?  02/01/2022  ? 10:36 AM 02/01/2022  ? 10:33 AM 01/25/2021  ? 11:00 AM 11/12/2020  ?  1:12 PM  ?PHQ 2/9 Scores  ?  PHQ - 2 Score 0 0 0 0  ?  ?Fall Risk ? ?  02/01/2022  ? 10:36 AM 12/02/2021  ?  8:57 AM 04/06/2021  ?  1:24 PM 01/25/2021  ? 11:00 AM  ?Fall Risk   ?Falls in the past year? 0 1 0 0  ?Number falls in past yr: 0 0 0 0  ?Injury with Fall? 0 1 0 0  ?Risk for fall due to :  History of fall(s)    ?Follow up Falls evaluation completed Falls evaluation completed  Falls prevention discussed  ? ? ?FALL RISK PREVENTION PERTAINING TO THE HOME: ? ?Any stairs in or around the home? Yes  ?If so, are there any without handrails? No  ?Home free of loose throw rugs in walkways, pet beds, electrical cords, etc? Yes  ?Adequate lighting in your home to reduce risk of falls? Yes  ? ?ASSISTIVE DEVICES UTILIZED TO PREVENT FALLS: ? ?Life alert? No  ?Use of a cane, walker or w/c? No  ?Grab bars in the bathroom? Yes  ?Shower chair or bench in shower? Yes  ?Elevated toilet seat or a handicapped toilet? Yes  ? ?Cognitive Function: ?  ? Normal cognitive status assessed by telephone conversation  by this Nurse Health Advisor. No abnormalities found.  ? ?  ? ?Immunizations ?Immunization History   ?Administered Date(s) Administered  ? Fluad Quad(high Dose 65+) 09/13/2021  ? Influenza, High Dose Seasonal PF 09/05/2013, 08/17/2016, 08/13/2017  ? PFIZER(Purple Top)SARS-COV-2 Vaccination 11/15/2019, 12/06/2019  ? Pneumococcal Conjugate-13

## 2022-02-02 DIAGNOSIS — M13849 Other specified arthritis, unspecified hand: Secondary | ICD-10-CM | POA: Diagnosis not present

## 2022-02-02 DIAGNOSIS — M13841 Other specified arthritis, right hand: Secondary | ICD-10-CM | POA: Diagnosis not present

## 2022-02-02 DIAGNOSIS — M6740 Ganglion, unspecified site: Secondary | ICD-10-CM | POA: Diagnosis not present

## 2022-02-02 DIAGNOSIS — M79641 Pain in right hand: Secondary | ICD-10-CM | POA: Diagnosis not present

## 2022-02-15 DIAGNOSIS — D225 Melanocytic nevi of trunk: Secondary | ICD-10-CM | POA: Diagnosis not present

## 2022-02-15 DIAGNOSIS — Z808 Family history of malignant neoplasm of other organs or systems: Secondary | ICD-10-CM | POA: Diagnosis not present

## 2022-02-15 DIAGNOSIS — L821 Other seborrheic keratosis: Secondary | ICD-10-CM | POA: Diagnosis not present

## 2022-02-15 DIAGNOSIS — D2271 Melanocytic nevi of right lower limb, including hip: Secondary | ICD-10-CM | POA: Diagnosis not present

## 2022-02-15 DIAGNOSIS — L570 Actinic keratosis: Secondary | ICD-10-CM | POA: Diagnosis not present

## 2022-02-15 DIAGNOSIS — L820 Inflamed seborrheic keratosis: Secondary | ICD-10-CM | POA: Diagnosis not present

## 2022-02-15 DIAGNOSIS — L578 Other skin changes due to chronic exposure to nonionizing radiation: Secondary | ICD-10-CM | POA: Diagnosis not present

## 2022-02-15 DIAGNOSIS — Z85828 Personal history of other malignant neoplasm of skin: Secondary | ICD-10-CM | POA: Diagnosis not present

## 2022-03-24 ENCOUNTER — Ambulatory Visit: Payer: Medicare PPO | Admitting: Family Medicine

## 2022-03-24 ENCOUNTER — Ambulatory Visit (INDEPENDENT_AMBULATORY_CARE_PROVIDER_SITE_OTHER): Payer: Medicare PPO

## 2022-03-24 VITALS — BP 136/78 | HR 81 | Temp 97.1°F | Ht 64.0 in | Wt 165.6 lb

## 2022-03-24 DIAGNOSIS — R5383 Other fatigue: Secondary | ICD-10-CM | POA: Diagnosis not present

## 2022-03-24 DIAGNOSIS — M8589 Other specified disorders of bone density and structure, multiple sites: Secondary | ICD-10-CM

## 2022-03-24 DIAGNOSIS — M79604 Pain in right leg: Secondary | ICD-10-CM

## 2022-03-24 NOTE — Progress Notes (Signed)
[facility] [address] [place] [address] Dept: [phone] Dept Fax: [phone]  Office Visit  Subjective:    Patient ID: Shelby Cardenas, female    DOB: [birth year], 81 y.o..   MRN: [id number]  Chief Complaint  Patient presents with   Follow-up    2 month f/u.  Still having fatigue/tiredness/no energy. Also having a pain Rt hip/leg x 1 day.  Has taken Tylenol and Ibuprofen.      History of Present Illness:  Patient was to be in today for follow-up of more routine issues, including her ongoing fatigue. However, she notes that she has been having pain in the lateral right thigh for the past 2 days. She denies any specific injury. She finds that walking makes this hurt worse. It bothered her enough yesterday that she did not go for her usual walk. She is using heat, ibuprofen, and Tylenol for this. Ms. 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 She did see an endocrinologist and discuss possibly starting Prolia. However, MS. Ferrando felt like the possible side effects might confuse the picture with other chronic fatigue and joint achiness she is experiencing currently.  MS. Glanz continues to complain of fatigue, which had started back in Feb. She finds she tires easily during the day. She admits to occasional feelings of dyspnea with exertion, but not at rest. She also notes some generalized muscle achiness as part of the overall picture. Although she has stressors in her life, she is not showing signs of depression.  Past Medical History: Patient Active Problem List   Diagnosis Date Noted   Positive ANA (antinuclear antibody) 10/17/2021   Incontinence of feces 10/12/2021   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   Osteoarthritis of hands, bilateral 10/12/2021   Intervertebral disc disorder with radiculopathy of lumbar region 09/06/2021   Closed wedge compression fracture of  first lumbar vertebra ([place]) 06/09/2021   OAB (overactive bladder) 11/12/2020   Primary open angle glaucoma of both eyes, moderate stage 12/12/2018   Mammogram abnormal 07/16/2018   Cystocele with prolapse 07/02/2018   Adenomatous colon polyp 04/11/2016   Mixed hyperlipidemia 02/06/2016   Osteopenia 02/06/2016   Diaphragmatic hernia 02/06/2016   Allergic rhinitis 02/06/2016   Past Surgical History:  Procedure Laterality Date   CESAREAN SECTION     CHOLECYSTECTOMY     KNEE SURGERY Left    NEUROPLASTY / TRANSPOSITION MEDIAN NERVE AT CARPAL TUNNEL  05/2021   Back   SKIN CANCER EXCISION     TONSILLECTOMY     Family History  Problem Relation Age of Onset   Arthritis Mother    Arthritis Father    Breast cancer Daughter    Breast cancer Paternal Aunt    Outpatient Medications Prior to Visit  Medication Sig Dispense Refill   Alpha Lipoic Acid 200 MG CAPS Take by mouth.     Ascorbic Acid (VITAMIN C) 100 MG tablet Take 100 mg by mouth daily.     b complex vitamins capsule Take 1 capsule by mouth daily.     Calcium-Phosphorus-Vitamin D (CITRACAL +D3 PO) Take by mouth.     latanoprost (XALATAN) 0.005 % ophthalmic solution Place 1 drop into both eyes at bedtime.     Multiple Vitamin (MULTIVITAMIN ADULT PO) Take by mouth.     Turmeric Curcumin 500 MG CAPS Take 1 capsule by mouth daily.     psyllium (REGULOID) 0.52 g capsule Take 0.52 g by mouth  daily.     No facility-administered medications prior to visit.   Allergies  Allergen Reactions   Macrobid [Nitrofurantoin] Rash   Boniva [Ibandronic Acid] Other (See Comments)    Muscle aches /headache   Gabapentin Other (See Comments)    Other reaction(s): Other (See Comments) Dizzy and fell when taking     Prednisone     Other reaction(s): Other (See Comments), weird feeling Makes me feel weird    Tizanidine     Low bp   Tramadol Itching    Other reaction(s): unsure   Ciprofloxacin Other (See Comments)    Other reaction(s): weird  feeling UNKNOWN UNKNOWN    Codeine Itching and Rash    Other reaction(s): itching, rash   Plaquenil [Hydroxychloroquine] Rash   Sulfa Antibiotics Itching and Rash     Objective:   Today's Vitals   03/24/22 1352  BP: 136/78  Pulse: 81  Temp: (!) 97.1 F (36.2 C)  TempSrc: Temporal  SpO2: 96%  Weight: 165 lb 9.6 oz (75.1 kg)  Height: '5\' 4"'$  (1.626 m)   Body mass index is 28.43 kg/m.   General: Well developed, well nourished. No acute distress. Extremities: Pain over the mid lateral right thigh. No swelling. Mildly tender with palpation. No increased pain   with active abduction, adduction, hip flexion/extension. No increased pain with FABER. Psych: Alert and oriented. Normal mood and affect.  Health Maintenance Due  Topic Date Due   Zoster Vaccines- Shingrix (2 of 2) 02/01/2022   Imaging: Right femur x-ray- No fracture, dislocation, or sing of hip pathology.    Assessment & Plan:   1. Right leg pain The pain appears muscular. I recommend she continue relative rest, heat tot he lateral thigh, and use of Tylenol/ibuprofen as needed. In the overall picture of her other joint complaints and fatigue, I will check a CK to assess for any specific muscular disease or polymyositis.  - DG FEMUR, MIN 2 VIEWS RIGHT - CK (Creatine Kinase)  2. Fatigue, unspecified type As above.  - CK (Creatine Kinase)  3. Osteopenia of multiple sites Has decided not to pursue Prolia at this time.   Return in about 3 months (around 06/24/2022) for Reassessment.   Haydee Salter, MD

## 2022-03-25 LAB — CK: Total CK: 193 U/L — ABNORMAL HIGH (ref 32–182)

## 2022-04-18 ENCOUNTER — Ambulatory Visit: Payer: Medicare PPO | Admitting: Family Medicine

## 2022-04-18 VITALS — BP 136/74 | HR 78 | Temp 97.6°F | Ht 64.0 in | Wt 164.8 lb

## 2022-04-18 DIAGNOSIS — R5383 Other fatigue: Secondary | ICD-10-CM

## 2022-04-18 DIAGNOSIS — R0600 Dyspnea, unspecified: Secondary | ICD-10-CM

## 2022-04-18 NOTE — Progress Notes (Signed)
[facility] [address] [place] [address] Dept: [phone] Dept Fax: [phone]  Office Visit  Subjective:    Patient ID: Shelby Cardenas, female    DOB: [birth year], 81 y.o..   MRN: [id number]  Chief Complaint  Patient presents with   Acute Visit    C/o having still having fatigue/joint pain, no energy and SOB with/wo activities.       History of Present Illness:  Patient is in today for with ongoing complaints of fatigue, which had started back in Feb. She finds she tires easily during the day. She admits to occasional feelings of dyspnea with exertion, but not at rest. She notes this has been gradually progressive. She finds that when she gets up in the morning, within an hour she feels like she could go back to bed, though she resists this. She also notes some generalized muscle achiness as part of the overall picture. Although she has stressors in her life, she is not showing signs of depression. She had some lab testing by Dr. Kelton Pillar in March that showed some minor thyroid abnormalities.  Past Medical History: Patient Active Problem List   Diagnosis Date Noted   Positive ANA (antinuclear antibody) 10/17/2021   Incontinence of feces 10/12/2021   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   Osteoarthritis of hands, bilateral 10/12/2021   Intervertebral disc disorder with radiculopathy of lumbar region 09/06/2021   Closed wedge compression fracture of first lumbar vertebra ([place]) 06/09/2021   OAB (overactive bladder) 11/12/2020   Primary open angle glaucoma of both eyes, moderate stage 12/12/2018   Mammogram abnormal 07/16/2018   Cystocele with prolapse 07/02/2018   Adenomatous colon polyp 04/11/2016   Mixed hyperlipidemia 02/06/2016   Osteopenia 02/06/2016   Diaphragmatic hernia 02/06/2016   Allergic rhinitis 02/06/2016   Past Surgical History:  Procedure Laterality Date   CESAREAN SECTION      CHOLECYSTECTOMY     KNEE SURGERY Left    NEUROPLASTY / TRANSPOSITION MEDIAN NERVE AT CARPAL TUNNEL  05/2021   Back   SKIN CANCER EXCISION     TONSILLECTOMY     Family History  Problem Relation Age of Onset   Arthritis Mother    Arthritis Father    Breast cancer Daughter    Breast cancer Paternal Aunt    Outpatient Medications Prior to Visit  Medication Sig Dispense Refill   Alpha Lipoic Acid 200 MG CAPS Take by mouth.     Ascorbic Acid (VITAMIN C) 100 MG tablet Take 100 mg by mouth daily.     b complex vitamins capsule Take 1 capsule by mouth daily.     Calcium-Phosphorus-Vitamin D (CITRACAL +D3 PO) Take by mouth.     latanoprost (XALATAN) 0.005 % ophthalmic solution Place 1 drop into both eyes at bedtime.     Multiple Vitamin (MULTIVITAMIN ADULT PO) Take by mouth.     Turmeric Curcumin 500 MG CAPS Take 1 capsule by mouth daily.     No facility-administered medications prior to visit.   Allergies  Allergen Reactions   Macrobid [Nitrofurantoin] Rash   Boniva [Ibandronic Acid] Other (See Comments)    Muscle aches /headache   Gabapentin Other (See Comments)    Other reaction(s): Other (See Comments) Dizzy and fell when taking     Prednisone     Other reaction(s): Other (See Comments), weird feeling Makes me feel weird    Tizanidine     Low bp   Tramadol Itching  Other reaction(s): unsure   Ciprofloxacin Other (See Comments)    Other reaction(s): weird feeling UNKNOWN UNKNOWN    Codeine Itching and Rash    Other reaction(s): itching, rash   Plaquenil [Hydroxychloroquine] Rash   Sulfa Antibiotics Itching and Rash    Objective:   Today's Vitals   04/18/22 1524  BP: 136/74  Pulse: 78  Temp: 97.6 F (36.4 C)  TempSrc: Temporal  SpO2: 97%  Weight: 164 lb 12.8 oz (74.8 kg)  Height: '5\' 4"'$  (1.626 m)   Body mass index is 28.29 kg/m.   General: Well developed, well nourished. No acute distress. CV: RRR without murmurs or rubs. Pulses 2+ bilaterally. Psych:  Alert and oriented. Normal mood and affect.  There are no preventive care reminders to display for this patient.  Lab Results Component Ref Range & Units 3 wk ago 1 yr ago  Total CK 32 - 182 U/L 193 High   132 R       Latest Ref Rng & Units 01/02/2022   10:26 AM 12/02/2021   10:04 AM 06/02/2021    5:02 PM  BMP  Glucose 70 - 99 mg/dL 83  90  128   BUN 6 - 23 mg/dL 16   14   Creatinine 0.40 - 1.20 mg/dL 0.73   0.68   Sodium 135 - 145 mEq/L 136   132   Potassium 3.5 - 5.1 mEq/L 4.4   4.0   Chloride 96 - 112 mEq/L 100   98   CO2 19 - 32 mEq/L 26   26   Calcium 8.4 - 10.5 mg/dL 9.2   9.1    Lab Results  Component Value Date   TSH 3.18 01/02/2022   Component Ref Range & Units 3 mo ago  Free T4 0.60 - 1.60 ng/dL 0.59 Low       Component Ref Range & Units 6 mo ago  ANA Titer 1 titer 1:40 High    Comment: A low level ANA titer may be present in pre-clinical  autoimmune diseases and normal individuals.                  Reference Range                  <1:40        Negative                  1:40-1:80    Low Antibody Level                  >1:80        Elevated Antibody Level  .   ANA Pattern 1  Nuclear, Speckled Abnormal     Component Ref Range & Units 6 mo ago  Sed Rate 0 - 30 mm/hr 16    Component Ref Range & Units 6 mo ago  CRP 0.5 - 20.0 mg/dL <1.0       Latest Ref Rng & Units 10/12/2021   10:36 AM 06/02/2021    5:02 PM 04/06/2021    2:05 PM  CBC  WBC 4.0 - 10.5 K/uL 4.9  7.7  6.4   Hemoglobin 12.0 - 15.0 g/dL 13.3  12.5  13.1   Hematocrit 36.0 - 46.0 % 39.5  38.0  39.8   Platelets 150.0 - 400.0 K/uL 224.0  252  218.0     Assessment & Plan:   1. Fatigue, unspecified type Shelby Cardenas continues to complain of significant fatigue. So far,  her workup has been non revealing. As she had a mildly low free T4 in March, I think it would be appropriate to reassess her thyrodi function at this point. I will also check a CBC to rule out anemia.  - TSH - T4, free - CBC -  Ambulatory referral to Cardiology  2. Dyspnea, unspecified type In light of her finding of dyspnea, I iwll refer her to cardiology to assess for any underlying cardiac issues that could be contributing. If this checks out okay, I would then consider a rheumatology referral.  - TSH - T4, free - CBC - Ambulatory referral to Cardiology  Return in about 4 weeks (around 05/16/2022) for Reassessment.   Haydee Salter, MD

## 2022-04-19 LAB — CBC
HCT: 36.2 % (ref 36.0–46.0)
Hemoglobin: 12.3 g/dL (ref 12.0–15.0)
MCHC: 34 g/dL (ref 30.0–36.0)
MCV: 91.3 fl (ref 78.0–100.0)
Platelets: 233 10*3/uL (ref 150.0–400.0)
RBC: 3.97 Mil/uL (ref 3.87–5.11)
RDW: 12.7 % (ref 11.5–15.5)
WBC: 5.9 10*3/uL (ref 4.0–10.5)

## 2022-04-19 LAB — TSH: TSH: 3.44 u[IU]/mL (ref 0.35–5.50)

## 2022-04-19 LAB — T4, FREE: Free T4: 0.74 ng/dL (ref 0.60–1.60)

## 2022-05-03 DIAGNOSIS — M47816 Spondylosis without myelopathy or radiculopathy, lumbar region: Secondary | ICD-10-CM | POA: Diagnosis not present

## 2022-05-03 DIAGNOSIS — M5116 Intervertebral disc disorders with radiculopathy, lumbar region: Secondary | ICD-10-CM | POA: Diagnosis not present

## 2022-05-03 DIAGNOSIS — M549 Dorsalgia, unspecified: Secondary | ICD-10-CM | POA: Diagnosis not present

## 2022-05-10 DIAGNOSIS — M545 Low back pain, unspecified: Secondary | ICD-10-CM | POA: Diagnosis not present

## 2022-05-19 DIAGNOSIS — M545 Low back pain, unspecified: Secondary | ICD-10-CM | POA: Diagnosis not present

## 2022-05-22 ENCOUNTER — Ambulatory Visit: Payer: Medicare PPO | Admitting: Family Medicine

## 2022-05-22 VITALS — BP 134/72 | HR 72 | Temp 97.3°F | Ht 64.0 in | Wt 165.8 lb

## 2022-05-22 DIAGNOSIS — R5383 Other fatigue: Secondary | ICD-10-CM

## 2022-05-22 DIAGNOSIS — M2391 Unspecified internal derangement of right knee: Secondary | ICD-10-CM

## 2022-05-22 NOTE — Progress Notes (Signed)
[facility] [address] [place] [address] Dept: [phone] Dept Fax: [phone]  Chronic Care Office Visit  Subjective:    Patient ID: Shelby Cardenas, female    DOB: 18-Jun-1941, 81 y.o..   MRN: [id number]  Chief Complaint  Patient presents with   Follow-up    4 week f/u. 05/15/22 experienced a jam on R knee on stairs. Experiencing pain on & off since the 17th.     History of Present Illness:  Patient is in today for reassessment of fatigue, which had started back in Feb. She had been finding she tires easily during the day. She was also experiencing some dyspnea with exertion, but not at rest. Since her last visit, she feels the fatigue may be a mild bit better. She is still pending cardiology assessment, esp. related to the DOE.  Ms. Violette notes a past history of left knee meniscal injury. She had arthroscopy at one point, but then the issues recurred. She was advised against further surgery for that knee and it did gradually improve. About a week ago, while walking down her basement stairs, she had acute onset of right knee pain. She has not had popping or clicking in the joint. She does note pain both with ambulation and at rest. She has been taking 800 mg of ibuprofen at bedtime. She is currently undergoing physical therapy related to her back. Last week, the physical therapist did work a small amount with her knee, but told ehr if it was not improved this week to see her doctor.  Past Medical History: Patient Active Problem List   Diagnosis Date Noted   Positive ANA (antinuclear antibody) 10/17/2021   Incontinence of feces 10/12/2021   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   Osteoarthritis of hands, bilateral 10/12/2021   Intervertebral disc disorder with radiculopathy of lumbar region 09/06/2021   Closed wedge compression fracture of first lumbar vertebra ([place]) 06/09/2021   OAB (overactive bladder)  11/12/2020   Primary open angle glaucoma of both eyes, moderate stage 12/12/2018   Mammogram abnormal 07/16/2018   Cystocele with prolapse 07/02/2018   Adenomatous colon polyp 04/11/2016   Mixed hyperlipidemia 02/06/2016   Osteopenia 02/06/2016   Diaphragmatic hernia 02/06/2016   Allergic rhinitis 02/06/2016   Past Surgical History:  Procedure Laterality Date   CESAREAN SECTION     CHOLECYSTECTOMY     KNEE SURGERY Left    NEUROPLASTY / TRANSPOSITION MEDIAN NERVE AT CARPAL TUNNEL  05/2021   Back   SKIN CANCER EXCISION     TONSILLECTOMY     Family History  Problem Relation Age of Onset   Arthritis Mother    Arthritis Father    Breast cancer Daughter    Breast cancer Paternal Aunt    Outpatient Medications Prior to Visit  Medication Sig Dispense Refill   Alpha Lipoic Acid 200 MG CAPS Take by mouth.     Ascorbic Acid (VITAMIN C) 100 MG tablet Take 100 mg by mouth daily.     b complex vitamins capsule Take 1 capsule by mouth daily.     Calcium-Phosphorus-Vitamin D (CITRACAL +D3 PO) Take by mouth.     diclofenac (VOLTAREN) 75 MG EC tablet Take 75 mg by mouth 2 (two) times daily. Take one tablet by mouth. Twice a day with food as needed for pain/inflammation.     latanoprost (XALATAN) 0.005 % ophthalmic solution Place 1 drop into both eyes at bedtime.     Multiple Vitamin (MULTIVITAMIN ADULT  PO) Take by mouth.     Turmeric Curcumin 500 MG CAPS Take 1 capsule by mouth daily.     No facility-administered medications prior to visit.   Allergies  Allergen Reactions   Macrobid [Nitrofurantoin] Rash   Boniva [Ibandronic Acid] Other (See Comments)    Muscle aches /headache   Gabapentin Other (See Comments)    Other reaction(s): Other (See Comments) Dizzy and fell when taking     Prednisone     Other reaction(s): Other (See Comments), weird feeling Makes me feel weird    Tizanidine     Low bp   Tramadol Itching    Other reaction(s): unsure   Ciprofloxacin Other (See  Comments)    Other reaction(s): weird feeling UNKNOWN UNKNOWN    Codeine Itching and Rash    Other reaction(s): itching, rash   Plaquenil [Hydroxychloroquine] Rash   Sulfa Antibiotics Itching and Rash     Objective:   Today's Vitals   05/22/22 1531  BP: 134/72  Pulse: 72  Temp: (!) 97.3 F (36.3 C)  TempSrc: Temporal  SpO2: 97%  Weight: 165 lb 12.8 oz (75.2 kg)  Height: '5\' 4"'$  (1.626 m)   Body mass index is 28.46 kg/m.   General: Well developed, well nourished. No acute distress. Extremities: Good ROM of right knee, but painful. Mild enlargement of the joint and there appears to be a   small effusion present. No increased warmth. Varus/valgus and Lachman's testing normal. There eis clicking   with McMurray's and pain medially.  Psych: Alert and oriented. Normal mood and affect.  Health Maintenance Due  Topic Date Due   COVID-19 Vaccine (3 - Pfizer risk series) 01/03/2020     Lab Results:    Latest Ref Rng & Units 04/18/2022    3:54 PM 10/12/2021   10:36 AM 06/02/2021    5:02 PM  CBC  WBC 4.0 - 10.5 K/uL 5.9  4.9  7.7   Hemoglobin 12.0 - 15.0 g/dL 12.3  13.3  12.5   Hematocrit 36.0 - 46.0 % 36.2  39.5  38.0   Platelets 150.0 - 400.0 K/uL 233.0  224.0  252    Component Ref Range & Units 1 mo ago 4 mo ago  TSH 0.35 - 5.50 uIU/mL 3.44  3.18    Component Ref Range & Units 1 mo ago 4 mo ago  Free T4 0.60 - 1.60 ng/dL 0.74  0.59 Low  CM    Assessment & Plan:   1. Fatigue, unspecified type Mildly improved. Recent lab results were normal. Recommend we continue to monitor this, however, I still want her to see cardiology related to her DOE.  2. Acute internal derangement of right knee I suspect she has had an acute exacerbation of knee pain related to some chronic medial meniscal degeneration. I will request PT to continue to work with her. She should continue to use some Voltaren gel during the day and take ibuprofen 800 mg at bedtime. A hinged knee brace could be  helpful for support and to decrease pain.  - Ambulatory referral to Physical Therapy   Return in about 3 months (around 08/22/2022) for Reassessment.   Haydee Salter, MD

## 2022-05-23 DIAGNOSIS — M545 Low back pain, unspecified: Secondary | ICD-10-CM | POA: Diagnosis not present

## 2022-05-25 DIAGNOSIS — M545 Low back pain, unspecified: Secondary | ICD-10-CM | POA: Diagnosis not present

## 2022-05-29 DIAGNOSIS — H401132 Primary open-angle glaucoma, bilateral, moderate stage: Secondary | ICD-10-CM | POA: Diagnosis not present

## 2022-05-29 DIAGNOSIS — H04123 Dry eye syndrome of bilateral lacrimal glands: Secondary | ICD-10-CM | POA: Diagnosis not present

## 2022-05-30 DIAGNOSIS — M545 Low back pain, unspecified: Secondary | ICD-10-CM | POA: Diagnosis not present

## 2022-06-01 DIAGNOSIS — M545 Low back pain, unspecified: Secondary | ICD-10-CM | POA: Diagnosis not present

## 2022-06-02 ENCOUNTER — Ambulatory Visit: Payer: Medicare PPO | Admitting: Family Medicine

## 2022-06-05 DIAGNOSIS — M25561 Pain in right knee: Secondary | ICD-10-CM | POA: Diagnosis not present

## 2022-06-06 ENCOUNTER — Other Ambulatory Visit: Payer: Self-pay

## 2022-06-06 DIAGNOSIS — K219 Gastro-esophageal reflux disease without esophagitis: Secondary | ICD-10-CM | POA: Insufficient documentation

## 2022-06-06 DIAGNOSIS — L932 Other local lupus erythematosus: Secondary | ICD-10-CM | POA: Insufficient documentation

## 2022-06-06 DIAGNOSIS — M329 Systemic lupus erythematosus, unspecified: Secondary | ICD-10-CM | POA: Insufficient documentation

## 2022-06-06 NOTE — Progress Notes (Unsigned)
Cardiology Office Note:    Date:  06/07/2022   ID:  Shelby Cardenas, DOB 02-16-1941, MRN [id number]  PCP:  Haydee Salter, MD  Cardiologist:  Shirlee More, MD   Referring MD: Haydee Salter, MD  ASSESSMENT:    1. Other fatigue   2. RBBB   3. Chest pain of uncertain etiology   4. Mixed hyperlipidemia    PLAN:    In order of problems listed above:  Her clinical problem is fatigue severe chronic ever since the life disruptive event of vertebral fracture last summer she does not have sleep apnea no endocrine disorder she is not anemic and takes no medications particularly associated with fatigue.  She does have an abnormal EKG with right bundle branch block typically not associated with heart muscle disease however it really is not a good test for looking at left right ventricular function and I am going to go ahead and schedule an echocardiogram to reassure both of [place] that are not missing cardiomyopathy.  She is comfortable with this approach if it is unremarkable at this time I would not do further cardiac testing.  Although this does make me wonder if she had a subclinical case of COVID-19 and has chronic fatigue.  Although I think could be low yield in this case perhaps an outpatient sleep study would be helpful excluding a sleep disorder and she no longer shares the bedroom with her husband. Check echocardiogram with right bundle branch block Chest pain is not severe it is nonanginal at this time I would not do an ischemia evaluation Currently not on lipid-lowering therapy  Next appointment   Medication Adjustments/Labs and Tests Ordered: Current medicines are reviewed at length with the patient today.  Concerns regarding medicines are outlined above.  No orders of the defined types were placed in this encounter.  No orders of the defined types were placed in this encounter.    Chief complaint is chronic fatigue  History of Present Illness:    Shelby Cardenas is a 81 y.o.  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  EKG performed 06/03/2021 independently reviewed by me showing sinus rhythm right bundle branch block first-degree AV block Echocardiogram performed [place] 08/19/2019 Lollie Marrow normal left ventricular ejection fraction 55 to 60% normal segmental function mild concentric LVH diastolic function was described as normal and no valvular abnormality.  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 more than a decade ago but has not required steroid therapy and does not have systemic disease She has no history of heart disease congenital rheumatic or atrial fibrillation She has a smart watch but has not activated his heart rhythm monitoring  She tells me she gets up in the morning and by the time she finishes breakfast she feels exhausted She is still able to do physical activity She does not have edema shortness of breath orthopnea palpitation or syncope She said she gets a little  bit of chest pain brief momentary nonanginal underneath the ribs on both sides at times is even a little bit sharp She had a stress test done decades ago that was normal Past Medical History:  Diagnosis Date   Adenomatous colon polyp 04/11/2016   Allergic rhinitis 02/06/2016   Closed wedge compression fracture of first lumbar vertebra ([place]) 06/09/2021   Cystocele with prolapse 07/02/2018   Diaphragmatic hernia 02/06/2016   GERD (gastroesophageal reflux  disease)    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 2014: Fracture fibula 2017: -1.8 2019: -1.4 2022: L1 compression fracture   Positive ANA (antinuclear antibody) 10/17/2021   09-2021: 1:40, speckled pattern   Primary open angle glaucoma of both eyes, moderate stage 12/12/2018    Past Surgical History:  Procedure Laterality Date   CESAREAN SECTION     CHOLECYSTECTOMY     KNEE SURGERY Left    NEUROPLASTY / TRANSPOSITION MEDIAN NERVE AT CARPAL TUNNEL  05/2021   Back   SKIN CANCER EXCISION     TONSILLECTOMY      Current Medications: Current Meds  Medication Sig   Alpha Lipoic Acid 200 MG CAPS Take 200 mg by mouth daily.   Ascorbic Acid (VITAMIN C) 100 MG tablet Take 100 mg by mouth daily.   b complex vitamins capsule Take 1 capsule by mouth daily.   Calcium-Phosphorus-Vitamin D (CITRACAL +D3 PO) Take 1 capsule by mouth daily.   diclofenac (VOLTAREN) 75 MG EC tablet Take 75 mg by mouth 2 (two) times daily. Take one tablet by mouth. Twice a day with food as needed for pain/inflammation.   latanoprost (XALATAN) 0.005 % ophthalmic solution Place 1 drop into both eyes at bedtime.   Multiple Vitamin (MULTIVITAMIN ADULT PO) Take 1 tablet by mouth daily.   Turmeric Curcumin 500 MG CAPS Take 1 capsule by mouth daily.     Allergies:   Macrobid [nitrofurantoin], Boniva [ibandronic acid], Gabapentin, Prednisone, Tizanidine, Tramadol, Ciprofloxacin, Codeine, Plaquenil [hydroxychloroquine], and Sulfa antibiotics   Social History    Socioeconomic History   Marital status: Married    Spouse name: Not on file   Number of children: Not on file   Years of education: Not on file   Highest education level: Not on file  Occupational History   Not on file  Tobacco Use   Smoking status: Never   Smokeless tobacco: Never  Vaping Use   Vaping Use: Never used  Substance and Sexual Activity   Alcohol use: Not Currently   Drug use: Never   Sexual activity: Yes  Other Topics Concern   Not on file  Social History Narrative   Not on file   Social Determinants of Health   Financial Resource Strain: Low Risk  (02/01/2022)   Overall Financial Resource Strain (CARDIA)    Difficulty of Paying Living Expenses: Not hard at all  Food Insecurity: No Food Insecurity (02/01/2022)   Hunger Vital Sign    Worried About Running Out of Food in the Last Year: Never true    [place] in the Last  Year: Never true  Transportation Needs: No Transportation Needs (02/01/2022)   PRAPARE - Hydrologist (Medical): No    Lack of Transportation (Non-Medical): No  Physical Activity: Sufficiently Active (02/01/2022)   Exercise Vital Sign    Days of Exercise per Week: 5 days    Minutes of Exercise per Session: 30 min  Stress: No Stress Concern Present (02/01/2022)   [place]    Feeling of Stress : Not at all  Social Connections: [place] (02/01/2022)   Social Connection and Isolation Panel [NHANES]    Frequency of Communication with Friends and Family: Twice a week    Frequency of Social Gatherings with Friends and Family: Twice a week    Attends Religious Services: More than 4 times per year    Active Member of [employer] or Organizations: Yes    Attends Archivist Meetings: 1 to 4 times per year    Marital Status: Married     Family History: The patient's family history includes Arthritis in her father and mother; Breast cancer in  her daughter and paternal aunt.  ROS:   ROS Please see the history of present illness.     All other systems reviewed and are negative.  EKGs/Labs/Other Studies Reviewed:    The following studies were reviewed today:   EKG:  EKG is  ordered today.  The ekg ordered today is personally reviewed and demonstrates sinus rhythm right bundle branch block  Recent Labs: 01/02/2022: BUN 16; Creatinine, Ser 0.73; Potassium 4.4; Sodium 136 04/18/2022: Hemoglobin 12.3; Platelets 233.0; TSH 3.44  Recent Lipid Panel No results found for: "CHOL", "TRIG", "HDL", "CHOLHDL", "VLDL", "LDLCALC", "LDLDIRECT"  Physical Exam:    VS:  BP (!) 146/64   Pulse 67   Ht '5\' 4"'$  (1.626 m)   Wt 163 lb 1.3 oz (74 kg)   SpO2 99%   BMI 27.99 kg/m     Wt Readings from Last 3 Encounters:  06/07/22 163 lb 1.3 oz (74 kg)  05/22/22 165 lb 12.8 oz (75.2 kg)  04/18/22 164 lb 12.8 oz (74.8 kg)     GEN: She appears her age does not look chronically ill or debilitated does not look fatigued to visual inspection well nourished, well developed in no acute distress HEENT: Normal NECK: No JVD; No carotid bruits LYMPHATICS: No lymphadenopathy CARDIAC: RRR, no murmurs, rubs, gallops RESPIRATORY:  Clear to auscultation without rales, wheezing or rhonchi  ABDOMEN: Soft, non-tender, non-distended MUSCULOSKELETAL:  No edema; No deformity she has mild  venous varicosity bilaterally SKIN: Warm and dry NEUROLOGIC:  Alert and oriented x 3 PSYCHIATRIC:  Normal affect     Signed, Shirlee More, MD  06/07/2022 9:35 AM    [place]

## 2022-06-07 ENCOUNTER — Encounter: Payer: Self-pay | Admitting: Cardiology

## 2022-06-07 ENCOUNTER — Ambulatory Visit: Payer: Medicare PPO | Admitting: Cardiology

## 2022-06-07 VITALS — BP 146/64 | HR 67 | Ht 64.0 in | Wt 163.1 lb

## 2022-06-07 DIAGNOSIS — R5383 Other fatigue: Secondary | ICD-10-CM | POA: Diagnosis not present

## 2022-06-07 DIAGNOSIS — I4510 Unspecified right bundle-branch block: Secondary | ICD-10-CM | POA: Diagnosis not present

## 2022-06-07 DIAGNOSIS — R079 Chest pain, unspecified: Secondary | ICD-10-CM | POA: Diagnosis not present

## 2022-06-07 DIAGNOSIS — E782 Mixed hyperlipidemia: Secondary | ICD-10-CM

## 2022-06-07 NOTE — Addendum Note (Signed)
Addended by: Edwyna Shell I on: 06/07/2022 09:58 AM   Modules accepted: Orders

## 2022-06-07 NOTE — Patient Instructions (Signed)
Medication Instructions:  Your physician recommends that you continue on your current medications as directed. Please refer to the Current Medication list given to you today.  *If you need a refill on your cardiac medications before your next appointment, please call your pharmacy*   Lab Work: None If you have labs (blood work) drawn today and your tests are completely normal, you will receive your results only by: [place] (if you have MyChart) OR A paper copy in the mail If you have any lab test that is abnormal or we need to change your treatment, we will call you to review the results.   Testing/Procedures: Your physician has requested that you have an echocardiogram. Echocardiography is a painless test that uses sound waves to create images of your heart. It provides your doctor with information about the size and shape of your heart and how well your heart's chambers and valves are working. This procedure takes approximately one hour. There are no restrictions for this procedure.    Follow-Up: At [facility], you and your health needs are our priority.  As part of our continuing mission to provide you with exceptional heart care, we have created designated Provider Care Teams.  These Care Teams include your primary Cardiologist (physician) and Advanced Practice Providers (APPs -  Physician Assistants and Nurse Practitioners) who all work together to provide you with the care you need, when you need it.  We recommend signing up for the patient portal called "MyChart".  Sign up information is provided on this After Visit Summary.  MyChart is used to connect with patients for Virtual Visits (Telemedicine).  Patients are able to view lab/test results, encounter notes, upcoming appointments, etc.  Non-urgent messages can be sent to your provider as well.   To learn more about what you can do with MyChart, go to [email or web address].    Your next appointment:   Follow up as  needed  The format for your next appointment:   In Person  Provider:   Shirlee More, MD{   Other Instructions None  Important Information About [place]

## 2022-06-08 DIAGNOSIS — M545 Low back pain, unspecified: Secondary | ICD-10-CM | POA: Diagnosis not present

## 2022-06-13 DIAGNOSIS — M545 Low back pain, unspecified: Secondary | ICD-10-CM | POA: Diagnosis not present

## 2022-06-14 DIAGNOSIS — Z6827 Body mass index (BMI) 27.0-27.9, adult: Secondary | ICD-10-CM | POA: Diagnosis not present

## 2022-06-14 DIAGNOSIS — M47816 Spondylosis without myelopathy or radiculopathy, lumbar region: Secondary | ICD-10-CM | POA: Diagnosis not present

## 2022-06-15 ENCOUNTER — Ambulatory Visit (HOSPITAL_BASED_OUTPATIENT_CLINIC_OR_DEPARTMENT_OTHER)
Admission: RE | Admit: 2022-06-15 | Discharge: 2022-06-15 | Disposition: A | Discharge status: Home / Self Care | Payer: Medicare PPO | Source: Ambulatory Visit | Attending: Cardiology | Admitting: Cardiology

## 2022-06-15 DIAGNOSIS — R0609 Other forms of dyspnea: Secondary | ICD-10-CM | POA: Diagnosis not present

## 2022-06-15 DIAGNOSIS — M545 Low back pain, unspecified: Secondary | ICD-10-CM | POA: Diagnosis not present

## 2022-06-15 DIAGNOSIS — E782 Mixed hyperlipidemia: Secondary | ICD-10-CM | POA: Diagnosis not present

## 2022-06-15 DIAGNOSIS — I4510 Unspecified right bundle-branch block: Secondary | ICD-10-CM | POA: Insufficient documentation

## 2022-06-15 DIAGNOSIS — R5383 Other fatigue: Secondary | ICD-10-CM | POA: Insufficient documentation

## 2022-06-15 DIAGNOSIS — R079 Chest pain, unspecified: Secondary | ICD-10-CM | POA: Diagnosis not present

## 2022-06-15 NOTE — Progress Notes (Signed)
  Echocardiogram 2D Echocardiogram has been performed.  Shelby Cardenas F 06/15/2022, 3:59 PM

## 2022-06-16 LAB — ECHOCARDIOGRAM COMPLETE
AR max vel: 2.31 cm2
AV Area VTI: 2.56 cm2
AV Area mean vel: 2.5 cm2
AV Mean grad: 4 mmHg
AV Peak grad: 7.7 mmHg
Ao pk vel: 1.39 m/s
Area-P 1/2: 2.92 cm2
P 1/2 time: 471 msec
S' Lateral: 2.6 cm

## 2022-06-20 DIAGNOSIS — M545 Low back pain, unspecified: Secondary | ICD-10-CM | POA: Diagnosis not present

## 2022-06-22 DIAGNOSIS — M545 Low back pain, unspecified: Secondary | ICD-10-CM | POA: Diagnosis not present

## 2022-06-23 ENCOUNTER — Ambulatory Visit: Payer: Medicare PPO | Admitting: Family Medicine

## 2022-06-23 ENCOUNTER — Encounter: Payer: Self-pay | Admitting: Family Medicine

## 2022-06-23 VITALS — BP 124/68 | HR 82 | Temp 96.7°F | Ht 64.0 in | Wt 164.0 lb

## 2022-06-23 DIAGNOSIS — M25561 Pain in right knee: Secondary | ICD-10-CM | POA: Diagnosis not present

## 2022-06-23 DIAGNOSIS — M791 Myalgia, unspecified site: Secondary | ICD-10-CM | POA: Diagnosis not present

## 2022-06-23 DIAGNOSIS — R5383 Other fatigue: Secondary | ICD-10-CM | POA: Diagnosis not present

## 2022-06-23 NOTE — Progress Notes (Signed)
[facility] [address] [place] [address] Dept: [phone] Dept Fax: [phone]  Chronic Care Office Visit  Subjective:    Patient ID: Shelby Cardenas, female    DOB: 04-Dec-1940, 81 y.o..   MRN: [id number]  Chief Complaint  Patient presents with   Follow-up    3 month f/u .  C/o still having fatigue and RT knee feeling unstable.     History of Present Illness:  Shelby Cardenas is in today for reassessment of fatigue, which had started back in Feb. She continues to find she tires easily during the day. She notes she feels like returning to bed within 45 minutes of getting up, though she resists this. As she had also been experiencing some dyspnea with exertion, I had referred her to cardiology. Apparently, her work up, including an echocardiogram, were normal. She had noted some generalized muscle achiness earlier in the summer. She notes today, her right leg seems much more inflamed. She did physical therapy yesterday, related to her right knee arthritis.    Shelby Cardenas has a past history of left knee meniscal injury. She had been having increased issues with this, so I referred her to orthopedics. She received an intraarticular knee joint injection. She found this helped for a while, but the pain recently returned. She has na upcoming appointment to follow up with orthopedics.  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   Osteoarthritis of hands, bilateral 10/12/2021   Intervertebral disc disorder with radiculopathy of lumbar region 09/06/2021   Closed wedge compression fracture of first lumbar vertebra ([place]) 06/09/2021   OAB (overactive bladder) 11/12/2020   Primary open angle  glaucoma of both eyes, moderate stage 12/12/2018   Mammogram abnormal 07/16/2018   Cystocele with prolapse 07/02/2018   Adenomatous colon polyp 04/11/2016   Mixed hyperlipidemia 02/06/2016   Osteopenia 02/06/2016   Diaphragmatic hernia 02/06/2016   Allergic rhinitis 02/06/2016   Past Surgical History:  Procedure Laterality Date   CESAREAN SECTION     CHOLECYSTECTOMY     KNEE SURGERY Left    NEUROPLASTY / TRANSPOSITION MEDIAN NERVE AT CARPAL TUNNEL  05/2021   Back   SKIN CANCER EXCISION     TONSILLECTOMY     Family History  Problem Relation Age of Onset   Arthritis Mother    Arthritis Father    Breast cancer Daughter    Breast cancer Paternal Aunt    Outpatient Medications Prior to Visit  Medication Sig Dispense Refill   Alpha Lipoic Acid 200 MG CAPS Take 200 mg by mouth daily.     Ascorbic Acid (VITAMIN C) 100 MG tablet Take 100 mg by mouth daily.     b complex vitamins capsule Take 1 capsule by mouth daily.     Calcium-Phosphorus-Vitamin D (CITRACAL +D3 PO) Take 1 capsule by mouth daily.     diclofenac (VOLTAREN) 75 MG EC tablet Take 75 mg by mouth 2 (two) times daily. Take one tablet by mouth. Twice a day with food as needed for pain/inflammation.     latanoprost (XALATAN) 0.005 % ophthalmic solution Place 1 drop into both eyes at bedtime.     meloxicam (MOBIC) 15 MG tablet Take 1 tablet daily with food for 5-7 days, then as needed  Multiple Vitamin (MULTIVITAMIN ADULT PO) Take 1 tablet by mouth daily.     Turmeric Curcumin 500 MG CAPS Take 1 capsule by mouth daily.     No facility-administered medications prior to visit.   Allergies  Allergen Reactions   Macrobid [Nitrofurantoin] Rash   Boniva [Ibandronic Acid] Other (See Comments)    Muscle aches /headache   Gabapentin     Dizzy and fell when taking     Prednisone     Makes me feel weird    Tizanidine     Low bp   Tramadol Itching   Ciprofloxacin     weird feeling    Codeine Itching and Rash    Plaquenil [Hydroxychloroquine] Rash   Sulfa Antibiotics Itching and Rash     Objective:   Today's Vitals   06/23/22 1356  BP: 124/68  Pulse: 82  Temp: (!) 96.7 F (35.9 C)  TempSrc: Temporal  SpO2: 97%  Weight: 164 lb (74.4 kg)  Height: '5\' 4"'$  (1.626 m)   Body mass index is 28.15 kg/m.   General: Well developed, well nourished. No acute distress. Extremities: Good ROM of right knee. No joint swelling or tenderness.There is significant increased warmth over the   right lateral lower leg muscles compared to the left leg. Psych: Alert and oriented. Normal mood and affect.  Health Maintenance Due  Topic Date Due   INFLUENZA VACCINE  05/30/2022     Assessment & Plan:   1. Fatigue, unspecified type 2. Muscle pain Shelby Cardenas continues to complain of fairly profound fatigue. She has had some equivocal tests results over the past 8 months, while we investigated this, including an AN positive at 1:40 with a speckled pattern and a mildly elevated CK level. I do wonder if she could have a PMR or polymyositis picture that is brewing, which is also contributing to her fatigue. I will refer her to rheumatology for an assessment.  - Ambulatory referral to Rheumatology  3. Right knee pain, unspecified chronicity She will follow up with her orthopedist.  Return in about 3 months (around 09/23/2022).   Haydee Salter, MD

## 2022-06-28 DIAGNOSIS — M545 Low back pain, unspecified: Secondary | ICD-10-CM | POA: Diagnosis not present

## 2022-07-05 DIAGNOSIS — M545 Low back pain, unspecified: Secondary | ICD-10-CM | POA: Diagnosis not present

## 2022-07-09 NOTE — Progress Notes (Unsigned)
Name: Shelby Cardenas  MRN/ DOB: [id number], June 04, 1941    Age/ Sex: 81 y.o., female    PCP: Haydee Salter, MD   Reason for Endocrinology Evaluation: Osteoporosis      Date of Initial Endocrinology Evaluation: 01/02/2022    HPI: [name] is a 81 y.o. female with a past medical history of Low bone density, GERD, IBS, and discoid lupus ( Dx 2012)   The patient presented for initial endocrinology clinic visit on 01/02/2022  for consultative assistance with her Low bone density .   Pt was diagnosed with osteopenia:03/2021   Menarche at age : 74 Menopausal at age : late 17's  Fracture Hx: yes , at the L1  on MRI . S/P kyphoplasty Hx of HRT: yes but did not tolerate  FH of osteoporosis or hip fracture: not known  Prior Hx of anti-estrogenic therapy :no  Prior Hx of anti-resorptive therapy : Alendronate , Boniva - achy, headaches and not feeling well .  She was scheduled to start Prolia on 4//2023 but canceled that appointment and did not want to reschedule    Of note, the pt has positive ANA  No prior exposure to radiation     SUBJECTIVE:    Today (07/10/22): [name] is here for a follow up on osteoporosis management.   She is currently  in PT for back pain and knee pains  Denies constipation or diarrhea  She initially agreed to Prolia but changes her mind  She has chronic fatigue, pending rheumatology  NO recent intra-articular injection   She is on Citracal - Vit D ( 650 /1000 iu ) BID  Whole produce fruits  3 tabs daily  Whole produce veggies 3 tabs daily        HISTORY:  Past Medical History:  Past Medical History:  Diagnosis Date   Adenomatous colon polyp 04/11/2016   Allergic rhinitis 02/06/2016   Closed wedge compression fracture of first lumbar vertebra ([place]) 06/09/2021   Cystocele with prolapse 07/02/2018   Diaphragmatic hernia 02/06/2016   GERD (gastroesophageal reflux disease)    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   Incontinence  of feces 10/12/2021   Intervertebral disc disorder with radiculopathy of lumbar region 09/06/2021   Lupus ([place])    Mammogram abnormal 07/16/2018   Formatting of this note might be different from the original. 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 2014: Fracture fibula 2017: -1.8 2019: -1.4 2022: L1 compression fracture   Positive ANA (antinuclear antibody) 10/17/2021   09-2021: 1:40, speckled pattern   Primary open angle glaucoma of both eyes, moderate stage 12/12/2018   Past Surgical History:  Past Surgical History:  Procedure Laterality Date   CESAREAN SECTION     CHOLECYSTECTOMY     KNEE SURGERY Left    NEUROPLASTY / TRANSPOSITION MEDIAN NERVE AT CARPAL TUNNEL  05/2021   Back   SKIN CANCER EXCISION     TONSILLECTOMY      Social History:  reports that she has never smoked. She has never used smokeless tobacco. She reports that she does not currently use alcohol. She reports that she does not use drugs. Family History: family history includes Arthritis in her father and mother; Breast cancer in her daughter and paternal aunt.   HOME MEDICATIONS: Allergies as of 07/10/2022  Reactions   Macrobid [nitrofurantoin] Rash   Boniva [ibandronic Acid] Other (See Comments)   Muscle aches /headache   Gabapentin    Dizzy and fell when taking    Prednisone    Makes me feel weird   Tizanidine    Low bp   Tramadol Itching   Ciprofloxacin    weird feeling   Codeine Itching, Rash   Plaquenil [hydroxychloroquine] Rash   Sulfa Antibiotics Itching, Rash        Medication List        Accurate as of July 10, 2022  9:58 AM. If you have any questions, ask your nurse or doctor.          Alpha Lipoic Acid 200 MG Caps Take 200 mg by mouth daily.   b complex vitamins capsule Take 1 capsule  by mouth daily.   CITRACAL +D3 PO Take 1 capsule by mouth daily.   diclofenac 75 MG EC tablet Commonly known as: VOLTAREN Take 75 mg by mouth 2 (two) times daily. Take one tablet by mouth. Twice a day with food as needed for pain/inflammation.   latanoprost 0.005 % ophthalmic solution Commonly known as: XALATAN Place 1 drop into both eyes at bedtime.   meloxicam 15 MG tablet Commonly known as: MOBIC Take 1 tablet daily with food for 5-7 days, then as needed   MULTIVITAMIN ADULT PO Take 1 tablet by mouth daily.   Turmeric Curcumin 500 MG Caps Take 1 capsule by mouth daily.   vitamin C 100 MG tablet Take 100 mg by mouth daily.          REVIEW OF SYSTEMS: A comprehensive ROS was conducted with the patient and is negative except as per HPI     OBJECTIVE:  VS: BP 126/80 (BP Location: Left Arm, Patient Position: Sitting, Cuff Size: Large)   Pulse 87   Ht '5\' 4"'$  (1.626 m)   Wt 162 lb 3.2 oz (73.6 kg)   SpO2 98%   BMI 27.84 kg/m    Wt Readings from Last 3 Encounters:  07/10/22 162 lb 3.2 oz (73.6 kg)  06/23/22 164 lb (74.4 kg)  06/07/22 163 lb 1.3 oz (74 kg)     EXAM: General: Pt appears well and is in NAD  Eyes: External eye exam normal without stare, lid lag or exophthalmos.  EOM intact.    Neck: General: Supple without adenopathy. Thyroid: Thyroid size normal.  No goiter or nodules appreciated. No thyroid bruit.  Lungs: Clear with good BS bilat with no rales, rhonchi, or wheezes  Heart: Auscultation: RRR.  Abdomen: Normoactive bowel sounds, soft, nontender, without masses or organomegaly palpable  Extremities:  BL LE: No pretibial edema normal ROM and strength.  Mental Status: Judgment, insight: Intact Orientation: Oriented to time, place, and person Mood and affect: No depression, anxiety, or agitation     DATA REVIEWED:  Latest Reference Range & Units 01/02/22 10:26  Sodium 135 - 145 mEq/L 136  Potassium 3.5 - 5.1 mEq/L 4.4  Chloride 96 - 112 mEq/L  100  CO2 19 - 32 mEq/L 26  Glucose 70 - 99 mg/dL 83  BUN 6 - 23 mg/dL 16  Creatinine 0.40 - 1.20 mg/dL 0.73  Calcium 8.4 - 10.5 mg/dL 9.2  Albumin 3.5 - 5.2 g/dL 4.5  GFR >60.00 mL/min 77.67  VITD 30.00 - 100.00 ng/mL 36.27    Latest Reference Range & Units 04/18/22 15:54  TSH 0.35 - 5.50 uIU/mL 3.44     DXA 04/01/2021  The BMD measured at [facility] Sp[facility] L1-L4 is 1.001 g/cm2 with a T-score of -1.5. This patient is considered osteopenic/low bone mass according to [facility]) criteria.   The quality of the exam is good.   Site Region Measured Date Measured Age YA BMD Significant CHANGE T-score   AP Spine  L1-L4      04/01/2021    79.6         -1.5    1.001 g/cm2   DualFemur Total Left 04/01/2021    79.6         -0.7    0.922 g/cm2   DualFemur Total Mean 04/01/2021    79.6         -0.4    0.962 g/cm2   FRAX Major Osteoporotic Fracture: 13.2% Hip Fracture:                1.4%   MRI Spine 07/22/2021  Incomplete healing (continued marrow edema) at the L1 compression fracture with 40% height loss. 2. No new fracture or neural compression.  ASSESSMENT/PLAN/RECOMMENDATIONS:   Osteoporosis  :    - Pt meets clinical diagnosis of Osteoporosis given fragility compression fracture of the spine  - She is intolerant to Alendronate and Ibandronate  - Pt declined  PTH- analogues ( forteo/Tymlos) , she understands this is the first line of therapy for fragility fracture but is not keen on daily injections . - She initially agreed to Prolia but she canceled her initial visit for the injection as dhe felt the risk outweighs the benefit  -We discussed her risk of worsening bone density and high risk for future bone fractures without any treatment - She will be due to a repeat DXA by 03/2023, will defer this to PCP , pt will continue to follow up with PCP and I will be happy to see her again if she decides to pursue anti-resorptive or anabolic agents  -Given her extreme  fatigue and compression fracture, I did recommend screening her for Cushing syndrome as that is the only thing we have not tested her for, she would like to wait on this until after her appointment with rheumatology  Medications : Continue Citracal 650 mg twice daily      F/U  PRN  Signed electronically by: Mack Guise, MD  [facility] End[facility]in[facility]  [facility] [address] [place], [place] [address] Phone: [phone] FAX: [phone]   CC: Haydee Salter, [place] [address] Phone: [phone] Fax: [phone]   Return to Endocrinology clinic as below: Future Appointments  Date Time Provider [place]  08/30/2022  3:20 PM Haydee Salter, MD [facility]  09/12/2022  9:20 AM Benjamine Mola, Resa Miner, MD [facility] None

## 2022-07-10 ENCOUNTER — Ambulatory Visit: Payer: Medicare PPO | Admitting: Internal Medicine

## 2022-07-10 ENCOUNTER — Encounter: Payer: Self-pay | Admitting: Internal Medicine

## 2022-07-10 VITALS — BP 126/80 | HR 87 | Ht 64.0 in | Wt 162.2 lb

## 2022-07-10 DIAGNOSIS — M8008XG Age-related osteoporosis with current pathological fracture, vertebra(e), subsequent encounter for fracture with delayed healing: Secondary | ICD-10-CM

## 2022-07-10 DIAGNOSIS — M8080XS Other osteoporosis with current pathological fracture, unspecified site, sequela: Secondary | ICD-10-CM | POA: Diagnosis not present

## 2022-07-10 NOTE — Patient Instructions (Signed)
Continue Citracal 650 mg Twice daily   Weight bearing exercises help with bone health

## 2022-07-11 DIAGNOSIS — M545 Low back pain, unspecified: Secondary | ICD-10-CM | POA: Diagnosis not present

## 2022-07-11 NOTE — Telephone Encounter (Signed)
Pt archived in [email or web address].  Please advise if patient and/or provider wish to proceed with Prolia therpay.  

## 2022-07-12 DIAGNOSIS — M170 Bilateral primary osteoarthritis of knee: Secondary | ICD-10-CM | POA: Diagnosis not present

## 2022-07-12 DIAGNOSIS — M2391 Unspecified internal derangement of right knee: Secondary | ICD-10-CM | POA: Diagnosis not present

## 2022-07-13 ENCOUNTER — Encounter: Payer: Self-pay | Admitting: Family Medicine

## 2022-07-13 ENCOUNTER — Ambulatory Visit: Payer: Medicare PPO | Admitting: Family Medicine

## 2022-07-13 VITALS — BP 120/78 | HR 76 | Temp 100.2°F | Wt 160.8 lb

## 2022-07-13 DIAGNOSIS — J029 Acute pharyngitis, unspecified: Secondary | ICD-10-CM

## 2022-07-13 DIAGNOSIS — U071 COVID-19: Secondary | ICD-10-CM | POA: Diagnosis not present

## 2022-07-13 LAB — POC COVID19 BINAXNOW: SARS Coronavirus 2 Ag: POSITIVE — AB

## 2022-07-13 LAB — POCT INFLUENZA A/B
Influenza A, POC: NEGATIVE
Influenza B, POC: NEGATIVE

## 2022-07-13 LAB — POCT RAPID STREP A (OFFICE): Rapid Strep A Screen: NEGATIVE

## 2022-07-13 NOTE — Assessment & Plan Note (Signed)
COVID-19 Mild disease 5 days and to symptoms Discussed risk and benefit of anti-COVID medications Patient declined antivirals Recommend supportive care OTC remedies Quarantine and return precaution discussed

## 2022-07-13 NOTE — Patient Instructions (Signed)
For COVID, Quarantine at home for 5 days since the start of illness. After this you, may exit quarantine, but please still wear a mask indoors and outdoors for 5 more days.   Please be sure to drink plenty of fluids.   May also try Cepacol lozenges for sore throat  You may take the following OTC medications to help with symptoms:  For cough, use  cough syrups or other cough suppressants.  For headache, sore throat, fevers, muscle aches, chills, other pain, take ibuprofen or tylenol  For congestion, use nasal sprays, decongestants, or antihistamines  Please follow up if no improvement.   Go to ED if you have severe chest pain, fevers, shortness of breath or other worrisome symptoms.

## 2022-07-13 NOTE — Progress Notes (Signed)
Assessment/Plan:  Spent 20 minutes with patient discussing pros and cons and risk associated COVID Problem List Items Addressed This Visit       Other   COVID    COVID-19 Mild disease 5 days and to symptoms Discussed risk and benefit of anti-COVID medications Patient declined antivirals Recommend supportive care OTC remedies Quarantine and return precaution discussed      Other Visit Diagnoses     Sore throat    -  Primary   Relevant Orders   POCT rapid strep A (Completed)   POC COVID-19 (Completed)   POCT Influenza A/B (Completed)          Subjective:  HPI:  Shelby Cardenas is a 81 y.o. female who has OAB (overactive bladder); Primary open angle glaucoma of both eyes, moderate stage; Mixed hyperlipidemia; Mammogram abnormal; Osteopenia; Diaphragmatic hernia; Cystocele with prolapse; Allergic rhinitis; Adenomatous colon polyp; Closed wedge compression fracture of first lumbar vertebra ([place]); Intervertebral disc disorder with radiculopathy of lumbar region; Incontinence of feces; History of discoid lupus erythematosus; Osteoarthritis of hands, bilateral; Positive ANA (antinuclear antibody); GERD (gastroesophageal reflux disease); Lupus ([place]); and COVID on their problem list..   She  has a past medical history of Adenomatous colon polyp (04/11/2016), Allergic rhinitis (02/06/2016), Closed wedge compression fracture of first lumbar vertebra ([place]) (06/09/2021), Cystocele with prolapse (07/02/2018), Diaphragmatic hernia (02/06/2016), GERD (gastroesophageal reflux disease), History of discoid lupus erythematosus (10/12/2021), Incontinence of feces (10/12/2021), Intervertebral disc disorder with radiculopathy of lumbar region (09/06/2021), Lupus ([place]), Mammogram abnormal (07/16/2018), Mixed hyperlipidemia (02/06/2016), OAB (overactive bladder) (11/12/2020), Osteoarthritis of hands, bilateral (10/12/2021), Osteopenia (02/06/2016), Positive ANA (antinuclear antibody) (10/17/2021), and Primary open  angle glaucoma of both eyes, moderate stage (12/12/2018)..   She presents with chief complaint of Cough (Cough, sore throat, difficulty swallowing, runny nose x 5 days. ) .  COVID19. She complains of congestion, non productive cough, and sore throat.with no fever, chills, night sweats or weight loss. Onset of symptoms was  5 days ago and gradually improving.She is drinking plenty of fluids.  Patient tested positive for COVID today. Evaluation to date: none. Treatment to date: cough suppressants and supportive OTC care . Past history is significant for no history of pneumonia or bronchitis. Patient is non-smoker. COVID Vaccination status: Original series: Yes, Booster: Yes   ROS: The patient denies chest pain, dyspnea, wheezing or hemoptysis.  Past Surgical History:  Procedure Laterality Date   CESAREAN SECTION     CHOLECYSTECTOMY     KNEE SURGERY Left    NEUROPLASTY / TRANSPOSITION MEDIAN NERVE AT CARPAL TUNNEL  05/2021   Back   SKIN CANCER EXCISION     TONSILLECTOMY      Outpatient Medications Prior to Visit  Medication Sig Dispense Refill   Alpha Lipoic Acid 200 MG CAPS Take 200 mg by mouth daily.     Ascorbic Acid (VITAMIN C) 100 MG tablet Take 100 mg by mouth daily.     b complex vitamins capsule Take 1 capsule by mouth daily.     Calcium-Phosphorus-Vitamin D (CITRACAL +D3 PO) Take 1 capsule by mouth daily.     diclofenac (VOLTAREN) 75 MG EC tablet Take 75 mg by mouth 2 (two) times daily. Take one tablet by mouth. Twice a day with food as needed for pain/inflammation.     latanoprost (XALATAN) 0.005 % ophthalmic solution Place 1 drop into both eyes at bedtime.     meloxicam (MOBIC) 15 MG tablet Take 1 tablet daily with food for 5-7 days, then as  needed     Multiple Vitamin (MULTIVITAMIN ADULT PO) Take 1 tablet by mouth daily.     Turmeric Curcumin 500 MG CAPS Take 1 capsule by mouth daily.     No facility-administered medications prior to visit.    Family History  Problem  Relation Age of Onset   Arthritis Mother    Arthritis Father    Breast cancer Daughter    Breast cancer Paternal Aunt     Social History   Socioeconomic History   Marital status: Married    Spouse name: Not on file   Number of children: Not on file   Years of education: Not on file   Highest education level: Not on file  Occupational History   Not on file  Tobacco Use   Smoking status: Never   Smokeless tobacco: Never  Vaping Use   Vaping Use: Never used  Substance and Sexual Activity   Alcohol use: Not Currently   Drug use: Never   Sexual activity: Yes  Other Topics Concern   Not on file  Social History Narrative   Not on file   Social Determinants of Health   Financial Resource Strain: Low Risk  (02/01/2022)   Overall Financial Resource Strain (CARDIA)    Difficulty of Paying Living Expenses: Not hard at all  Food Insecurity: No Food Insecurity (02/01/2022)   Hunger Vital Sign    Worried About Running Out of Food in the Last Year: Never true    Ona in the Last Year: Never true  Transportation Needs: No Transportation Needs (02/01/2022)   PRAPARE - Hydrologist (Medical): No    Lack of Transportation (Non-Medical): No  Physical Activity: Sufficiently Active (02/01/2022)   Exercise Vital Sign    Days of Exercise per Week: 5 days    Minutes of Exercise per Session: 30 min  Stress: No Stress Concern Present (02/01/2022)   [place]    Feeling of Stress : Not at all  Social Connections: [place] (02/01/2022)   Social Connection and Isolation Panel [NHANES]    Frequency of Communication with Friends and Family: Twice a week    Frequency of Social Gatherings with Friends and Family: Twice a week    Attends Religious Services: More than 4 times per year    Active Member of [employer] or Organizations: Yes    Attends Archivist Meetings: 1 to 4 times per  year    Marital Status: Married  Human resources officer Violence: Not At Risk (02/01/2022)   Humiliation, Afraid, Rape, and Kick questionnaire    Fear of Current or Ex-Partner: No    Emotionally Abused: No    Physically Abused: No    Sexually Abused: No                                                                                                 Objective:  Physical Exam: BP 120/78 (BP Location: Left Arm, Patient Position: Sitting, Cuff Size: Large)   Pulse 76   Temp 100.2 F (  37.9 C) (Oral)   Wt 160 lb 12.8 oz (72.9 kg)   SpO2 98%   BMI 27.60 kg/m    General: No acute distress. Awake and conversant.  Eyes: Normal conjunctiva, anicteric. Round symmetric pupils.  ENT: Hearing grossly intact. No nasal discharge.  Neck: Neck is supple. No masses or thyromegaly.  Respiratory: Respirations are non-labored. No auditory wheezing.  CTA B Skin: Warm. No rashes or ulcers.  Psych: Alert and oriented. Cooperative, Appropriate mood and affect, Normal judgment.  CV: No cyanosis or JVD, RRR, no MRG MSK: Normal ambulation. No clubbing  Neuro: Sensation and CN II-XII grossly normal.        Alesia Banda, MD, MS

## 2022-07-23 DIAGNOSIS — M170 Bilateral primary osteoarthritis of knee: Secondary | ICD-10-CM | POA: Insufficient documentation

## 2022-07-23 DIAGNOSIS — M1711 Unilateral primary osteoarthritis, right knee: Secondary | ICD-10-CM | POA: Insufficient documentation

## 2022-07-23 DIAGNOSIS — M1712 Unilateral primary osteoarthritis, left knee: Secondary | ICD-10-CM | POA: Insufficient documentation

## 2022-07-31 ENCOUNTER — Ambulatory Visit: Payer: Medicare PPO | Admitting: Family Medicine

## 2022-07-31 ENCOUNTER — Encounter: Payer: Self-pay | Admitting: Family Medicine

## 2022-07-31 VITALS — BP 144/70 | HR 80 | Temp 98.3°F | Ht 64.0 in | Wt 156.8 lb

## 2022-07-31 DIAGNOSIS — U071 COVID-19: Secondary | ICD-10-CM

## 2022-07-31 DIAGNOSIS — R5383 Other fatigue: Secondary | ICD-10-CM

## 2022-07-31 NOTE — Progress Notes (Signed)
[facility] [address] [place] [address] Dept: [phone] Dept Fax: [phone]  Office Visit  Subjective:    Patient ID: Shelby Cardenas, female    DOB: 1941/07/14, 81 y.o..   MRN: [id number]  Chief Complaint  Patient presents with   Acute Visit    C/o still feeling very fatigue, cough after having Covid 2 weeks ago.  Has been taking Robitussin, Delysm, and Advil.       History of Present Illness:  Patient is in today for evaluation of a significant worsening of her fatigue. [name] has been seeing me since Feb. with ongoing fatigue issues. However, recently, she was seen and tested positive for COVID-19. The fatigue has gotten remarkably worse since that time. She also continues to have some intermittent cough. Her husband also contracted COVID, but he has improved. She has been taking Delsym, Robitussin, and Advil.  Past Medical History: Patient Active Problem List   Diagnosis Date Noted   Osteoarthritis of right knee 07/23/2022   Osteoarthritis of left knee 07/23/2022   COVID 07/13/2022   GERD (gastroesophageal reflux disease) 06/06/2022   Lupus ([place]) 06/06/2022   Positive ANA (antinuclear antibody) 10/17/2021   Incontinence of feces 10/12/2021   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   Osteoarthritis of hands, bilateral 10/12/2021   Intervertebral disc disorder with radiculopathy of lumbar region 09/06/2021   Closed wedge compression fracture of first lumbar vertebra ([place]) 06/09/2021   OAB (overactive bladder) 11/12/2020   Primary open angle glaucoma of both eyes, moderate stage 12/12/2018   Mammogram abnormal 07/16/2018   Cystocele with prolapse 07/02/2018   Adenomatous colon polyp 04/11/2016   Mixed hyperlipidemia 02/06/2016   Osteopenia 02/06/2016   Diaphragmatic hernia 02/06/2016   Allergic rhinitis 02/06/2016   Past Surgical History:  Procedure Laterality Date   CESAREAN SECTION      CHOLECYSTECTOMY     KNEE SURGERY Left    NEUROPLASTY / TRANSPOSITION MEDIAN NERVE AT CARPAL TUNNEL  05/2021   Back   SKIN CANCER EXCISION     TONSILLECTOMY     Family History  Problem Relation Age of Onset   Arthritis Mother    Arthritis Father    Breast cancer Daughter    Breast cancer Paternal Aunt    Outpatient Medications Prior to Visit  Medication Sig Dispense Refill   Alpha Lipoic Acid 200 MG CAPS Take 200 mg by mouth daily.     Ascorbic Acid (VITAMIN C) 100 MG tablet Take 100 mg by mouth daily.     b complex vitamins capsule Take 1 capsule by mouth daily.     Calcium-Phosphorus-Vitamin D (CITRACAL +D3 PO) Take 1 capsule by mouth daily.     diclofenac (VOLTAREN) 75 MG EC tablet Take 75 mg by mouth 2 (two) times daily. Take one tablet by mouth. Twice a day with food as needed for pain/inflammation.     latanoprost (XALATAN) 0.005 % ophthalmic solution Place 1 drop into both eyes at bedtime.     meloxicam (MOBIC) 15 MG tablet Take 1 tablet daily with food for 5-7 days, then as needed     Multiple Vitamin (MULTIVITAMIN ADULT PO) Take 1 tablet by mouth daily.     Turmeric Curcumin 500 MG CAPS Take 1 capsule by mouth daily.     No facility-administered medications prior to visit.   Allergies  Allergen Reactions   Macrobid [Nitrofurantoin] Rash   Boniva [Ibandronic Acid] Other (See Comments)    Muscle aches /  headache   Gabapentin     Dizzy and fell when taking     Prednisone     Makes me feel weird    Tizanidine     Low bp   Tramadol Itching   Ciprofloxacin     weird feeling    Codeine Itching and Rash   Plaquenil [Hydroxychloroquine] Rash   Sulfa Antibiotics Itching and Rash   Objective:   Today's Vitals   07/31/22 1353  BP: (!) 144/70  Pulse: 80  Temp: 98.3 F (36.8 C)  TempSrc: Temporal  SpO2: 99%  Weight: 156 lb 12.8 oz (71.1 kg)  Height: '5\' 4"'$  (1.626 m)   Body mass index is 26.91 kg/m.   General: Well developed, well nourished. No acute  distress. HEENT: Normocephalic, non-traumatic. PERRL, EOMI. Conjunctiva clear. External ears   normal. Right EAC obscured by wax. Attempted to flush the ear and use a curette, but only   partially relieved the wax. Nose clear without congestion or rhinorrhea. Mucous membranes  moist. Oropharynx clear. Good dentition. Neck: Supple. No lymphadenopathy. No thyromegaly. Lungs: Clear to auscultation bilaterally. No wheezing, rales or rhonchi. CV: RRR without murmurs or rubs. Pulses 2+ bilaterally. Psych: Alert and oriented. Normal mood and affect.  Health Maintenance Due  Topic Date Due   COVID-19 Vaccine (3 - Pfizer risk series) 01/03/2020   INFLUENZA VACCINE  05/30/2022     Assessment & Plan:   1. COVID Recent COVID infection. Her fatigue was pre-existing, but has now worsened. We did discuss that fatigue is the most common long-COVID symptom that patients deal with. She is still within a 56-monthwindow of her infection, so it is too early to say if this will be prolonged or not.  2. Fatigue, unspecified type I will check some repeat albs due to the change in her status. Otherwise, she will be seeing the rheumatologist in 2 weeks for further evaluation. I have had concerns for a possible autoimmune component to her fatigue.  - CBC - Comprehensive metabolic panel - TSH - CK (Creatine Kinase)   Return in about 4 weeks (around 08/28/2022) for Reassessment.   SHaydee Salter MD

## 2022-08-01 LAB — COMPREHENSIVE METABOLIC PANEL
ALT: 27 U/L (ref 0–35)
AST: 33 U/L (ref 0–37)
Albumin: 3.6 g/dL (ref 3.5–5.2)
Alkaline Phosphatase: 91 U/L (ref 39–117)
BUN: 9 mg/dL (ref 6–23)
CO2: 25 mEq/L (ref 19–32)
Calcium: 8.9 mg/dL (ref 8.4–10.5)
Chloride: 94 mEq/L — ABNORMAL LOW (ref 96–112)
Creatinine, Ser: 0.65 mg/dL (ref 0.40–1.20)
GFR: 82.82 mL/min (ref >60.00)
Glucose, Bld: 99 mg/dL (ref 70–99)
Potassium: 4.4 mEq/L (ref 3.5–5.1)
Sodium: 129 mEq/L — ABNORMAL LOW (ref 135–145)
Total Bilirubin: 0.3 mg/dL (ref 0.2–1.2)
Total Protein: 7.3 g/dL (ref 6.0–8.3)

## 2022-08-01 LAB — CBC
HCT: 33.6 % — ABNORMAL LOW (ref 36.0–46.0)
Hemoglobin: 11.2 g/dL — ABNORMAL LOW (ref 12.0–15.0)
MCHC: 33.5 g/dL (ref 30.0–36.0)
MCV: 90 fl (ref 78.0–100.0)
Platelets: 458 10*3/uL — ABNORMAL HIGH (ref 150.0–400.0)
RBC: 3.73 Mil/uL — ABNORMAL LOW (ref 3.87–5.11)
RDW: 13.6 % (ref 11.5–15.5)
WBC: 12 10*3/uL — ABNORMAL HIGH (ref 4.0–10.5)

## 2022-08-01 LAB — TSH: TSH: 2.53 u[IU]/mL (ref 0.35–5.50)

## 2022-08-01 LAB — CK: Total CK: 82 U/L (ref 7–177)

## 2022-08-02 ENCOUNTER — Encounter: Payer: Self-pay | Admitting: Family Medicine

## 2022-08-02 DIAGNOSIS — M545 Low back pain, unspecified: Secondary | ICD-10-CM | POA: Diagnosis not present

## 2022-08-03 ENCOUNTER — Ambulatory Visit (INDEPENDENT_AMBULATORY_CARE_PROVIDER_SITE_OTHER): Payer: Medicare PPO

## 2022-08-03 ENCOUNTER — Ambulatory Visit
Admission: EM | Admit: 2022-08-03 | Discharge: 2022-08-03 | Disposition: A | Discharge status: Home / Self Care | Payer: Medicare PPO | Attending: Urgent Care | Admitting: Urgent Care

## 2022-08-03 ENCOUNTER — Ambulatory Visit: Payer: Self-pay

## 2022-08-03 DIAGNOSIS — Z8616 Personal history of COVID-19: Secondary | ICD-10-CM | POA: Diagnosis not present

## 2022-08-03 DIAGNOSIS — R059 Cough, unspecified: Secondary | ICD-10-CM | POA: Diagnosis not present

## 2022-08-03 DIAGNOSIS — J189 Pneumonia, unspecified organism: Secondary | ICD-10-CM

## 2022-08-03 MED ORDER — PROMETHAZINE-DM 6.25-15 MG/5ML PO SYRP: 5.0000 mL | ORAL_SOLUTION | Freq: Three times a day (TID) | ORAL | 0 refills | Status: DC | PRN

## 2022-08-03 MED ORDER — AMOXICILLIN-POT CLAVULANATE 875-125 MG PO TABS: 1.0000 | ORAL_TABLET | Freq: Two times a day (BID) | ORAL | 0 refills | Status: DC

## 2022-08-03 MED ORDER — BENZONATATE 100 MG PO CAPS: 100.0000 mg | ORAL_CAPSULE | Freq: Three times a day (TID) | ORAL | 0 refills | Status: DC | PRN

## 2022-08-03 NOTE — ED Triage Notes (Signed)
Pt states she had covid 3 weeks ago,states she is still coughing and yesterday had a fever. States she is feeling fatigued as well.

## 2022-08-03 NOTE — ED Provider Notes (Signed)
[facility]  Note:  This document was prepared using Systems analyst and may include unintentional dictation errors.  MRN: [id number] DOB: 1941/09/30  Subjective:   Shelby Cardenas is a 81 y.o. female presenting for 3-week history of persistent malaise, coughing, fever, chest congestion, fatigue.  Patient was diagnosed with COVID 3 weeks ago and has not fully recovered.  No history of smoking.  No history of respiratory disorders.  She does have a history of allergic rhinitis, lupus.  No current facility-administered medications for this encounter.  Current Outpatient Medications:    Alpha Lipoic Acid 200 MG CAPS, Take 200 mg by mouth daily., Disp: , Rfl:    Ascorbic Acid (VITAMIN C) 100 MG tablet, Take 100 mg by mouth daily., Disp: , Rfl:    b complex vitamins capsule, Take 1 capsule by mouth daily., Disp: , Rfl:    Calcium-Phosphorus-Vitamin D (CITRACAL +D3 PO), Take 1 capsule by mouth daily., Disp: , Rfl:    diclofenac (VOLTAREN) 75 MG EC tablet, Take 75 mg by mouth 2 (two) times daily. Take one tablet by mouth. Twice a day with food as needed for pain/inflammation., Disp: , Rfl:    latanoprost (XALATAN) 0.005 % ophthalmic solution, Place 1 drop into both eyes at bedtime., Disp: , Rfl:    meloxicam (MOBIC) 15 MG tablet, Take 1 tablet daily with food for 5-7 days, then as needed, Disp: , Rfl:    Multiple Vitamin (MULTIVITAMIN ADULT PO), Take 1 tablet by mouth daily., Disp: , Rfl:    Turmeric Curcumin 500 MG CAPS, Take 1 capsule by mouth daily., Disp: , Rfl:    Allergies  Allergen Reactions   Macrobid [Nitrofurantoin] Rash   Boniva [Ibandronic Acid] Other (See Comments)    Muscle aches /headache   Gabapentin     Dizzy and fell when taking     Prednisone     Makes me feel weird    Tizanidine     Low bp   Tramadol Itching   Ciprofloxacin     weird feeling    Codeine Itching and Rash   Plaquenil [Hydroxychloroquine] Rash   Sulfa  Antibiotics Itching and Rash    Past Medical History:  Diagnosis Date   Adenomatous colon polyp 04/11/2016   Allergic rhinitis 02/06/2016   Closed wedge compression fracture of first lumbar vertebra ([place]) 06/09/2021   Cystocele with prolapse 07/02/2018   Diaphragmatic hernia 02/06/2016   GERD (gastroesophageal reflux disease)    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 2014: Fracture fibula 2017: -1.8 2019: -1.4 2022: L1 compression fracture   Positive ANA (antinuclear antibody) 10/17/2021   09-2021: 1:40, speckled pattern   Primary open angle glaucoma of both eyes, moderate stage 12/12/2018     Past Surgical History:  Procedure Laterality Date   CESAREAN SECTION     CHOLECYSTECTOMY     KNEE SURGERY Left    NEUROPLASTY / TRANSPOSITION MEDIAN NERVE AT CARPAL TUNNEL  05/2021   Back   SKIN CANCER EXCISION     TONSILLECTOMY  Family History  Problem Relation Age of Onset   Arthritis Mother    Arthritis Father    Breast cancer Daughter    Breast cancer Paternal Aunt     Social History   Tobacco Use   Smoking status: Never   Smokeless tobacco: Never  Vaping Use   Vaping Use: Never used  Substance Use Topics   Alcohol use: Not Currently   Drug use: Never    ROS   Objective:   Vitals: BP (!) 125/54 (BP Location: Left Arm)   Pulse 88   Temp 99 F (37.2 C) (Oral)   Resp 16   SpO2 93%   Physical Exam Constitutional:      General: She is not in acute distress.    Appearance: Normal appearance. She is  well-developed. She is not ill-appearing, toxic-appearing or diaphoretic.  HENT:     Head: Normocephalic and atraumatic.     Nose: Nose normal.     Mouth/Throat:     Mouth: Mucous membranes are moist.  Eyes:     General: No scleral icterus.       Right eye: No discharge.        Left eye: No discharge.     Extraocular Movements: Extraocular movements intact.  Cardiovascular:     Rate and Rhythm: Normal rate and regular rhythm.     Heart sounds: Normal heart sounds. No murmur heard.    No friction rub. No gallop.  Pulmonary:     Effort: Pulmonary effort is normal. No respiratory distress.     Breath sounds: No stridor. No wheezing, rhonchi or rales.     Comments: Slight decrease in lung sounds in mid to bibasilar fields bilaterally. Chest:     Chest wall: No tenderness.  Skin:    General: Skin is warm and dry.  Neurological:     General: No focal deficit present.     Mental Status: She is alert and oriented to person, place, and time.  Psychiatric:        Mood and Affect: Mood normal.        Behavior: Behavior normal.     DG Chest 2 View  Result Date: 08/03/2022 CLINICAL DATA:  Persistent cough. EXAM: CHEST - 2 VIEW COMPARISON:  None available currently. FINDINGS: The heart size and mediastinal contours are within normal limits. Left lung is clear. Ill-defined large right middle lobe opacity is noted concerning for pneumonia or atelectasis, although underlying neoplasm cannot be excluded. The visualized skeletal structures are unremarkable. IMPRESSION: Large ill-defined right middle lobe opacity is noted concerning for pneumonia or atelectasis, although underlying neoplasm cannot be excluded. CT scan is recommended for further evaluation. These results will be called to the ordering clinician or representative by the Radiologist Assistant, and communication documented in the PACS or zVision Dashboard. Electronically Signed   By: Marijo Conception M.D.   On: 08/03/2022 13:54     Assessment and Plan :   PDMP not reviewed this encounter.  1. Pneumonia of right middle lobe due to infectious organism   2. History of COVID-19     Case report received from [facility].  Discussed findings with patient and her family member.  We will treat for community-acquired pneumonia of the right middle lobe likely secondary to COVID-19.  Recommended supportive care.  Strict ER precautions.  Otherwise, recheck and repeat chest x-ray in 4 weeks. Counseled patient on potential for adverse effects with medications prescribed/recommended today, ER and return-to-clinic precautions discussed, patient verbalized  understanding.    Jaynee Eagles, [place] 08/03/22 [id number]

## 2022-08-04 ENCOUNTER — Encounter: Payer: Self-pay | Admitting: Family Medicine

## 2022-08-04 ENCOUNTER — Ambulatory Visit: Payer: Medicare PPO | Admitting: Family Medicine

## 2022-08-04 VITALS — BP 118/62 | HR 77 | Temp 96.0°F | Ht 64.0 in | Wt 158.6 lb

## 2022-08-04 DIAGNOSIS — J189 Pneumonia, unspecified organism: Secondary | ICD-10-CM | POA: Diagnosis not present

## 2022-08-04 MED ORDER — HYDROCODONE BIT-HOMATROP MBR 5-1.5 MG/5ML PO SOLN: 5.0000 mL | Freq: Three times a day (TID) | ORAL | 0 refills | Status: DC | PRN

## 2022-08-04 NOTE — Progress Notes (Signed)
[facility] [address] [place] [address] Dept: [phone] Dept Fax: [phone]  Office Visit  Subjective:    Patient ID: Shelby Cardenas, female    DOB: 1941/01/05, 81 y.o..   MRN: [id number]  Chief Complaint  Patient presents with   Follow-up    F/u on labs. Diagnosed with Pneumonia.      History of Present Illness:  Patient is in today for reassessment. I had seen Shelby Cardenas on Monday (10/2) with a complaint of significant fatigue. She had tested positive for COVID on 9/14. The fatigue had significantly worsened since that time. She also was having some intermittent cough. I had checked a CBC which returned showing a mildly increased WBC. I had reached out to her to request she come back in for reassessment. However, yesterday, her symptoms were worse. She went to UC. Her chest x-ray showed a RML pneumonia. She is now on Augmentin. She was given Tessalon and phenergan/DM for cough. She did not feel like either helped last night.  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   Closed wedge compression fracture of first lumbar vertebra ([place]) 06/09/2021   OAB (overactive bladder) 11/12/2020   Primary open angle glaucoma of both eyes, moderate stage 12/12/2018   Mammogram abnormal 07/16/2018   Cystocele with prolapse 07/02/2018   Adenomatous colon polyp 04/11/2016   Mixed hyperlipidemia 02/06/2016   Osteopenia 02/06/2016   Diaphragmatic hernia  02/06/2016   Allergic rhinitis 02/06/2016   Past Surgical History:  Procedure Laterality Date   CESAREAN SECTION     CHOLECYSTECTOMY     KNEE SURGERY Left    NEUROPLASTY / TRANSPOSITION MEDIAN NERVE AT CARPAL TUNNEL  05/2021   Back   SKIN CANCER EXCISION     TONSILLECTOMY     Family History  Problem Relation Age of Onset   Arthritis Mother    Arthritis Father    Breast cancer Daughter    Breast cancer Paternal Aunt    Outpatient Medications Prior to Visit  Medication Sig Dispense Refill   Alpha Lipoic Acid 200 MG CAPS Take 200 mg by mouth daily.     amoxicillin-clavulanate (AUGMENTIN) 875-125 MG tablet Take 1 tablet by mouth 2 (two) times daily. 20 tablet 0   Ascorbic Acid (VITAMIN C) 100 MG tablet Take 100 mg by mouth daily.     b complex vitamins capsule Take 1 capsule by mouth daily.     benzonatate (TESSALON) 100 MG capsule Take 1-2 capsules (100-200 mg total) by mouth 3 (three) times daily as needed for cough. 60 capsule 0   Calcium-Phosphorus-Vitamin D (CITRACAL +D3 PO) Take 1 capsule by mouth daily.     diclofenac (VOLTAREN) 75 MG EC tablet Take 75 mg by mouth 2 (two) times daily. Take one tablet by mouth. Twice a day with food as needed for pain/inflammation.     latanoprost (XALATAN) 0.005 % ophthalmic solution Place 1 drop into both eyes at bedtime.     meloxicam (MOBIC) 15 MG tablet Take 1 tablet daily with food for 5-7  days, then as needed     Multiple Vitamin (MULTIVITAMIN ADULT PO) Take 1 tablet by mouth daily.     promethazine-dextromethorphan (PROMETHAZINE-DM) 6.25-15 MG/5ML syrup Take 5 mLs by mouth 3 (three) times daily as needed for cough. 100 mL 0   Turmeric Curcumin 500 MG CAPS Take 1 capsule by mouth daily.     No facility-administered medications prior to visit.   Allergies  Allergen Reactions   Macrobid [Nitrofurantoin] Rash   Boniva [Ibandronic Acid] Other (See Comments)    Muscle aches /headache   Gabapentin     Dizzy and fell when taking      Prednisone     Makes me feel weird    Tizanidine     Low bp   Tramadol Itching   Ciprofloxacin     weird feeling    Codeine Itching and Rash   Plaquenil [Hydroxychloroquine] Rash   Sulfa Antibiotics Itching and Rash     Objective:   Today's Vitals   08/04/22 1049  BP: 118/62  Pulse: 77  Temp: (!) 96 F (35.6 C)  TempSrc: Temporal  SpO2: 93%  Weight: 158 lb 9.6 oz (71.9 kg)  Height: '5\' 4"'$  (1.626 m)   Body mass index is 27.22 kg/m.   General: Well developed, well nourished. No acute distress. Lungs: Clear to auscultation bilaterally. No wheezing, rales or rhonchi. Psych: Alert and oriented. Normal mood and affect.  Health Maintenance Due  Topic Date Due   COVID-19 Vaccine (3 - Pfizer risk series) 01/03/2020   INFLUENZA VACCINE  05/30/2022   Imaging: Chest x-ray (08/03/2022) IMPRESSION: Large ill-defined right middle lobe opacity is noted concerning for pneumonia or atelectasis, although underlying neoplasm cannot be excluded. CT scan is recommended for further evaluation. These results will be called to the ordering clinician or representative by the Radiologist Assistant, and communication documented in the PACS or zVision Dashboard.  Lab Results Last CBC Lab Results  Component Value Date   WBC 12.0 (H) 07/31/2022   HGB 11.2 (L) 07/31/2022   HCT 33.6 (L) 07/31/2022   MCV 90.0 07/31/2022   MCH 29.8 06/02/2021   RDW 13.6 07/31/2022   PLT 458.0 (H) 29/47/6546   Last metabolic panel Lab Results  Component Value Date   GLUCOSE 99 07/31/2022   NA 129 (L) 07/31/2022   K 4.4 07/31/2022   CL 94 (L) 07/31/2022   CO2 25 07/31/2022   BUN 9 07/31/2022   CREATININE 0.65 07/31/2022   GFRNONAA >60 06/02/2021   CALCIUM 8.9 07/31/2022   PROT 7.3 07/31/2022   ALBUMIN 3.6 07/31/2022   BILITOT 0.3 07/31/2022   ALKPHOS 91 07/31/2022   AST 33 07/31/2022   ALT 27 07/31/2022   ANIONGAP 8 06/02/2021   Last thyroid functions Lab Results  Component Value Date   TSH  2.53 07/31/2022     Assessment & Plan:   1. Pneumonia of right middle lobe due to infectious organism Shelby Cardenas should continue her Augmentin. We will plan for a repeat CXR in 4 weeks. I will add some hydrocodone cough syrup to see if this helps reduce her nighttime cough and help her rest better. We reviewed s/s of worsening pneumonia. If these occur she should follow-up in the ED.  - HYDROcodone bit-homatropine (HYCODAN) 5-1.5 MG/5ML syrup; Take 5 mLs by mouth every 8 (eight) hours as needed for cough.  Dispense: 120 mL; Refill: 0   Return for As scheduled.   Haydee Salter, MD

## 2022-08-08 NOTE — Progress Notes (Signed)
Office Visit Note  Patient: Shelby Cardenas             Date of Birth: 05-05-1941           MRN: [id number]             PCP: Haydee Salter, MD Referring: Haydee Salter, MD Visit Date: 08/09/2022 Occupation: Retired educator  Subjective:  New Patient (Initial Visit) (Extreme fatigue for the past few years. )   History of Present Illness: Shelby Cardenas is a 81 y.o. female here for evaluation of fatigue and muscle and joint pains with positive ANA. Additional inflammatory markers including ESR, CRP, and CK were unremarkable.  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  She reports previously identifying an ionized water supplement since about 12 years ago that greatly improved her inflammation and skin disease.  She has had some degree of chronic joint pain intermittently but has not been severely limiting her physical activities until within about the past 2 years.  Symptoms markedly increased after August of last year when she sustained a L1 compression fracture while emptying a dehumidifier.  Back pain has improved but also experience a generalized worsening with deconditioning and increasing of hip and knee pains and less activity tolerance.  She was prescribed diclofenac and meloxicam for osteoarthritis joint pains.  She also takes ibuprofen as needed but does not like taking extra medications except when completely necessary.  She is taking a turmeric supplement that is helpful.  She was prescribed Boniva for osteoporosis based on fragility fracture but did not tolerate medication and avoided starting any alternative due to concern of fatigue side effect. She has worked with physical therapy particularly for her left knee and hip weakness and pain but still has symptoms from this often bothering her at night or catching or instability  at the knee while walking. She recently got sick with [id number] complicated by pneumonia about 3 weeks of total symptoms now and is on Augmentin for this. She has severely reduced exertion intolerance and ongoing productive coughing since this started. She denies oral or nasal ulcers, lymphadenopathy, skin rashes, or raynaud's symptoms. No visible joint swelling or erythema. Labs from 09/2021 reviewed with low positive ANA 1:40 cytoplasmic but normal inflammatory markers. She has been negative for TSH, B12, or vitamin D abnormality.  Activities of Daily Living:  Patient reports morning stiffness for 10 minutes.   Patient Denies nocturnal pain.  Difficulty dressing/grooming: Denies Difficulty climbing stairs: Denies Difficulty getting out of chair: Denies Difficulty using hands for taps, buttons, cutlery, and/or writing: Denies  Review of Systems  Constitutional:  Positive for fatigue.  HENT:  Negative for mouth sores and mouth dryness.   Eyes:  Positive for dryness.  Respiratory:  Positive for shortness of breath.   Cardiovascular:  Negative for chest pain and palpitations.  Gastrointestinal:  Negative for blood in stool, constipation and diarrhea.  Endocrine: Negative for increased urination.  Musculoskeletal:  Positive for joint pain, gait problem, joint pain, myalgias, muscle weakness, morning stiffness, muscle tenderness and myalgias. Negative for joint swelling.  Skin:  Positive for sensitivity to sunlight. Negative for color change, rash and hair loss.  Neurological:  Positive for dizziness. Negative for headaches.  Hematological:  Negative for swollen glands.  Psychiatric/Behavioral:  Negative for depressed mood and sleep disturbance. The patient is not nervous/anxious.  PMFS History:  Patient Active Problem List   Diagnosis Date Noted   Osteoarthritis of right knee 07/23/2022   Osteoarthritis of left knee 07/23/2022   COVID 07/13/2022   GERD (gastroesophageal reflux  disease) 06/06/2022   Lupus ([place]) 06/06/2022   Positive ANA (antinuclear antibody) 10/17/2021   Incontinence of feces 10/12/2021   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   Osteoarthritis of hands, bilateral 10/12/2021   Intervertebral disc disorder with radiculopathy of lumbar region 09/06/2021   Closed wedge compression fracture of first lumbar vertebra ([place]) 06/09/2021   OAB (overactive bladder) 11/12/2020   Primary open angle glaucoma of both eyes, moderate stage 12/12/2018   Mammogram abnormal 07/16/2018   Cystocele with prolapse 07/02/2018   Adenomatous colon polyp 04/11/2016   Mixed hyperlipidemia 02/06/2016   Osteopenia 02/06/2016   Diaphragmatic hernia 02/06/2016   Allergic rhinitis 02/06/2016    Past Medical History:  Diagnosis Date   Adenomatous colon polyp 04/11/2016   Allergic rhinitis 02/06/2016   Closed wedge compression fracture of first lumbar vertebra ([place]) 06/09/2021   Cystocele with prolapse 07/02/2018   Diaphragmatic hernia 02/06/2016   GERD (gastroesophageal reflux disease)    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   Incontinence of feces 10/12/2021   Intervertebral disc disorder with radiculopathy of lumbar region 09/06/2021   Lupus ([place])    Mammogram abnormal 07/16/2018   Formatting of this note might be different from the original. 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 2014: Fracture fibula 2017: -1.8 2019: -1.4 2022: L1 compression fracture   Positive ANA (antinuclear antibody) 10/17/2021   09-2021: 1:40, speckled pattern   Primary open angle glaucoma of both eyes, moderate stage 12/12/2018    Family History  Problem Relation Age of Onset   Arthritis Mother    Arthritis Father    Leukemia Father    Liver disease Brother    Diabetes Brother    Breast  cancer Paternal Aunt    Breast cancer Daughter    Past Surgical History:  Procedure Laterality Date   CESAREAN SECTION     CHOLECYSTECTOMY     KNEE SURGERY Left    NEUROPLASTY / TRANSPOSITION MEDIAN NERVE AT [place]  05/2021   Back   SKIN CANCER EXCISION     TONSILLECTOMY     Social History   Social History Narrative   Not on file   Immunization History  Administered Date(s) Administered   Fluad Quad(high Dose 65+) 09/13/2021   Influenza, High Dose Seasonal PF 09/05/2013, 08/17/2016, 08/13/2017   PFIZER(Purple Top)SARS-COV-2 Vaccination 11/15/2019, 12/06/2019   Pneumococcal Conjugate-13 05/05/2015   Pneumococcal Polysaccharide-23 03/05/2007   Td 01/13/2022   Td,absorbed, Preservative Free, Adult Use, Lf Unspecified 10/30/1996   Tdap 03/05/2007   Zoster Recombinat (Shingrix) 12/07/2021, 04/12/2022   Zoster, Live 10/20/2009     Objective: Vital Signs: BP 112/71 (BP Location: Right Arm, Patient Position: Sitting, Cuff Size: Normal)   Pulse 82   Resp 16   Ht $R'5\' 3"'bK$  (1.6 m)   Wt 150 lb 9.6 oz (68.3 kg)   BMI 26.68 kg/m    Physical Exam HENT:     Right Ear: External ear normal.     Left Ear: External ear normal.     Mouth/Throat:     Mouth: Mucous membranes are moist.     Pharynx: Oropharynx  is clear.  Eyes:     Conjunctiva/sclera: Conjunctivae normal.  Cardiovascular:     Rate and Rhythm: Normal rate and regular rhythm.  Pulmonary:     Effort: Pulmonary effort is normal.     Comments: Inspiratory crackles on lungs bilaterally Coughing productive of scant yellow mucus Musculoskeletal:     Comments: 1+ pedal edema bilaterally extending less than halfway to the knees  Lymphadenopathy:     Cervical: No cervical adenopathy.  Skin:    General: Skin is warm and dry.     Findings: Bruising present. No rash.  Neurological:     Mental Status: She is alert.  Psychiatric:        Mood and Affect: Mood normal.      Musculoskeletal Exam:  Neck full ROM no  tenderness Shoulders slightly forward position posture, some decreased abduction external rotation range of motion Elbows full ROM no tenderness or swelling Wrists full ROM no tenderness or swelling Fingers full ROM no tenderness or swelling extensive Heberden's nodes throughout bilateral hands Knees full ROM no tenderness or swelling patellofemoral crepitus present bilaterally Ankles full ROM no tenderness or swelling   Investigation: No additional findings.  Imaging: DG Chest 2 View  Result Date: 08/03/2022 CLINICAL DATA:  Persistent cough. EXAM: CHEST - 2 VIEW COMPARISON:  None available currently. FINDINGS: The heart size and mediastinal contours are within normal limits. Left lung is clear. Ill-defined large right middle lobe opacity is noted concerning for pneumonia or atelectasis, although underlying neoplasm cannot be excluded. The visualized skeletal structures are unremarkable. IMPRESSION: Large ill-defined right middle lobe opacity is noted concerning for pneumonia or atelectasis, although underlying neoplasm cannot be excluded. CT scan is recommended for further evaluation. These results will be called to the ordering clinician or representative by the Radiologist Assistant, and communication documented in the PACS or zVision Dashboard. Electronically Signed   By: Marijo Conception M.D.   On: 08/03/2022 13:54    Recent Labs: Lab Results  Component Value Date   WBC 12.0 (H) 07/31/2022   HGB 11.2 (L) 07/31/2022   PLT 458.0 (H) 07/31/2022   NA 129 (L) 07/31/2022   K 4.4 07/31/2022   CL 94 (L) 07/31/2022   CO2 25 07/31/2022   GLUCOSE 99 07/31/2022   BUN 9 07/31/2022   CREATININE 0.65 07/31/2022   BILITOT 0.3 07/31/2022   ALKPHOS 91 07/31/2022   AST 33 07/31/2022   ALT 27 07/31/2022   PROT 7.3 07/31/2022   ALBUMIN 3.6 07/31/2022   CALCIUM 8.9 07/31/2022    Speciality Comments: No specialty comments available.  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  We will check her for specific antibody panel as above also serum complements.  Checking inflammatory markers sedimentation rate and CRP these may still be elevated due to recent pulmonary infection but will see if there is any discordance.  I expect we will need to follow-up with her again after recovering from the current acute illness to properly assess if return to baseline symptoms.  Closed wedge compression fracture of L1 vertebra with routine healing, subsequent encounter  Fragility fracture previously did  not tolerate oral bisphosphonates and is reticent towards alternative medical treatment for osteoporosis.  Currently supplementing with vitamin D not tolerating significant physical activity for weightbearing exercises.  History of discoid lupus erythematosus  Do not see any significantly active skin disease at this time.  Previous pathology reported as consistent with cutaneous lupus but I do not have specific findings for review at this time.  Primary osteoarthritis of both hands Primary osteoarthritis of right knee Primary osteoarthritis of left knee  Certainly has generalized osteoarthritis of multiple areas.  I do not see any obvious instability or evidence of inflammatory joint changes.  The left knee symptoms could be consistent with locking such as degenerative meniscal injury but right now is not limiting her activities much as the fatigue or dyspnea on exertion.  Orders: Orders Placed This  Encounter  Procedures   RNP Antibody   Anti-Smith antibody   Anti-DNA antibody, double-stranded   C3 and C4   Sedimentation rate   C-reactive protein   No orders of the defined types were placed in this encounter.    Follow-Up Instructions: Return in about 2 months (around 10/09/2022) for New pt lupus?/fatigue/OA f/u 22mos.   Collier Salina, MD  Note - This record has been created using [employer].  Chart creation errors have been sought, but may not always  have been located. Such creation errors do not reflect on  the standard of medical care.

## 2022-08-09 ENCOUNTER — Ambulatory Visit: Payer: Medicare PPO | Attending: Internal Medicine | Admitting: Internal Medicine

## 2022-08-09 ENCOUNTER — Encounter: Payer: Self-pay | Admitting: Internal Medicine

## 2022-08-09 VITALS — BP 112/71 | HR 82 | Resp 16 | Ht 63.0 in | Wt 150.6 lb

## 2022-08-09 DIAGNOSIS — Z872 Personal history of diseases of the skin and subcutaneous tissue: Secondary | ICD-10-CM | POA: Diagnosis not present

## 2022-08-09 DIAGNOSIS — R768 Other specified abnormal immunological findings in serum: Secondary | ICD-10-CM | POA: Diagnosis not present

## 2022-08-09 DIAGNOSIS — M1711 Unilateral primary osteoarthritis, right knee: Secondary | ICD-10-CM

## 2022-08-09 DIAGNOSIS — M1712 Unilateral primary osteoarthritis, left knee: Secondary | ICD-10-CM | POA: Diagnosis not present

## 2022-08-09 DIAGNOSIS — M19041 Primary osteoarthritis, right hand: Secondary | ICD-10-CM | POA: Diagnosis not present

## 2022-08-09 DIAGNOSIS — S32010D Wedge compression fracture of first lumbar vertebra, subsequent encounter for fracture with routine healing: Secondary | ICD-10-CM | POA: Diagnosis not present

## 2022-08-09 DIAGNOSIS — M19042 Primary osteoarthritis, left hand: Secondary | ICD-10-CM | POA: Diagnosis not present

## 2022-08-10 LAB — C3 AND C4
C3 Complement: 212 mg/dL
C4 Complement: 26 mg/dL

## 2022-08-10 LAB — SEDIMENTATION RATE: Sed Rate: 125 mm/h — ABNORMAL HIGH (ref 0–30)

## 2022-08-10 LAB — C-REACTIVE PROTEIN: CRP: 111.1 mg/L — ABNORMAL HIGH (ref <8.0)

## 2022-08-10 LAB — ANTI-DNA ANTIBODY, DOUBLE-STRANDED: ds DNA Ab: <1 IU/mL

## 2022-08-10 LAB — RNP ANTIBODY: Ribonucleic Protein(ENA) Antibody, IgG: <1 AI

## 2022-08-10 LAB — ANTI-SMITH ANTIBODY: ENA SM Ab Ser-aCnc: <1 AI

## 2022-08-30 ENCOUNTER — Ambulatory Visit: Payer: Medicare PPO | Admitting: Family Medicine

## 2022-08-30 ENCOUNTER — Ambulatory Visit (INDEPENDENT_AMBULATORY_CARE_PROVIDER_SITE_OTHER): Payer: Medicare PPO

## 2022-08-30 VITALS — BP 124/70 | HR 88 | Temp 96.6°F | Ht 63.0 in | Wt 155.8 lb

## 2022-08-30 DIAGNOSIS — R5383 Other fatigue: Secondary | ICD-10-CM

## 2022-08-30 DIAGNOSIS — J189 Pneumonia, unspecified organism: Secondary | ICD-10-CM

## 2022-08-30 NOTE — Progress Notes (Signed)
[facility] [address] [place] [address] Dept: [phone] Dept Fax: [phone]  Office Visit  Subjective:    Patient ID: Mickie Bail, female    DOB: May 12, 1941, 81 y.o..   MRN: [id number]  Chief Complaint  Patient presents with   Follow-up    3 week f/u,  wants chest x-ray, and discuss blood work RA that was done.    History of Present Illness:  Patient is in today for follow-up from her recent pneumonia. She had tested positive for COVID on 9/14. Although she had already been experiencing fatigue prior to this, the fatigue had significantly worsened since that time. She also was having some intermittent cough. I had checked a CBC on 10/2 which returned showing a mildly increased WBC. I had reached out to her to request she come back in for reassessment. However, on 10/5, her symptoms were worse. She went to UC. Her chest x-ray showed a RML pneumonia. She was treated with a course of Augmentin. She has had improvement in her cough. However, she is noting a persistent issue with some dyspnea when up and around. This is actually improved when she lies down on her back.   MS. Corle was seen by Dr. Benjamine Mola on 10/11 related to her fatigue and possible history of lupus. He ordered some blood tests and plans to see her back in early Dec.  Past Medical History: Patient Active Problem List   Diagnosis Date Noted   Osteoarthritis of right knee 07/23/2022   Osteoarthritis of left knee 07/23/2022   COVID 07/13/2022   GERD (gastroesophageal reflux disease) 06/06/2022   Lupus ([place]) 06/06/2022   Positive ANA (antinuclear antibody) 10/17/2021   Incontinence of feces 10/12/2021   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   Osteoarthritis of hands, bilateral 10/12/2021   Intervertebral disc disorder with radiculopathy of lumbar region 09/06/2021   Closed wedge compression fracture of first lumbar vertebra ([place]) 06/09/2021    OAB (overactive bladder) 11/12/2020   Primary open angle glaucoma of both eyes, moderate stage 12/12/2018   Mammogram abnormal 07/16/2018   Cystocele with prolapse 07/02/2018   Adenomatous colon polyp 04/11/2016   Mixed hyperlipidemia 02/06/2016   Osteopenia 02/06/2016   Diaphragmatic hernia 02/06/2016   Allergic rhinitis 02/06/2016   Past Surgical History:  Procedure Laterality Date   CESAREAN SECTION     CHOLECYSTECTOMY     KNEE SURGERY Left    NEUROPLASTY / TRANSPOSITION MEDIAN NERVE AT [place]  05/2021   Back   SKIN CANCER EXCISION     TONSILLECTOMY     Family History  Problem Relation Age of Onset   Arthritis Mother    Arthritis Father    Leukemia Father    Liver disease Brother    Diabetes Brother    Breast cancer Paternal Aunt    Breast cancer Daughter    Outpatient Medications Prior to Visit  Medication Sig Dispense Refill   Alpha Lipoic Acid 200 MG CAPS Take 200 mg by mouth daily.     Ascorbic Acid (VITAMIN C) 100 MG tablet Take 100 mg by mouth daily.     b complex vitamins capsule Take 1 capsule by mouth daily.     Calcium-Phosphorus-Vitamin D (CITRACAL +D3 PO) Take 1 capsule by mouth daily.     diclofenac (VOLTAREN) 75 MG EC tablet Take 75 mg by mouth 2 (two) times daily. Take one tablet by mouth. Twice a day with food as needed for pain/inflammation.  latanoprost (XALATAN) 0.005 % ophthalmic solution Place 1 drop into both eyes at bedtime.     meloxicam (MOBIC) 15 MG tablet      Multiple Vitamin (MULTIVITAMIN ADULT PO) Take 1 tablet by mouth daily.     Turmeric Curcumin 500 MG CAPS Take 1 capsule by mouth daily.     promethazine-dextromethorphan (PROMETHAZINE-DM) 6.25-15 MG/5ML syrup Take 5 mLs by mouth 3 (three) times daily as needed for cough. 100 mL 0   amoxicillin-clavulanate (AUGMENTIN) 875-125 MG tablet Take 1 tablet by mouth 2 (two) times daily. 20 tablet 0   benzonatate (TESSALON) 100 MG capsule Take 1-2 capsules (100-200 mg total) by mouth 3  (three) times daily as needed for cough. 60 capsule 0   HYDROcodone bit-homatropine (HYCODAN) 5-1.5 MG/5ML syrup Take 5 mLs by mouth every 8 (eight) hours as needed for cough. 120 mL 0   No facility-administered medications prior to visit.   Allergies  Allergen Reactions   Macrobid [Nitrofurantoin] Rash   Boniva [Ibandronic Acid] Other (See Comments)    Muscle aches /headache   Gabapentin     Dizzy and fell when taking     Prednisone     Makes me feel weird    Tizanidine     Low bp   Tramadol Itching   Ciprofloxacin     weird feeling    Codeine Itching and Rash   Plaquenil [Hydroxychloroquine] Rash   Sulfa Antibiotics Itching and Rash    Objective:   Today's Vitals   08/30/22 1523  BP: 124/70  Pulse: 88  Temp: (!) 96.6 F (35.9 C)  TempSrc: Temporal  SpO2: 96%  Weight: 155 lb 12.8 oz (70.7 kg)  Height: _0  (1.6 m)   Body mass index is 27.6 kg/m.   General: Well developed, well nourished. No acute distress. Psych: Alert and oriented. Normal mood and affect.  Health Maintenance Due  Topic Date Due   INFLUENZA VACCINE  05/30/2022   Imaging: Chest x-ray- There is a persistent infiltrate int he RML, though it is possibly mildly smaller than what was seen in the original CXR. There also appears to be some increased chest marking bilaterally.  Lab Results Component Ref Range & Units 3 wk ago 10 mo ago  CRP <8.0 mg/L 111.1 High   <1.0 R    Component Ref Range & Units 3 wk ago 10 mo ago  Sed Rate 0 - 30 mm/h 125 High   16 R      Assessment & Plan:   1. Pneumonia of right middle lobe due to infectious organism There has been very little clearing of the infiltrate on the CXR over the past month. I wonder how much COVID may have also impacted the appearance of the CXR and some of Ms. Brower symptoms. I will refer her to pulmonology for further assessment. I wonder if she might need a CT scan of the chest, but will leave that decision to the consultant.  - DG  Chest 2 View - Ambulatory referral to Pulmonology  2. Fatigue, unspecified type Plans to see Dr. Benjamine Mola back. It is unclear if the elevation of the ESR and CRP is significant or not, as she had been diagnosed with pneumonia about a week prior.  Return if symptoms worsen or fail to improve.   Haydee Salter, MD

## 2022-08-30 NOTE — Progress Notes (Unsigned)
F/u visit after RML pneumonia in early Oct. Still experiencing a productive cough and fatigue

## 2022-08-31 ENCOUNTER — Encounter: Payer: Self-pay | Admitting: Pulmonary Disease

## 2022-08-31 ENCOUNTER — Ambulatory Visit: Payer: Medicare PPO | Admitting: Pulmonary Disease

## 2022-08-31 VITALS — BP 124/68 | HR 83 | Temp 98.1°F | Ht 63.0 in | Wt 152.8 lb

## 2022-08-31 DIAGNOSIS — R5383 Other fatigue: Secondary | ICD-10-CM | POA: Diagnosis not present

## 2022-08-31 DIAGNOSIS — J189 Pneumonia, unspecified organism: Secondary | ICD-10-CM

## 2022-08-31 DIAGNOSIS — R9389 Abnormal findings on diagnostic imaging of other specified body structures: Secondary | ICD-10-CM

## 2022-08-31 DIAGNOSIS — L930 Discoid lupus erythematosus: Secondary | ICD-10-CM

## 2022-08-31 NOTE — Patient Instructions (Signed)
Community-acquired pneumonia: As we discussed today, this should continue to improve though it is normal for you to still have some fatigue and mucus production over the next few weeks. Drink plenty of fluids Eat unprocessed foods Gradually increase exercise Let me know if you have worsening chest congestion, mucus production, fever, or shortness of breath  Abnormal chest x-ray: Most of the changes seen on the chest x-ray yesterday are due to the pneumonia, however because there was an abnormal interstitial pattern I want to get a high-resolution CT scan of your chest. We will discuss that further once the CT scan results are back  Follow-up with me in 3 to 4 weeks to go over the results of the high-resolution CT scan of the chest.

## 2022-08-31 NOTE — Addendum Note (Signed)
Addended by: Valerie Salts on: 08/31/2022 11:23 AM   Modules accepted: Orders

## 2022-08-31 NOTE — Progress Notes (Signed)
Synopsis: Referred in November 2023 for cough, pneumonia after COVID.  Subjective:   PATIENT ID: Shelby Cardenas GENDER: female DOB: 08-27-1941, MRN: [id number]   HPI  Chief Complaint  Patient presents with   Consult    New PT, Covid in September, Pneumonia in October  X-Ray yesterday still shows Pneumonia (Right lobe)    Shelby Cardenas is here to see me today because she has been diagnosed with pneumonia.  She thought she had strep throat on 9/14, was diagnosed with COVID then later pneumonia in early October.  She says that she had a cough initially that didn't improve, she ended up having a CBC that showed a high WBC, she had a fever and she ended up at Urgent care and was diagnosed with pneumonia.  Around that time sh was choking on food.  She was treated with Augmentin and a cough medicine.  Since the antibiotic she has gotten better bus he still has fatigued.  She feels drained.  After waking up in the morning she'll get up and eat breakfast then she has to lie flat again for a while.  She is still coughing up green mucus but it is much better than before.  She doesn't have a fever any more, her appetite is better, she is walking some now, and she is not quite as fatigued.    She has never been told she had a lung problem.  Childhood was normal, no respiratory illnesses.  Her father was a smoker, she grew up with a wood stove.  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    Never smoker.    She had fatigue before the pneumonia, but never really had any sort of respiratory problem.     Record review: October 2023 note with Dr. Gena Fray, her primary care physician reviewed.  She was noted to have pneumonia of the right middle lobe and was treated with Augmentin. New consult visit October 2023 with Dr. Benjamine Mola in the setting of joint pains and a positive ANA.  He says that there was no evidence of active skin disease  despite her diagnosis of discoid lupus (historical diagnosis).  He diagnosed her with primary osteoarthritis of hands and knees.  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 2014: Fracture fibula 2017: -1.8 2019: -1.4 2022: L1 compression fracture   Positive ANA (antinuclear antibody) 10/17/2021   09-2021: 1:40, speckled pattern   Primary open angle glaucoma of both eyes, moderate stage 12/12/2018     Family History  Problem Relation Age of Onset   Arthritis Mother    Arthritis Father    Leukemia Father    Liver disease Brother    Diabetes Brother  Breast cancer Paternal Aunt    Breast cancer Daughter      Social History   Socioeconomic History   Marital status: Married    Spouse name: Not on file   Number of children: Not on file   Years of education: Not on file   Highest education level: Not on file  Occupational History   Not on file  Tobacco Use   Smoking status: Never    Passive exposure: Past   Smokeless tobacco: Never  Vaping Use   Vaping Use: Never used  Substance and Sexual Activity   Alcohol use: Not Currently   Drug use: Never   Sexual activity: Yes  Other Topics Concern   Not on file  Social History Narrative    Not on file   Social Determinants of Health   Financial Resource Strain: Low Risk  (02/01/2022)   Overall Financial Resource Strain (CARDIA)    Difficulty of Paying Living Expenses: Not hard at all  Food Insecurity: No Food Insecurity (02/01/2022)   Hunger Vital Sign    Worried About Running Out of Food in the Last Year: Never true    [place] in the Last Year: Never true  Transportation Needs: No Transportation Needs (02/01/2022)   PRAPARE - Hydrologist (Medical): No    Lack of Transportation (Non-Medical): No  Physical Activity: Sufficiently Active (02/01/2022)   Exercise Vital Sign    Days of Exercise per Week: 5 days    Minutes of Exercise per Session: 30 min  Stress: No Stress Concern Present (02/01/2022)   [place]    Feeling of Stress : Not at all  Social Connections: [place] (02/01/2022)   Social Connection and Isolation Panel [NHANES]    Frequency of Communication with Friends and Family: Twice a week    Frequency of Social Gatherings with Friends and Family: Twice a week    Attends Religious Services: More than 4 times per year    Active Member of [employer] or Organizations: Yes    Attends Archivist Meetings: 1 to 4 times per year    Marital Status: Married  Human resources officer Violence: Not At Risk (02/01/2022)   Humiliation, Afraid, Rape, and Kick questionnaire    Fear of Current or Ex-Partner: No    Emotionally Abused: No    Physically Abused: No    Sexually Abused: No     Allergies  Allergen Reactions   Macrobid [Nitrofurantoin] Rash   Boniva [Ibandronic Acid] Other (See Comments)    Muscle aches /headache   Gabapentin     Dizzy and fell when taking     Prednisone     Makes me feel weird    Tizanidine     Low bp   Tramadol Itching   Ciprofloxacin     weird feeling    Codeine Itching and Rash   Plaquenil [Hydroxychloroquine] Rash   Sulfa  Antibiotics Itching and Rash     Outpatient Medications Prior to Visit  Medication Sig Dispense Refill   Alpha Lipoic Acid 200 MG CAPS Take 200 mg by mouth daily.     Ascorbic Acid (VITAMIN C) 100 MG tablet Take 100 mg by mouth daily.     b complex vitamins capsule Take 1 capsule by mouth daily.     Calcium-Phosphorus-Vitamin D (CITRACAL +D3 PO) Take 1 capsule by mouth daily.     diclofenac (VOLTAREN) 75 MG EC tablet  Take 75 mg by mouth 2 (two) times daily. Take one tablet by mouth. Twice a day with food as needed for pain/inflammation.     latanoprost (XALATAN) 0.005 % ophthalmic solution Place 1 drop into both eyes at bedtime.     meloxicam (MOBIC) 15 MG tablet      Multiple Vitamin (MULTIVITAMIN ADULT PO) Take 1 tablet by mouth daily.     Turmeric Curcumin 500 MG CAPS Take 1 capsule by mouth daily.     No facility-administered medications prior to visit.    Review of Systems  Constitutional:  Positive for malaise/fatigue. Negative for chills, fever and weight loss.  HENT:  Negative for congestion, nosebleeds, sinus pain and sore throat.   Eyes:  Negative for photophobia, pain and discharge.  Respiratory:  Positive for cough, sputum production and shortness of breath. Negative for hemoptysis and wheezing.   Cardiovascular:  Negative for chest pain, palpitations, orthopnea and leg swelling.  Gastrointestinal:  Negative for abdominal pain, constipation, diarrhea, nausea and vomiting.  Genitourinary:  Negative for dysuria, frequency, hematuria and urgency.  Musculoskeletal:  Negative for back pain, joint pain, myalgias and neck pain.  Skin:  Negative for itching and rash.  Neurological:  Negative for tingling, tremors, sensory change, speech change, focal weakness, seizures, weakness and headaches.  Psychiatric/Behavioral:  Negative for memory loss, substance abuse and suicidal ideas. The patient is not nervous/anxious.       Objective:  Physical Exam   Vitals:   08/31/22 1027   BP: 124/68  Pulse: 83  Temp: 98.1 F (36.7 C)  TempSrc: Oral  SpO2: 97%  Weight: 152 lb 12.8 oz (69.3 kg)  Height: '5\' 3"'$  (1.6 m)    Gen: well appearing, no acute distress HENT: NCAT, OP clear, neck supple without masses Eyes: PERRL, EOMi Lymph: no cervical lymphadenopathy PULM: Crackles bases bilaterally B CV: RRR, no mgr, no JVD GI: BS+, soft, nontender, no hsm Derm: no rash or skin breakdown, trace ankle edema MSK: normal bulk and tone Neuro: A&Ox4, CN II-XII intact, strength 5/5 in all 4 extremities Psyche: normal mood and affect   CBC    Component Value Date/Time   WBC 12.0 (H) 07/31/2022 1427   RBC 3.73 (L) 07/31/2022 1427   HGB 11.2 (L) 07/31/2022 1427   HCT 33.6 (L) 07/31/2022 1427   PLT 458.0 (H) 07/31/2022 1427   MCV 90.0 07/31/2022 1427   MCH 29.8 06/02/2021 1702   MCHC 33.5 07/31/2022 1427   RDW 13.6 07/31/2022 1427   LYMPHSABS 2.5 06/02/2021 1702   MONOABS 1.0 06/02/2021 1702   EOSABS 0.3 06/02/2021 1702   BASOSABS 0.1 06/02/2021 1702     Chest imaging: August 30, 2022 chest x-ray 2 view shows significant interstitial changes bilaterally and right middle lobe consolidation  PFT:  Labs:  Path:  Echo:  Heart Catheterization:       Assessment & Plan:   Community acquired pneumonia of right middle lobe of lung  Abnormal CXR  Discoid lupus  Fatigue, unspecified type  Discussion: Shelby Cardenas has a history consistent with a diagnosis of community acquired pneumonia and her ongoing fatigue, mild dyspnea, and improving mucus production are consistent with the natural history of that illness.  However, her chest x-ray shows interstitial change bilaterally and she has crackles in her lungs.  I wonder about the possibility of an underlying interstitial lung disease given her history of discoid lupus.  Plan: Community-acquired pneumonia: As we discussed today, this should continue to improve though it is normal  for you to still have some fatigue  and mucus production over the next few weeks. Drink plenty of fluids Eat unprocessed foods Gradually increase exercise Let me know if you have worsening chest congestion, mucus production, fever, or shortness of breath Check walking oximetry today  Abnormal chest x-ray: Most of the changes seen on the chest x-ray yesterday are due to the pneumonia, however because there was an abnormal interstitial pattern I want to get a high-resolution CT scan of your chest. We will discuss that further once the CT scan results are back  Follow-up with me in 3 to 4 weeks to go over the results of the high-resolution CT scan of the chest.  Immunizations: Immunization History  Administered Date(s) Administered   Fluad Quad(high Dose 65+) 09/13/2021   Influenza, High Dose Seasonal PF 09/05/2013, 08/17/2016, 08/13/2017   PFIZER(Purple Top)SARS-COV-2 Vaccination 11/15/2019, 12/06/2019   Pneumococcal Conjugate-13 05/05/2015   Pneumococcal Polysaccharide-23 03/05/2007   Td 01/13/2022   Td,absorbed, Preservative Free, Adult Use, Lf Unspecified 10/30/1996   Tdap 03/05/2007   Zoster Recombinat (Shingrix) 12/07/2021, 04/12/2022   Zoster, Live 10/20/2009     Current Outpatient Medications:    Alpha Lipoic Acid 200 MG CAPS, Take 200 mg by mouth daily., Disp: , Rfl:    Ascorbic Acid (VITAMIN C) 100 MG tablet, Take 100 mg by mouth daily., Disp: , Rfl:    b complex vitamins capsule, Take 1 capsule by mouth daily., Disp: , Rfl:    Calcium-Phosphorus-Vitamin D (CITRACAL +D3 PO), Take 1 capsule by mouth daily., Disp: , Rfl:    diclofenac (VOLTAREN) 75 MG EC tablet, Take 75 mg by mouth 2 (two) times daily. Take one tablet by mouth. Twice a day with food as needed for pain/inflammation., Disp: , Rfl:    latanoprost (XALATAN) 0.005 % ophthalmic solution, Place 1 drop into both eyes at bedtime., Disp: , Rfl:    meloxicam (MOBIC) 15 MG tablet, , Disp: , Rfl:    Multiple Vitamin (MULTIVITAMIN ADULT PO), Take 1 tablet  by mouth daily., Disp: , Rfl:    Turmeric Curcumin 500 MG CAPS, Take 1 capsule by mouth daily., Disp: , Rfl:

## 2022-09-07 ENCOUNTER — Ambulatory Visit: Payer: Medicare PPO | Admitting: Family Medicine

## 2022-09-12 ENCOUNTER — Ambulatory Visit: Payer: Medicare PPO | Admitting: Internal Medicine

## 2022-09-20 ENCOUNTER — Ambulatory Visit: Payer: Medicare PPO | Admitting: Pulmonary Disease

## 2022-09-27 ENCOUNTER — Ambulatory Visit: Payer: Medicare PPO | Admitting: Family Medicine

## 2022-09-28 ENCOUNTER — Ambulatory Visit
Admission: RE | Admit: 2022-09-28 | Discharge: 2022-09-28 | Disposition: A | Discharge status: Home / Self Care | Payer: Medicare PPO | Source: Ambulatory Visit | Attending: Pulmonary Disease | Admitting: Pulmonary Disease

## 2022-09-28 DIAGNOSIS — I700 Atherosclerosis of aorta: Secondary | ICD-10-CM | POA: Diagnosis not present

## 2022-09-28 DIAGNOSIS — R918 Other nonspecific abnormal finding of lung field: Secondary | ICD-10-CM | POA: Diagnosis not present

## 2022-09-28 DIAGNOSIS — J189 Pneumonia, unspecified organism: Secondary | ICD-10-CM

## 2022-09-28 DIAGNOSIS — J984 Other disorders of lung: Secondary | ICD-10-CM | POA: Diagnosis not present

## 2022-09-28 DIAGNOSIS — R9389 Abnormal findings on diagnostic imaging of other specified body structures: Secondary | ICD-10-CM

## 2022-09-28 DIAGNOSIS — J8410 Pulmonary fibrosis, unspecified: Secondary | ICD-10-CM | POA: Diagnosis not present

## 2022-10-04 ENCOUNTER — Other Ambulatory Visit: Payer: Self-pay

## 2022-10-04 ENCOUNTER — Encounter: Payer: Self-pay | Admitting: Pulmonary Disease

## 2022-10-04 ENCOUNTER — Ambulatory Visit: Payer: Medicare PPO | Admitting: Pulmonary Disease

## 2022-10-04 VITALS — BP 110/80 | HR 84 | Temp 98.1°F | Ht 64.0 in | Wt 161.2 lb

## 2022-10-04 DIAGNOSIS — J849 Interstitial pulmonary disease, unspecified: Secondary | ICD-10-CM | POA: Diagnosis not present

## 2022-10-04 DIAGNOSIS — L930 Discoid lupus erythematosus: Secondary | ICD-10-CM

## 2022-10-04 DIAGNOSIS — R9389 Abnormal findings on diagnostic imaging of other specified body structures: Secondary | ICD-10-CM | POA: Diagnosis not present

## 2022-10-04 DIAGNOSIS — R5383 Other fatigue: Secondary | ICD-10-CM | POA: Diagnosis not present

## 2022-10-04 LAB — SEDIMENTATION RATE: Sed Rate: 12 mm/hr (ref 0–30)

## 2022-10-04 NOTE — Patient Instructions (Signed)
Diffuse parenchymal lung disease with shortness of breath and cough: Full pulmonary function testing ILD lab panel today We will arrange for a bronchoscopy at [facility], date to be determined.  We will call you with the date.  We will see you back in 4 to 6 weeks or sooner if needed.

## 2022-10-04 NOTE — Progress Notes (Signed)
Synopsis: Referred in November 2023 for cough, pneumonia after COVID.  Subjective:   PATIENT ID: Shelby Cardenas GENDER: female DOB: [birth year], MRN: [id number]   HPI  Chief Complaint  Patient presents with  . Follow-up    Ct review, feeling winded   Shelby Cardenas says that her dyspnea is about the same.  It went away for about 2 days, but then came back.  She isn't coughing up a lot now.  No energy. No sick contacts. She says that she's had a few choking episodes over the years. This hasn't been a recurring problems.  Past Medical History:  Diagnosis Date  . Adenomatous colon polyp 04/11/2016  . Allergic rhinitis 02/06/2016  . Closed wedge compression fracture of first lumbar vertebra (Shelby Cardenas) 06/09/2021  . Cystocele with prolapse 07/02/2018  . Diaphragmatic hernia 02/06/2016  . GERD (gastroesophageal reflux disease)   . 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 Incontinence of feces 10/12/2021  . 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 Lupus (Shelby Cardenas)   . Mammogram abnormal 07/16/2018   Formatting of this note might be different from the original. 2019: right asymmetry  . Mixed hyperlipidemia 02/06/2016  . OAB (overactive bladder) 11/12/2020  . Osteoarthritis of hands, bilateral 10/12/2021  . Osteopenia 02/06/2016   Formatting of this note might be different from the original. 2014: Fracture fibula 2017: -1.8 2019: -1.4 2022: L1 compression fracture  . Positive ANA (antinuclear antibody) 10/17/2021   29-1916: 1:40, speckled pattern  . Primary open angle glaucoma of both eyes, moderate stage 12/12/2018    Review of Systems  Constitutional:  Negative for chills, fever, malaise/fatigue and weight loss.  HENT:  Negative for congestion, sinus pain and sore throat.   Respiratory:  Positive for cough, sputum production and shortness of breath.   Cardiovascular:  Negative for chest pain and leg swelling.     Objective:  Physical Exam   Vitals:    10/04/22 1309  BP: 110/80  Pulse: 84  Temp: 98.1 F (36.7 C)  TempSrc: Oral  SpO2: 98%  Weight: 161 lb 3.2 oz (73.1 kg)  Height: _0  (1.626 m)    Gen: well appearing HENT: OP clear, neck supple PULM: Crackles bases B, normal effort  CV: RRR, no mgr GI: BS+, soft, nontender Derm: no cyanosis or rash Psyche: normal mood and affect    CBC    Component Value Date/Time   WBC 12.0 (H) 07/31/2022 1427   RBC 3.73 (L) 07/31/2022 1427   HGB 11.2 (L) 07/31/2022 1427   HCT 33.6 (L) 07/31/2022 1427   PLT 458.0 (H) 07/31/2022 1427   MCV 90.0 07/31/2022 1427   MCH 29.8 06/02/2021 1702   MCHC 33.5 07/31/2022 1427   RDW 13.6 07/31/2022 1427   LYMPHSABS 2.5 06/02/2021 1702   MONOABS 1.0 06/02/2021 1702   EOSABS 0.3 06/02/2021 1702   BASOSABS 0.1 06/02/2021 1702     Chest imaging: August 30, 2022 chest x-ray 2 view shows significant interstitial changes bilaterally and right middle lobe consolidation November 2023 high-resolution CT scan of the chest images independently reviewed showing significant groundglass opacification, nodularity and mild traction bronchiectasis more predominant in the bases of both lungs, nonspecific interstitial, fibrotic change not consistent with UIP, upper lobes relatively spared, dense scarring RML, calcified lymph nodes noted, 0.7 cm pulmonary nodule in left upper lobe  PFT:  Labs:  Path:  Echo:  Heart Catheterization:       Assessment & Plan:   ILD (interstitial lung  disease) (Shelby Cardenas)  Fatigue, Cardenas type  Abnormal CXR  Discoid lupus  Discussion: Shelby Cardenas has continued dyspnea and ongoing mucus production with a fairly unusual CT scan of the chest showing an interstitial lung disease process of undetermined etiology.  Calcified lymph nodes, focal bronchiectasis and groundglass highlighted the abnormalities.  Differential diagnosis is broad and includes postinfectious inflammatory scarring, less likely chronic hypersensitivity  pneumonitis or sarcoidosis.  In her particular case it is not clear to me at all what is causing this.  She does have a prior diagnosis of lupus though rheumatology called that diagnosis into question.  I think the best approach moving forward is to try to better understand the underlying etiology of her diffuse parenchymal lung disease with a bronchoscopy during which time we would perform transbronchial biopsies, BAL with cell count and differential and fine-needle aspiration of her lymphadenopathy.  Plan: Diffuse parenchymal lung disease with shortness of breath and cough: Full pulmonary function testing ILD lab panel today We will arrange for a bronchoscopy at [facility], date to be determined.  We will call you with the date.  We will see you back in 4 to 6 weeks or sooner if needed.  Immunizations: Immunization History  Administered Date(s) Administered  . Fluad Quad(high Dose 65+) 09/13/2021  . Influenza, High Dose Seasonal PF 09/05/2013, 08/17/2016, 08/13/2017  . PFIZER(Purple Top)SARS-COV-2 Vaccination 11/15/2019, 12/06/2019  . Pneumococcal Conjugate-13 05/05/2015  . Pneumococcal Polysaccharide-23 03/05/2007  . Shelby Cardenas 01/13/2022  . Shelby Cardenas 10/30/1996  . Tdap 03/05/2007  . Zoster Recombinat (Shingrix) 12/07/2021, 04/12/2022  . Zoster, Live 10/20/2009     Current Outpatient Medications:  .  Alpha Lipoic Acid 200 MG CAPS, Take 200 mg by mouth daily., Disp: , Rfl:  .  Ascorbic Acid (VITAMIN C) 100 MG tablet, Take 100 mg by mouth daily., Disp: , Rfl:  .  b complex vitamins capsule, Take 1 capsule by mouth daily., Disp: , Rfl:  .  Calcium-Phosphorus-Vitamin D (CITRACAL +D3 PO), Take 1 capsule by mouth daily., Disp: , Rfl:  .  diclofenac (Shelby Cardenas) 75 MG EC tablet, Take 75 mg by mouth 2 (two) times daily. Take one tablet by mouth. Twice a day with food as needed for pain/inflammation., Disp: , Rfl:  .  latanoprost (Shelby Cardenas)  0.005 % ophthalmic solution, Place 1 drop into both eyes at bedtime., Disp: , Rfl:  .  Multiple Vitamin (MULTIVITAMIN ADULT PO), Take 1 tablet by mouth daily., Disp: , Rfl:  .  Turmeric Curcumin 500 MG CAPS, Take 1 capsule by mouth daily., Disp: , Rfl:  .  meloxicam (MOBIC) 15 MG tablet, , Disp: , Rfl:

## 2022-10-05 DIAGNOSIS — D225 Melanocytic nevi of trunk: Secondary | ICD-10-CM | POA: Diagnosis not present

## 2022-10-05 DIAGNOSIS — L570 Actinic keratosis: Secondary | ICD-10-CM | POA: Diagnosis not present

## 2022-10-05 DIAGNOSIS — D2271 Melanocytic nevi of right lower limb, including hip: Secondary | ICD-10-CM | POA: Diagnosis not present

## 2022-10-05 DIAGNOSIS — L820 Inflamed seborrheic keratosis: Secondary | ICD-10-CM | POA: Diagnosis not present

## 2022-10-05 DIAGNOSIS — L578 Other skin changes due to chronic exposure to nonionizing radiation: Secondary | ICD-10-CM | POA: Diagnosis not present

## 2022-10-05 DIAGNOSIS — L821 Other seborrheic keratosis: Secondary | ICD-10-CM | POA: Diagnosis not present

## 2022-10-05 DIAGNOSIS — Z85828 Personal history of other malignant neoplasm of skin: Secondary | ICD-10-CM | POA: Diagnosis not present

## 2022-10-05 DIAGNOSIS — Z808 Family history of malignant neoplasm of other organs or systems: Secondary | ICD-10-CM | POA: Diagnosis not present

## 2022-10-05 DIAGNOSIS — D485 Neoplasm of uncertain behavior of skin: Secondary | ICD-10-CM | POA: Diagnosis not present

## 2022-10-05 HISTORY — PX: SKIN BIOPSY: SHX1

## 2022-10-05 LAB — ANTI-SMITH ANTIBODY: ENA SM Ab Ser-aCnc: <1 AI

## 2022-10-06 ENCOUNTER — Telehealth: Payer: Self-pay | Admitting: Pulmonary Disease

## 2022-10-06 ENCOUNTER — Encounter: Payer: Self-pay | Admitting: Pulmonary Disease

## 2022-10-06 DIAGNOSIS — R9389 Abnormal findings on diagnostic imaging of other specified body structures: Secondary | ICD-10-CM

## 2022-10-06 LAB — ANCA PROFILE
Anti-MPO Antibodies: <0.2 units (ref 0.0–0.9)
Anti-PR3 Antibodies: <0.2 units (ref 0.0–0.9)
Atypical pANCA: <1:20 {titer}
C-ANCA: <1:20 {titer}
P-ANCA: <1:20 {titer}

## 2022-10-06 LAB — CK TOTAL AND CKMB (NOT AT ARMC)
CK, MB: 1.5 ng/mL (ref 0–5.0)
Relative Index: 2.3 (ref 0–4.0)
Total CK: 64 U/L (ref 29–143)

## 2022-10-06 LAB — ALDOLASE: Aldolase: 4.2 U/L (ref <=8.1)

## 2022-10-06 NOTE — Telephone Encounter (Signed)
Taken care of and printed to be scheduled in 03/2023

## 2022-10-06 NOTE — Telephone Encounter (Signed)
[facility]'s- this ct was ordered wrong. She needed a 6 month ct without. Please cancel the HRCT 10/27/22.  I called and left detailed msg letting pt's daughter know that this will be taken care of

## 2022-10-09 LAB — RNP ANTIBODY: Ribonucleic Protein(ENA) Antibody, IgG: <1 AI

## 2022-10-09 LAB — ANTI-NUCLEAR AB-TITER (ANA TITER)
ANA TITER: 1:80 {titer} — ABNORMAL HIGH
ANA Titer 1: 1:80 {titer} — ABNORMAL HIGH

## 2022-10-09 LAB — ANCA SCREEN W REFLEX TITER: ANCA SCREEN: NEGATIVE

## 2022-10-09 LAB — SJOGREN'S SYNDROME ANTIBODS(SSA + SSB)
SSA (Ro) (ENA) Antibody, IgG: <1 AI
SSB (La) (ENA) Antibody, IgG: <1 AI

## 2022-10-09 LAB — ANTI-DNA ANTIBODY, DOUBLE-STRANDED: ds DNA Ab: <1 IU/mL

## 2022-10-09 LAB — ANTI-SCLERODERMA ANTIBODY: Scleroderma (Scl-70) (ENA) Antibody, IgG: <1 AI

## 2022-10-09 LAB — ANA: Anti Nuclear Antibody (ANA): POSITIVE — AB

## 2022-10-09 LAB — CYCLIC CITRUL PEPTIDE ANTIBODY, IGG: Cyclic Citrullin Peptide Ab: <16 UNITS

## 2022-10-09 LAB — JO-1 ANTIBODY-IGG: Jo-1 Autoabs: <1 AI

## 2022-10-11 ENCOUNTER — Ambulatory Visit: Payer: Medicare PPO | Attending: Internal Medicine | Admitting: Internal Medicine

## 2022-10-11 ENCOUNTER — Encounter: Payer: Self-pay | Admitting: Internal Medicine

## 2022-10-11 VITALS — BP 125/76 | HR 94 | Resp 14 | Ht 63.0 in | Wt 163.0 lb

## 2022-10-11 DIAGNOSIS — M170 Bilateral primary osteoarthritis of knee: Secondary | ICD-10-CM

## 2022-10-11 DIAGNOSIS — M329 Systemic lupus erythematosus, unspecified: Secondary | ICD-10-CM

## 2022-10-11 DIAGNOSIS — M1712 Unilateral primary osteoarthritis, left knee: Secondary | ICD-10-CM

## 2022-10-11 DIAGNOSIS — R768 Other specified abnormal immunological findings in serum: Secondary | ICD-10-CM

## 2022-10-11 DIAGNOSIS — Z872 Personal history of diseases of the skin and subcutaneous tissue: Secondary | ICD-10-CM | POA: Diagnosis not present

## 2022-10-11 DIAGNOSIS — M1711 Unilateral primary osteoarthritis, right knee: Secondary | ICD-10-CM | POA: Diagnosis not present

## 2022-10-11 NOTE — Progress Notes (Signed)
Office Visit Note  Patient: Shelby Cardenas             Date of Birth: [birth year]           MRN: [id number]             PCP: Haydee Salter, MD Referring: Haydee Salter, MD Visit Date: 10/11/2022   Subjective:   History of Present Illness: Shelby Cardenas is a 81 y.o. female here for follow up for joint and muscle pain and fatigue with positive ANA. At our initial visit in October she was negative for lupus specific antibodies but had elevated ESR and CRP. At the time was also finishing treatment for pneumonia. Repeat labs were checked earlier this month with normal inflammatory markers, low positive ANA persists, otherwise negative labs. 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   Previous HPI 08/09/22 Shelby Cardenas is a 81 y.o. female here for evaluation of fatigue and muscle and joint pains with positive ANA. Additional inflammatory markers including ESR, CRP, and CK were unremarkable.  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  She reports previously identifying an ionized water supplement since about 12 years ago that greatly improved her inflammation and skin disease.  She has had some degree of chronic joint pain intermittently but has not been severely limiting her physical activities until within about the past 2 years.  Symptoms markedly increased after August of last year when she sustained a L1 compression fracture while emptying a dehumidifier.  Back pain has improved but also experience a generalized worsening with deconditioning and increasing of hip and knee pains and less activity tolerance. She was prescribed diclofenac and meloxicam for osteoarthritis joint pains.  She also takes ibuprofen as needed but does not like taking extra medications except when completely  necessary.  She is taking a turmeric supplement that is helpful.  She was prescribed Boniva for osteoporosis based on fragility fracture but did not tolerate medication and avoided starting any alternative due to concern of fatigue side effect. She has worked with physical therapy particularly for her left knee and hip weakness and pain but still has symptoms from this often bothering her at night or catching or instability at the knee while walking. She recently got sick with [id number] complicated by pneumonia about 3 weeks of total symptoms now and is on Augmentin for this. She has severely reduced exertion intolerance and ongoing productive coughing since this started. She denies oral or nasal ulcers, lymphadenopathy, skin rashes, or raynaud's symptoms. No visible joint swelling or erythema. Labs from 09/2021 reviewed with low positive ANA 1:40 cytoplasmic but normal inflammatory markers. She has been negative for TSH, B12, or vitamin D abnormality.   Review of Systems  Constitutional:  Positive for fatigue.  HENT:  Negative for mouth sores and mouth dryness.   Eyes:  Positive for dryness.  Respiratory:  Positive for shortness of breath.   Cardiovascular:  Negative for chest pain and palpitations.  Gastrointestinal:  Negative for blood in stool, constipation and diarrhea.  Endocrine: Negative for increased urination.  Genitourinary:  Negative for involuntary urination.  Musculoskeletal:  Positive for joint pain, gait problem, joint pain, joint swelling, myalgias, muscle weakness, morning stiffness, muscle tenderness and myalgias.  Skin:  Positive for hair loss and sensitivity to sunlight. Negative for color change  and rash.  Allergic/Immunologic: Negative for susceptible to infections.  Neurological:  Negative for dizziness and headaches.  Hematological:  Negative for swollen glands.  Psychiatric/Behavioral:  Negative for depressed mood and sleep disturbance. The patient is not nervous/anxious.      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/06/2021   Closed wedge compression fracture of first lumbar vertebra ([place]) 06/09/2021   OAB (overactive bladder) 11/12/2020   Primary open angle glaucoma of both eyes, moderate stage 12/12/2018   Mammogram abnormal 07/16/2018   Cystocele with prolapse 07/02/2018   Adenomatous colon polyp 04/11/2016   Mixed hyperlipidemia 02/06/2016   Osteopenia 02/06/2016   Diaphragmatic hernia 02/06/2016   Allergic rhinitis 02/06/2016    Past Medical History:  Diagnosis Date   Adenomatous colon polyp 04/11/2016   Allergic rhinitis 02/06/2016   Closed wedge compression fracture of first lumbar vertebra ([place]) 06/09/2021   Cystocele with prolapse 07/02/2018   Diaphragmatic hernia 02/06/2016   GERD (gastroesophageal reflux disease)    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   Incontinence of feces 10/12/2021   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 2014: Fracture fibula 2017: -1.8 2019: -1.4 2022: L1 compression fracture   Positive ANA (antinuclear antibody) 10/17/2021   09-2021: 1:40, speckled pattern   Primary open angle  glaucoma of both eyes, moderate stage 12/12/2018    Family History  Problem Relation Age of Onset   Arthritis Mother    Arthritis Father    Leukemia Father    Liver disease Brother    Diabetes Brother    Breast cancer Paternal Aunt    Breast cancer Daughter    Past Surgical History:  Procedure Laterality Date   CESAREAN SECTION     CHOLECYSTECTOMY     KNEE SURGERY Left    NEUROPLASTY / TRANSPOSITION MEDIAN NERVE AT [place]  05/2021   Back   SKIN BIOPSY  10/05/2022   left upper arm   SKIN CANCER EXCISION     TONSILLECTOMY     Social History   Social History Narrative   Not on file   Immunization History  Administered Date(s) Administered   Fluad Quad(high Dose 65+) 09/13/2021   Influenza, High Dose Seasonal PF 09/05/2013, 08/17/2016, 08/13/2017   PFIZER(Purple Top)SARS-COV-2 Vaccination 11/15/2019, 12/06/2019   Pneumococcal Conjugate-13 05/05/2015   Pneumococcal Polysaccharide-23 03/05/2007   Td 01/13/2022   Td,absorbed, Preservative Free, Adult Use, Lf Unspecified 10/30/1996   Tdap 03/05/2007   Zoster Recombinat (Shingrix) 12/07/2021, 04/12/2022   Zoster, Live 10/20/2009     Objective: Vital Signs: BP 125/76 (BP Location: Right Arm, Patient Position: Sitting, Cuff Size: Normal)   Pulse 94   Resp 14   Ht _0  (1.6 m)   Wt 163  lb (73.9 kg)   BMI 28.87 kg/m    Physical Exam Cardiovascular:     Rate and Rhythm: Normal rate and regular rhythm.  Pulmonary:     Effort: Pulmonary effort is normal.     Comments: Faint basilar inspiratory crackles Musculoskeletal:     Comments: Trace pedal edema  Skin:    General: Skin is warm and dry.  Neurological:     Mental Status: She is alert.  Psychiatric:        Mood and Affect: Mood normal.      Musculoskeletal Exam:  Shoulders full ROM no tenderness or swelling Elbows full ROM no tenderness or swelling Wrists full ROM no tenderness or swelling Heberdon's nodes throughout both hands, no synovitis Right  knee swelling with large mildly tender popliteal cyst, painful with extension ROM  Investigation: No additional findings.  Imaging: CT Chest High Resolution  Result Date: 09/28/2022 CLINICAL DATA:  Abnormal chest radiograph, follow-up right middle lobe pneumonia EXAM: CT CHEST WITHOUT CONTRAST TECHNIQUE: Multidetector CT imaging of the chest was performed following the standard protocol without intravenous contrast. High resolution imaging of the lungs, as well as inspiratory and expiratory imaging, was performed. RADIATION DOSE REDUCTION: This exam was performed according to the departmental dose-optimization program which includes automated exposure control, adjustment of the mA and/or kV according to patient size and/or use of iterative reconstruction technique. COMPARISON:  Chest radiographs, 08/30/2020 FINDINGS: Cardiovascular: Aortic atherosclerosis. Normal heart size. Left coronary artery calcifications. No pericardial effusion. Mediastinum/Nodes: No enlarged mediastinal, hilar, or axillary lymph nodes. Numerous prominent mediastinal and hilar lymph nodes, some of which are coarsely calcified (series 8, image 61). Demonstrate no significant findings. 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 Additional dense fibrotic scarring and consolidation of the medial segment right middle lobe (series 7, image 100). No significant air trapping on expiratory phase imaging. There is a 0.7 cm nodule of the medial left apex (series 7, image 23). No pleural effusion or pneumothorax. Upper Abdomen: No acute abnormality. Simple, benign renal cortical cyst of the superior pole of the right kidney, for which no further follow-up or characterization is required. Status post  cholecystectomy. Musculoskeletal: No chest wall abnormality. No acute osseous findings. IMPRESSION: 1. 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 [place], Iss 5, [phone], Jun 30 2017. 2. Additional dense fibrotic scarring and consolidation of the medial segment right middle lobe, most likely incidentally superimposed postinfectious or inflammatory scarring. 3. There is a 0.7 cm nodule of the medial left pulmonary apex. Non-contrast chest CT at 6-12 months is recommended. If the nodule is stable at time of repeat CT, then future CT at 18-24 months (from today's scan) is considered optional for low-risk patients, but is recommended for high-risk patients. This recommendation follows the consensus statement: Guidelines for Management of Incidental Pulmonary Nodules Detected on CT Images: From the Fleischner Society 2017; Radiology 2017; 284:228-243. 4. Numerous prominent mediastinal and hilar lymph nodes,  some of which are coarsely calcified, which may reflect prior granulomatous infection or nodal sarcoidosis. 5. Coronary artery disease. Aortic Atherosclerosis (ICD10-I70.0). Electronically Signed   By: Delanna Ahmadi M.D.   On: 09/28/2022 17:29    Recent Labs: Lab Results  Component Value Date   WBC 12.0 (H) 07/31/2022   HGB 11.2 (L) 07/31/2022   PLT 458.0 (H) 07/31/2022   NA 129 (L) 07/31/2022   K 4.4 07/31/2022   CL 94 (L) 07/31/2022   CO2 25 07/31/2022    GLUCOSE 99 07/31/2022   BUN 9 07/31/2022   CREATININE 0.65 07/31/2022   BILITOT 0.3 07/31/2022   ALKPHOS 91 07/31/2022   AST 33 07/31/2022   ALT 27 07/31/2022   PROT 7.3 07/31/2022   ALBUMIN 3.6 07/31/2022   CALCIUM 8.9 07/31/2022    Speciality Comments: No specialty comments available.  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  Further supported by a repeat laboratory testing was all negative or unremarkable.  Skin disease does not seem active at this time and so I do not recommend systemic medication treatment needed for lupus based on these findings  She is scheduled to have bronchoscopy to evaluate the ILD lung changes.  If the results are highly suggestive or concerning for sarcoidosis which could present with findings similar to a lupus profundus or panniculitis could follow-up as needed.  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  Right knee popliteal cyst big enough to be symptomatic is present today.  Discussed supportive measures such as local anti-inflammatory treatment, injections, bracing but if symptoms are worsening may benefit more with orthopedic clinic follow-up.  Orders: No orders of the defined types were placed in this encounter.  No orders of the defined types were placed in this encounter.    Follow-Up Instructions: Return if symptoms worsen or fail to improve.   Collier Salina, MD  Note - This record has been  created using [employer].  Chart creation errors have been sought, but may not always  have been located. Such creation errors do not reflect on  the standard of medical care.

## 2022-10-12 LAB — HYPERSENSITIVITY PNEUMONITIS
A. Pullulans Abs: NEGATIVE
A.Fumigatus #1 Abs: NEGATIVE
Micropolyspora faeni, IgG: NEGATIVE
Pigeon Serum Abs: NEGATIVE
Thermoact. Saccharii: NEGATIVE
Thermoactinomyces vulgaris, IgG: NEGATIVE

## 2022-10-13 DIAGNOSIS — M170 Bilateral primary osteoarthritis of knee: Secondary | ICD-10-CM | POA: Diagnosis not present

## 2022-10-13 DIAGNOSIS — M1711 Unilateral primary osteoarthritis, right knee: Secondary | ICD-10-CM | POA: Diagnosis not present

## 2022-10-13 DIAGNOSIS — M1712 Unilateral primary osteoarthritis, left knee: Secondary | ICD-10-CM | POA: Diagnosis not present

## 2022-10-16 DIAGNOSIS — Z83511 Family history of glaucoma: Secondary | ICD-10-CM | POA: Diagnosis not present

## 2022-10-16 DIAGNOSIS — H43813 Vitreous degeneration, bilateral: Secondary | ICD-10-CM | POA: Diagnosis not present

## 2022-10-16 DIAGNOSIS — H52203 Unspecified astigmatism, bilateral: Secondary | ICD-10-CM | POA: Diagnosis not present

## 2022-10-16 DIAGNOSIS — H401132 Primary open-angle glaucoma, bilateral, moderate stage: Secondary | ICD-10-CM | POA: Diagnosis not present

## 2022-10-16 DIAGNOSIS — Z961 Presence of intraocular lens: Secondary | ICD-10-CM | POA: Diagnosis not present

## 2022-10-16 DIAGNOSIS — H26491 Other secondary cataract, right eye: Secondary | ICD-10-CM | POA: Diagnosis not present

## 2022-10-16 DIAGNOSIS — H0100A Unspecified blepharitis right eye, upper and lower eyelids: Secondary | ICD-10-CM | POA: Diagnosis not present

## 2022-10-16 DIAGNOSIS — H0100B Unspecified blepharitis left eye, upper and lower eyelids: Secondary | ICD-10-CM | POA: Diagnosis not present

## 2022-10-16 DIAGNOSIS — H04123 Dry eye syndrome of bilateral lacrimal glands: Secondary | ICD-10-CM | POA: Diagnosis not present

## 2022-10-19 LAB — MYOMARKER 3 PLUS PROFILE (RDL)
Anti-EJ Ab (RDL): NEGATIVE
Anti-Jo-1 Ab (RDL): <20 Units (ref <20)
Anti-Ku Ab (RDL): NEGATIVE
Anti-MDA-5 Ab (CADM-140)(RDL): <20 Units (ref <20)
Anti-Mi-2 Ab (RDL): NEGATIVE
Anti-NXP-2 (P140) Ab (RDL): <20 Units (ref <20)
Anti-OJ Ab (RDL): NEGATIVE
Anti-PL-12 Ab (RDL: NEGATIVE
Anti-PL-7 Ab (RDL): NEGATIVE
Anti-PM/Scl-100 Ab (RDL): <20 Units (ref <20)
Anti-SAE1 Ab, IgG (RDL): <20 Units (ref <20)
Anti-SRP Ab (RDL): NEGATIVE
Anti-SS-A 52kD Ab, IgG (RDL): 66 Units — ABNORMAL HIGH (ref <20)
Anti-TIF-1gamma Ab (RDL): <20 Units (ref <20)
Anti-U1 RNP Ab (RDL): <20 Units (ref <20)
Anti-U2 RNP Ab (RDL): NEGATIVE
Anti-U3 RNP (Fibrillarin)(RDL): NEGATIVE

## 2022-10-24 ENCOUNTER — Other Ambulatory Visit: Payer: Medicare PPO

## 2022-10-24 ENCOUNTER — Other Ambulatory Visit: Payer: Self-pay | Admitting: *Deleted

## 2022-10-24 DIAGNOSIS — Z01818 Encounter for other preprocedural examination: Secondary | ICD-10-CM | POA: Diagnosis not present

## 2022-10-25 ENCOUNTER — Encounter (HOSPITAL_COMMUNITY): Payer: Self-pay | Admitting: Pulmonary Disease

## 2022-10-25 ENCOUNTER — Other Ambulatory Visit: Payer: Self-pay

## 2022-10-25 NOTE — Progress Notes (Signed)
PCP - Dr. Gena Fray  Cardiologist - Dr. Bettina Gavia- has not seen on 2 years  EP- Denies  Endocrine- Denies  Pulm- Denies  Chest x-ray - 08/30/22 (E)  EKG - 06/07/22 (E)  Stress Test - Denies  ECHO - 06/15/22 (E)  Cardiac Cath - Denies   AICD-na PM-na LOOP-na  Nerve Stimulator- Denies  Dialysis- Denies  Sleep Study - Denies CPAP - Denies  LABS- 10/27/22: CBC  ASA- Denies  ERAS- No  HA1C- Denies  Anesthesia- No  Pt denies having chest pain, sob, or fever during the pre-op phone call. All instructions explained to the pt, with a verbal understanding of the material. Pt also instructed to wear a mask and social distance if she goes out. The opportunity to ask questions was provided.

## 2022-10-25 NOTE — Progress Notes (Signed)
S.D.W- Instructions   Your procedure is scheduled on Fri. Dec. 29, 2023 from 8:30AM-10:00AM.  Report to [facility] Main Entrance "A" at 6:00 A.M., then check in with the Admitting office.  Call this number if you have problems the morning of surgery:  [phone]             If you experience any cold or flu symptoms such as cough, fever, chills, shortness of breath, etc. between now and your scheduled surgery, please notify us at the above         number.  Masks are now required throughout our facilities due to the increasing cases of Covid, Flu, and RSV infections.   Remember:  Do not eat after midnight on Dec. 28th    Take these medicines the morning of surgery with A SIP OF WATER:  Eyedrops  As of today, STOP taking any Aspirin (unless otherwise instructed by your surgeon) Aleve, Naproxen, Ibuprofen, Motrin, Advil, Goody's, BC's, all herbal medications, fish oil, and all vitamins.          Do not wear jewelry or makeup. Do not wear lotions, powders, perfumes, or deodorant. Do not shave 48 hours prior to surgery.   Do not bring valuables to the hospital. Do not wear nail polish, gel polish, artificial nails, or any other type of covering on natural nails (fingers and toes) If you have artificial nails or gel coating that need to be removed by a nail salon, please have this removed prior to surgery. Artificial nails or gel coating may interfere with anesthesia's ability to adequately monitor your vital signs.  [facility] is not responsible for any belongings or valuables.    Do NOT Smoke (Tobacco/Vaping)  24 hours prior to your procedure  If you use a CPAP at night, you may bring your mask for your overnight stay.   Contacts, glasses, hearing aids, dentures or partials may not be worn into surgery, please bring cases for these belongings   For patients admitted to the hospital, discharge time will be determined by your treatment team.   Patients discharged the day of surgery  will not be allowed to drive home, and someone needs to stay with them for 24 hours.  Special instructions:    Oral Hygiene is also important to reduce your risk of infection.  Remember - BRUSH YOUR TEETH THE MORNING OF SURGERY WITH YOUR REGULAR TOOTHPASTE  [facility]- Preparing For Surgery  Before surgery, you can play an important role. Because skin is not sterile, your skin needs to be as free of germs as possible. You can reduce the number of germs on your skin by washing with Antibacterial Soap before surgery.     Please follow these instructions carefully.     Shower the NIGHT BEFORE SURGERY and the MORNING OF SURGERY with Antibacterial Soap.   Pat yourself dry with a CLEAN TOWEL.  Wear CLEAN PAJAMAS to bed the night before surgery  Place CLEAN SHEETS on your bed the night before your surgery  DO NOT SLEEP WITH PETS.  Day of Surgery:  Take a shower with Antibacterial soap. Wear Clean/Comfortable clothing the morning of surgery Do not apply any deodorants/lotions.   Remember to brush your teeth WITH YOUR REGULAR TOOTHPASTE.   If you test positive for Covid, or been in contact with anyone that has tested positive in the last 10 days, please notify your surgeon.  SURGICAL WAITING ROOM VISITATION Patients having surgery or a procedure may have no more than  2 support people in the waiting area - these visitors may rotate.   Children under the age of 66 must have an adult with them who is not the patient. If the patient needs to stay at the hospital during part of their recovery, the visitor guidelines for inpatient rooms apply. Pre-op nurse will coordinate an appropriate time for 1 support person to accompany patient in pre-op.  This support person may not rotate.   Please refer to the [facility] website for the visitor guidelines for Inpatients (after your surgery is over and you are in a regular room).

## 2022-10-27 ENCOUNTER — Encounter (HOSPITAL_COMMUNITY): Payer: Self-pay | Admitting: Pulmonary Disease

## 2022-10-27 ENCOUNTER — Ambulatory Visit (HOSPITAL_COMMUNITY): Payer: Medicare PPO | Admitting: Certified Registered"

## 2022-10-27 ENCOUNTER — Other Ambulatory Visit: Payer: Self-pay

## 2022-10-27 ENCOUNTER — Ambulatory Visit (HOSPITAL_COMMUNITY)
Admission: RE | Admit: 2022-10-27 | Discharge: 2022-10-27 | Disposition: A | Discharge status: Home / Self Care | Payer: Medicare PPO | Attending: Pulmonary Disease | Admitting: Pulmonary Disease

## 2022-10-27 ENCOUNTER — Ambulatory Visit (HOSPITAL_BASED_OUTPATIENT_CLINIC_OR_DEPARTMENT_OTHER): Payer: Medicare PPO | Admitting: Certified Registered"

## 2022-10-27 ENCOUNTER — Ambulatory Visit: Payer: Medicare PPO | Admitting: Internal Medicine

## 2022-10-27 ENCOUNTER — Encounter (HOSPITAL_COMMUNITY)
Admission: RE | Disposition: A | Discharge status: Home / Self Care | Payer: Self-pay | Source: Home / Self Care | Attending: Pulmonary Disease

## 2022-10-27 ENCOUNTER — Ambulatory Visit (HOSPITAL_COMMUNITY): Payer: Medicare PPO

## 2022-10-27 ENCOUNTER — Ambulatory Visit (HOSPITAL_BASED_OUTPATIENT_CLINIC_OR_DEPARTMENT_OTHER): Payer: Medicare PPO

## 2022-10-27 DIAGNOSIS — R918 Other nonspecific abnormal finding of lung field: Secondary | ICD-10-CM | POA: Diagnosis not present

## 2022-10-27 DIAGNOSIS — R059 Cough, unspecified: Secondary | ICD-10-CM | POA: Insufficient documentation

## 2022-10-27 DIAGNOSIS — M1990 Unspecified osteoarthritis, unspecified site: Secondary | ICD-10-CM

## 2022-10-27 DIAGNOSIS — Z8616 Personal history of COVID-19: Secondary | ICD-10-CM | POA: Insufficient documentation

## 2022-10-27 DIAGNOSIS — K219 Gastro-esophageal reflux disease without esophagitis: Secondary | ICD-10-CM | POA: Insufficient documentation

## 2022-10-27 DIAGNOSIS — J849 Interstitial pulmonary disease, unspecified: Secondary | ICD-10-CM | POA: Insufficient documentation

## 2022-10-27 DIAGNOSIS — R0600 Dyspnea, unspecified: Secondary | ICD-10-CM | POA: Insufficient documentation

## 2022-10-27 DIAGNOSIS — K449 Diaphragmatic hernia without obstruction or gangrene: Secondary | ICD-10-CM | POA: Insufficient documentation

## 2022-10-27 DIAGNOSIS — R590 Localized enlarged lymph nodes: Secondary | ICD-10-CM | POA: Insufficient documentation

## 2022-10-27 DIAGNOSIS — J9811 Atelectasis: Secondary | ICD-10-CM | POA: Diagnosis not present

## 2022-10-27 DIAGNOSIS — L930 Discoid lupus erythematosus: Secondary | ICD-10-CM | POA: Insufficient documentation

## 2022-10-27 HISTORY — PX: HEMOSTASIS CONTROL: SHX6838

## 2022-10-27 HISTORY — PX: BRONCHIAL WASHINGS: SHX5105

## 2022-10-27 HISTORY — PX: VIDEO BRONCHOSCOPY WITH ENDOBRONCHIAL ULTRASOUND: SHX6177

## 2022-10-27 HISTORY — PX: BRONCHIAL NEEDLE ASPIRATION BIOPSY: SHX5106

## 2022-10-27 HISTORY — PX: BRONCHIAL BIOPSY: SHX5109

## 2022-10-27 LAB — BODY FLUID CELL COUNT WITH DIFFERENTIAL
Eos, Fluid: 3 %
Lymphs, Fluid: 17 %
Monocyte-Macrophage-Serous Fluid: 41 % — ABNORMAL LOW (ref 50–90)
Neutrophil Count, Fluid: 39 % — ABNORMAL HIGH (ref 0–25)
Total Nucleated Cell Count, Fluid: 165 cu mm (ref 0–1000)

## 2022-10-27 LAB — CBC
HCT: 40 % (ref 36.0–46.0)
Hemoglobin: 13.5 g/dL (ref 12.0–15.0)
MCH: 30.8 pg (ref 26.0–34.0)
MCHC: 33.8 g/dL (ref 30.0–36.0)
MCV: 91.3 fL (ref 80.0–100.0)
Platelets: 257 10*3/uL (ref 150–400)
RBC: 4.38 MIL/uL (ref 3.87–5.11)
RDW: 13.2 % (ref 11.5–15.5)
WBC: 9 10*3/uL (ref 4.0–10.5)
nRBC: 0 % (ref 0.0–0.2)

## 2022-10-27 LAB — SARS CORONAVIRUS 2 BY RT PCR: SARS Coronavirus 2 by RT PCR: NEGATIVE

## 2022-10-27 SURGERY — BRONCHOSCOPY, WITH EBUS
Anesthesia: General

## 2022-10-27 MED ORDER — LIDOCAINE 2% (20 MG/ML) 5 ML SYRINGE
INTRAMUSCULAR | Status: DC | PRN
  Administered 2022-10-27: 50 mg via INTRAVENOUS

## 2022-10-27 MED ORDER — DEXAMETHASONE SODIUM PHOSPHATE 10 MG/ML IJ SOLN
INTRAMUSCULAR | Status: DC | PRN
  Administered 2022-10-27: 8 mg via INTRAVENOUS

## 2022-10-27 MED ORDER — ONDANSETRON HCL 4 MG/2ML IJ SOLN
INTRAMUSCULAR | Status: DC | PRN
  Administered 2022-10-27: 4 mg via INTRAVENOUS

## 2022-10-27 MED ORDER — EPHEDRINE SULFATE-NACL 50-0.9 MG/10ML-% IV SOSY
PREFILLED_SYRINGE | INTRAVENOUS | Status: DC | PRN
  Administered 2022-10-27: 5 mg via INTRAVENOUS
  Administered 2022-10-27: 2.5 mg via INTRAVENOUS

## 2022-10-27 MED ORDER — FENTANYL CITRATE (PF) 250 MCG/5ML IJ SOLN
INTRAMUSCULAR | Status: AC
  Filled 2022-10-27: qty 5

## 2022-10-27 MED ORDER — PHENYLEPHRINE 80 MCG/ML (10ML) SYRINGE FOR IV PUSH (FOR BLOOD PRESSURE SUPPORT)
PREFILLED_SYRINGE | INTRAVENOUS | Status: DC | PRN
  Administered 2022-10-27: 80 ug via INTRAVENOUS

## 2022-10-27 MED ORDER — SUGAMMADEX SODIUM 200 MG/2ML IV SOLN
INTRAVENOUS | Status: DC | PRN
  Administered 2022-10-27: 200 mg via INTRAVENOUS

## 2022-10-27 MED ORDER — PROPOFOL 10 MG/ML IV BOLUS
INTRAVENOUS | Status: DC | PRN
  Administered 2022-10-27: 50 mg via INTRAVENOUS
  Administered 2022-10-27: 150 mg via INTRAVENOUS

## 2022-10-27 MED ORDER — CHLORHEXIDINE GLUCONATE 0.12 % MT SOLN: 15.0000 mL | Freq: Once | OROMUCOSAL | Status: AC

## 2022-10-27 MED ORDER — CHLORHEXIDINE GLUCONATE 0.12 % MT SOLN
OROMUCOSAL | Status: AC
  Administered 2022-10-27: 15 mL via OROMUCOSAL
  Filled 2022-10-27: qty 15

## 2022-10-27 MED ORDER — FENTANYL CITRATE (PF) 250 MCG/5ML IJ SOLN
INTRAMUSCULAR | Status: DC | PRN
  Administered 2022-10-27 (×3): 50 ug via INTRAVENOUS

## 2022-10-27 MED ORDER — PROPOFOL 500 MG/50ML IV EMUL
INTRAVENOUS | Status: DC | PRN
  Administered 2022-10-27: 100 ug/kg/min via INTRAVENOUS

## 2022-10-27 MED ORDER — LACTATED RINGERS IV SOLN: INTRAVENOUS | Status: DC

## 2022-10-27 MED ORDER — ROCURONIUM BROMIDE 10 MG/ML (PF) SYRINGE
PREFILLED_SYRINGE | INTRAVENOUS | Status: DC | PRN
  Administered 2022-10-27: 50 mg via INTRAVENOUS

## 2022-10-27 MED ORDER — DIPHENHYDRAMINE HCL 50 MG/ML IJ SOLN
INTRAMUSCULAR | Status: AC
  Filled 2022-10-27: qty 1

## 2022-10-27 NOTE — Op Note (Signed)
[facility] Cardiopulmonary Patient Name: Shelby Cardenas Pocedure Date: 10/27/2022 MRN: [id number] Attending MD: Juanito Doom , MD,  Date of Birth: September 09, 1941 CSN: Finalized Age: 81 Admit Type: Outpatient Gender: Female Procedure:             Bronchoscopy Indications:           Interstitial lung disease, Mediastinal adenopathy Providers:             Nathaneil Canary B. Layce Sprung, MD Referring MD:           Medicines:             General Anesthesia Complications:         No immediate complications Estimated Blood Loss:  Estimated blood loss was minimal. Procedure:             Pre-Anesthesia Assessment:                        - A History and Physical has been performed. Patient                         meds and allergies have been reviewed. The risks and                         benefits of the procedure and the sedation options and                         risks were discussed with the patient. All questions                         were answered and informed consent was obtained.                         Patient identification and proposed procedure were                         verified prior to the procedure by the physician and                         the nurse in the pre-procedure area. Mental Status                         Examination: normal. Airway Examination: normal                         oropharyngeal airway. Respiratory Examination: clear                         to auscultation. CV Examination: normal. ASA Grade                         Assessment: II - A patient with mild systemic disease.                         After reviewing the risks and benefits, the patient                         was deemed in satisfactory condition to undergo the  procedure. The anesthesia plan was to use general                         anesthesia. Immediately prior to administration of                         medications, the patient was re-assessed for adequacy                          to receive sedatives. The heart rate, respiratory                         rate, oxygen saturations, blood pressure, adequacy of                         pulmonary ventilation, and response to care were                         monitored throughout the procedure. The physical                         status of the patient was re-assessed after the                         procedure.                        After obtaining informed consent, the bronchoscope was                         passed under direct vision. Throughout the procedure,                         the patient's blood pressure, pulse, and oxygen                         saturations were monitored continuously. the [id number]                         ([id number]) Olympus broncoscope was introduced through                         the mouth, via the endotracheal tube and advanced to                         the tracheobronchial tree. The procedure was                         accomplished without difficulty. The patient tolerated                         the procedure well. The total duration of the                         procedure was 33 minutes. Scope In: Scope Out: Findings:      The nasopharynx/oropharynx appears normal. The larynx appears normal.       The vocal cords appear normal. The subglottic space is normal. The       trachea is of normal  caliber. The carina is sharp. The tracheobronchial       tree of the right lung was examined to at least the first subsegmental       level. Bronchial mucosa and anatomy in the right lung are normal; there       are no endobronchial lesions, and no secretions.      Left Lung Abnormalities: Scant, mucoid, yellow secretions were found       throughout the left tracheobronchial tree. Transbronchial biopsies were       performed in the anterior medial segment of the left lower lobe using       forceps and sent for histopathology examination. The procedure was       guided by  fluoroscopy. Transbronchial biopsy technique was selected       because the sampling site was not visible endoscopically. Four biopsy       passes were performed. Four biopsy samples were obtained. The       bronchoscope was advanced until wedged at the desired location for       bronchoalveolar lavage. BAL was performed in the lingula of the lung and       sent for cell count, bacterial culture, viral smears & culture, and       fungal & AFB analysis and cytology. 60 mL of fluid were instilled. 24 mL       were returned. The return was cloudy. There were no mucoid plugs in the       return fluid. An endobronchial ultrasound endoscope was utilized in       order to assist with fine needle aspiration in the subcarinal area.       Transbronchial needle aspiration of a lymph node was performed using a       Wang 21 gauge needle and sent for routine cytology. The procedure was       guided by ultrasound. Transbronchial needle aspiration technique was       selected because the sampling site was not visible endoscopically. Five       samples were obtained. Impression:            - Interstitial lung disease                        - Mediastinal adenopathy                        - The airway examination of the right lung was normal.                        - Scant, mucoid, yellow secretions were found                         throughout the left tracheobronchial tree.                        - Transbronchial lung biopsies were performed.                        - Bronchoalveolar lavage was performed.                        - Endobronchial ultrasound was performed.                        -  A transbronchial needle aspiration was performed. Moderate Sedation:      General Anesthesia Recommendation:        - Await BAL, biopsy, culture and cytology results. Procedure Code(s):     --- Professional ---                        [phone], Bronchoscopy, rigid or flexible, including                          fluoroscopic guidance, when performed; with                         transbronchial needle aspiration biopsy(s), trachea,                         main stem and/or lobar bronchus(i)                        31628, Bronchoscopy, rigid or flexible, including                         fluoroscopic guidance, when performed; with                         transbronchial lung biopsy(s), single lobe                        75883, Bronchoscopy, rigid or flexible, including                         fluoroscopic guidance, when performed; with bronchial                         alveolar lavage                        31654, Bronchoscopy, rigid or flexible, including                         fluoroscopic guidance, when performed; with                         transendoscopic endobronchial ultrasound (EBUS) during                         bronchoscopic diagnostic or therapeutic                         intervention(s) for peripheral lesion(s) (List                         separately in addition to code for primary                         procedure[s]) Diagnosis Code(s):     --- Professional ---                        J84.9, Interstitial pulmonary disease, unspecified                        R59.0, Localized enlarged lymph nodes  R09.89, Other specified symptoms and signs involving                         the circulatory and respiratory systems CPT copyright 2022 American [employer] Association. All rights reserved. The codes documented in this report are preliminary and upon coder review may  be revised to meet current compliance requirements. Norlene Campbell, MD Juanito Doom, MD 10/27/2022 9:53:48 AM This report has been signed electronically. Number of Addenda: 0

## 2022-10-27 NOTE — Anesthesia Procedure Notes (Signed)
Procedure Name: Intubation Date/Time: 10/27/2022 9:09 AM  Performed by: Mariea Clonts, CRNAPre-anesthesia Checklist: Patient identified, Emergency Drugs available, Suction available and Patient being monitored Patient Re-evaluated:Patient Re-evaluated prior to induction Oxygen Delivery Method: Circle System Utilized Preoxygenation: Pre-oxygenation with 100% oxygen Induction Type: IV induction Ventilation: Mask ventilation without difficulty and Oral airway inserted - appropriate to patient size Laryngoscope Size: Glidescope and 3 Grade View: Grade I Tube type: Oral Tube size: 8.0 mm Number of attempts: 2 Airway Equipment and Method: Stylet and Oral airway Placement Confirmation: ETT inserted through vocal cords under direct vision, positive ETCO2 and breath sounds checked- equal and bilateral Tube secured with: Tape Dental Injury: Teeth and Oropharynx as per pre-operative assessment  Comments: Unable to pass 8.5 ett with a miller 2 blade.  Switched to glidescope go size 3 and an 8.0 ett and able to pass easily.

## 2022-10-27 NOTE — Anesthesia Preprocedure Evaluation (Addendum)
Anesthesia Evaluation  Patient identified by MRN, date of birth, ID band Patient awake    Reviewed: Allergy & Precautions, NPO status , Patient's Chart, lab work & pertinent test results  History of Anesthesia Complications Negative for: history of anesthetic complications  Airway Mallampati: II  TM Distance: >3 FB Neck ROM: Full    Dental  (+) Dental Advisory Given, Teeth Intact   Pulmonary   Diaphragmatic hernia ILD    Pulmonary exam normal        Cardiovascular negative cardio ROS Normal cardiovascular exam     Neuro/Psych negative neurological ROS  negative psych ROS   GI/Hepatic Neg liver ROS,GERD  Controlled,, Fecal incontinence    Endo/Other  negative endocrine ROS    Renal/GU negative Renal ROS    OAB     Musculoskeletal  (+) Arthritis , Osteoarthritis,    Abdominal   Peds  Hematology negative hematology ROS (+)   Anesthesia Other Findings Lupus   Reproductive/Obstetrics                             Anesthesia Physical Anesthesia Plan  ASA: 3  Anesthesia Plan: General   Post-op Pain Management: Minimal or no pain anticipated   Induction: Intravenous  PONV Risk Score and Plan: 3 and Treatment may vary due to age or medical condition and Ondansetron  Airway Management Planned: Oral ETT  Additional Equipment: None  Intra-op Plan:   Post-operative Plan: Extubation in OR  Informed Consent: I have reviewed the patients History and Physical, chart, labs and discussed the procedure including the risks, benefits and alternatives for the proposed anesthesia with the patient or authorized representative who has indicated his/her understanding and acceptance.     Dental advisory given  Plan Discussed with: CRNA and Anesthesiologist  Anesthesia Plan Comments:        Anesthesia Quick Evaluation

## 2022-10-27 NOTE — H&P (Signed)
[facility]  CC: Cough, dyspnea  HPI:  81 y/o female developed cough and dyspnea after having COVID pneumonia, has persistent infiltrates on CT chest and has had slow to resolve symptoms.  Also has mediastinal adenopathy, some calcified.  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 2014: Fracture fibula 2017: -1.8 2019: -1.4 2022: L1 compression fracture   Positive ANA (antinuclear antibody) 10/17/2021   09-2021: 1:40, speckled pattern   Primary open angle glaucoma of both eyes, moderate stage 12/12/2018     Family History  Problem Relation Age of Onset   Arthritis Mother    Arthritis Father    Leukemia Father    Liver disease Brother    Diabetes Brother    Breast cancer Paternal Aunt    Breast cancer Daughter      Social History   Socioeconomic History   Marital status: Married    Spouse name: Not on file   Number of children: Not on file   Years of education: Not on file   Highest education level: Not on file  Occupational History   Not on file  Tobacco Use   Smoking status: Never    Passive exposure: Past   Smokeless tobacco: Never  Vaping Use   Vaping Use: Never used  Substance and Sexual Activity   Alcohol use: Not Currently    Drug use: Never   Sexual activity: Yes  Other Topics Concern   Not on file  Social History Narrative   Not on file   Social Determinants of Health   Financial Resource Strain: Low Risk  (02/01/2022)   Overall Financial Resource Strain (CARDIA)    Difficulty of Paying Living Expenses: Not hard at all  Food Insecurity: No Food Insecurity (02/01/2022)   Hunger Vital Sign    Worried About Running Out of Food in the Last Year: Never true    Ran Out of Food in the Last Year: Never true  Transportation Needs: No Transportation Needs (02/01/2022)   PRAPARE - Hydrologist (Medical): No    Lack of Transportation (Non-Medical): No  Physical Activity: Sufficiently Active (02/01/2022)   Exercise Vital Sign    Days of Exercise per Week: 5 days    Minutes of Exercise per Session: 30 min  Stress: No Stress Concern Present (02/01/2022)   [place]    Feeling of Stress : Not at all  Social Connections: [place] (02/01/2022)   Social Connection and Isolation Panel [NHANES]    Frequency of Communication with Friends and Family: Twice a week    Frequency of Social Gatherings with Friends and Family: Twice a week    Attends Religious Services: More than 4 times per year    Active  Member of Clubs or Organizations: Yes    Attends Archivist Meetings: 1 to 4 times per year    Marital Status: Married  Human resources officer Violence: Not At Risk (02/01/2022)   Humiliation, Afraid, Rape, and Kick questionnaire    Fear of Current or Ex-Partner: No    Emotionally Abused: No    Physically Abused: No    Sexually Abused: No     Allergies  Allergen Reactions   Macrobid [Nitrofurantoin] Rash   Boniva [Ibandronic Acid] Other (See Comments)    Muscle aches /headache   Gabapentin     Dizzy and fell when taking     Prednisone     Makes me feel weird    Tizanidine     Low bp   Tramadol Itching    Ciprofloxacin     weird feeling    Codeine Itching and Rash   Plaquenil [Hydroxychloroquine] Rash   Sulfa Antibiotics Itching and Rash     _0 @ Vitals:   10/25/22 1623 10/27/22 0612  BP:  (!) 167/77  Pulse:  72  Resp:  17  Temp:  98 F (36.7 C)  TempSrc:  Oral  SpO2:  96%  Weight: 73.9 kg 73.9 kg  Height: _1  (1.626 m) _2  (1.626 m)   General:  Resting comfortably in bed HENT: NCAT OP clear PULM: CTA B, normal effort CV: RRR, no mgr GI: BS+, soft, nontender MSK: normal bulk and tone Neuro: awake, alert, no distress, MAEW  CBC    Component Value Date/Time   WBC 9.0 10/27/2022 0609   RBC 4.38 10/27/2022 0609   HGB 13.5 10/27/2022 0609   HCT 40.0 10/27/2022 0609   PLT 257 10/27/2022 0609   MCV 91.3 10/27/2022 0609   MCH 30.8 10/27/2022 0609   MCHC 33.8 10/27/2022 0609   RDW 13.2 10/27/2022 0609   LYMPHSABS 2.5 06/02/2021 1702   MONOABS 1.0 06/02/2021 1702   EOSABS 0.3 06/02/2021 1702   BASOSABS 0.1 06/02/2021 1702   BMET    Component Value Date/Time   NA 129 (L) 07/31/2022 1427   K 4.4 07/31/2022 1427   CL 94 (L) 07/31/2022 1427   CO2 25 07/31/2022 1427   GLUCOSE 99 07/31/2022 1427   BUN 9 07/31/2022 1427   CREATININE 0.65 07/31/2022 1427   CALCIUM 8.9 07/31/2022 1427   GFRNONAA >60 06/02/2021 1702    CT chest : lower lobe predominant ground glass and fibrotic changes in bases  Impression: Interstitial lung disease post COVID Mediastinal adenopathy Dyspnea Cough  Plan: Bronchoscopy with EBUS: BAL, TBBX, FNA lymph nodes under general anesthesia  She knows the risks and benefits and is willing to proceed.  Roselie Awkward, MD [facility] Pager: [phone] Cell: [phone] After 7:00 pm call Elink  [phone]

## 2022-10-27 NOTE — Transfer of Care (Signed)
Immediate Anesthesia Transfer of Care Note  Patient: Shelby Cardenas  Procedure(s) Performed: VIDEO BRONCHOSCOPY WITH ENDOBRONCHIAL ULTRASOUND BRONCHIAL BIOPSIES BRONCHIAL WASHINGS BRONCHIAL NEEDLE ASPIRATION BIOPSIES HEMOSTASIS CONTROL  Patient Location: PACU  Anesthesia Type:General  Level of Consciousness: awake, alert , and oriented  Airway & Oxygen Therapy: Patient Spontanous Breathing and Patient connected to nasal cannula oxygen  Post-op Assessment: Report given to RN, Post -op Vital signs reviewed and stable, and Patient moving all extremities X 4  Post vital signs: Reviewed and stable  Last Vitals:  Vitals Value Taken Time  BP 133/67 10/27/22 1000  Temp    Pulse 80 10/27/22 1000  Resp 20 10/27/22 1000  SpO2 94 % 10/27/22 1000  Vitals shown include unvalidated device data.  Last Pain:  Vitals:   10/27/22 0619  TempSrc:   PainSc: 0-No pain      Patients Stated Pain Goal: 0 (96/75/91 6384)  Complications: No notable events documented.

## 2022-10-27 NOTE — Anesthesia Postprocedure Evaluation (Signed)
Anesthesia Post Note  Patient: Shelby Cardenas  Procedure(s) Performed: VIDEO BRONCHOSCOPY WITH ENDOBRONCHIAL ULTRASOUND BRONCHIAL BIOPSIES BRONCHIAL WASHINGS BRONCHIAL NEEDLE ASPIRATION BIOPSIES HEMOSTASIS CONTROL     Patient location during evaluation: PACU Anesthesia Type: General Level of consciousness: awake and alert Pain management: pain level controlled Vital Signs Assessment: post-procedure vital signs reviewed and stable Respiratory status: spontaneous breathing, nonlabored ventilation and respiratory function stable Cardiovascular status: stable and blood pressure returned to baseline Anesthetic complications: no   No notable events documented.  Last Vitals:  Vitals:   10/27/22 1015 10/27/22 1030  BP: 135/66 (!) 152/69  Pulse: 69 64  Resp: 15 15  Temp:  36.7 C  SpO2: 91% 96%    Last Pain:  Vitals:   10/27/22 1030  TempSrc:   PainSc: 0-No pain                 Audry Pili

## 2022-10-28 LAB — NOVEL CORONAVIRUS, NAA: SARS-CoV-2, NAA: NOT DETECTED

## 2022-10-29 ENCOUNTER — Encounter (HOSPITAL_COMMUNITY): Payer: Self-pay | Admitting: Pulmonary Disease

## 2022-10-29 LAB — ACID FAST SMEAR (AFB, MYCOBACTERIA): Acid Fast Smear: NEGATIVE

## 2022-10-29 LAB — CULTURE, BAL-QUANTITATIVE W GRAM STAIN: Culture: NO GROWTH

## 2022-10-31 LAB — CYTOLOGY - NON PAP

## 2022-10-31 LAB — SURGICAL PATHOLOGY

## 2022-11-01 ENCOUNTER — Other Ambulatory Visit: Payer: Self-pay | Admitting: Pulmonary Disease

## 2022-11-01 DIAGNOSIS — J849 Interstitial pulmonary disease, unspecified: Secondary | ICD-10-CM

## 2022-11-01 LAB — AEROBIC/ANAEROBIC CULTURE W GRAM STAIN (SURGICAL/DEEP WOUND)
Culture: NO GROWTH
Gram Stain: NONE SEEN

## 2022-11-02 ENCOUNTER — Ambulatory Visit (INDEPENDENT_AMBULATORY_CARE_PROVIDER_SITE_OTHER): Payer: Medicare PPO | Admitting: Pulmonary Disease

## 2022-11-02 DIAGNOSIS — J849 Interstitial pulmonary disease, unspecified: Secondary | ICD-10-CM | POA: Diagnosis not present

## 2022-11-02 LAB — PULMONARY FUNCTION TEST
DL/VA % pred: 98 %
DL/VA: 4.04 ml/min/mmHg/L
DLCO cor % pred: 77 %
DLCO cor: 13.98 ml/min/mmHg
DLCO unc % pred: 77 %
DLCO unc: 13.98 ml/min/mmHg
FEF 25-75 Post: 2.82 L/sec
FEF 25-75 Pre: 2.22 L/sec
FEF2575-%Change-Post: 26 %
FEF2575-%Pred-Post: 216 %
FEF2575-%Pred-Pre: 171 %
FEV1-%Change-Post: 4 %
FEV1-%Pred-Post: 106 %
FEV1-%Pred-Pre: 102 %
FEV1-Post: 1.93 L
FEV1-Pre: 1.85 L
FEV1FVC-%Change-Post: 3 %
FEV1FVC-%Pred-Pre: 113 %
FEV6-%Change-Post: 1 %
FEV6-%Pred-Post: 97 %
FEV6-%Pred-Pre: 96 %
FEV6-Post: 2.24 L
FEV6-Pre: 2.22 L
FEV6FVC-%Pred-Post: 106 %
FEV6FVC-%Pred-Pre: 106 %
FVC-%Change-Post: 1 %
FVC-%Pred-Post: 91 %
FVC-%Pred-Pre: 90 %
FVC-Post: 2.24 L
FVC-Pre: 2.22 L
Post FEV1/FVC ratio: 86 %
Post FEV6/FVC ratio: 100 %
Pre FEV1/FVC ratio: 84 %
Pre FEV6/FVC Ratio: 100 %
RV % pred: 88 %
RV: 2.09 L
TLC % pred: 88 %
TLC: 4.35 L

## 2022-11-02 NOTE — Progress Notes (Signed)
PFT done today. 

## 2022-11-03 ENCOUNTER — Encounter: Payer: Self-pay | Admitting: Pulmonary Disease

## 2022-11-03 ENCOUNTER — Ambulatory Visit: Payer: Medicare PPO | Admitting: Pulmonary Disease

## 2022-11-03 VITALS — BP 118/70 | HR 77 | Temp 98.2°F | Ht 64.0 in | Wt 162.4 lb

## 2022-11-03 DIAGNOSIS — J8410 Pulmonary fibrosis, unspecified: Secondary | ICD-10-CM

## 2022-11-03 DIAGNOSIS — R0600 Dyspnea, unspecified: Secondary | ICD-10-CM | POA: Diagnosis not present

## 2022-11-03 DIAGNOSIS — R9389 Abnormal findings on diagnostic imaging of other specified body structures: Secondary | ICD-10-CM | POA: Diagnosis not present

## 2022-11-03 NOTE — Patient Instructions (Signed)
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

## 2022-11-03 NOTE — Progress Notes (Signed)
Synopsis: Referred in November 2023 for cough, pneumonia after COVID.  Had calcified lymph nodes noted on CT scanning of the chest.  Bronchoscopy December 2023 showed no evidence of malignancy, granuloma, eosinophils or organizing pneumonia noted on transbronchial biopsy, fine-needle aspiration of lymph nodes or BAL.  Subjective:   PATIENT ID: Shelby Cardenas GENDER: female DOB: 03-Jun-1941, MRN: [id number]   HPI  Chief Complaint  Patient presents with   Follow-up    Pt is here for PFT results.    Shelby Cardenas is here to follow-up on her bronchoscopy.  She has been doing okay.  She coughed up a little bit of dark blood after the procedure but for the most part things been fine.  She has not had significant shortness of breath or cough at home.  She does still have some knee pain and she has been talking about getting a orthopedic surgery.  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 2014: Fracture fibula 2017: -1.8 2019: -1.4 2022: L1 compression fracture   Positive ANA (antinuclear antibody) 10/17/2021   09-2021: 1:40, speckled pattern   Primary open angle glaucoma of both eyes, moderate stage 12/12/2018    Review of Systems  Constitutional:  Negative  for chills, fever, malaise/fatigue and weight loss.  HENT:  Negative for congestion, sinus pain and sore throat.   Respiratory:  Positive for cough, sputum production and shortness of breath.   Cardiovascular:  Negative for chest pain and leg swelling.     Objective:  Physical Exam   Vitals:   11/03/22 1054  BP: 118/70  Pulse: 77  Temp: 98.2 F (36.8 C)  TempSrc: Oral  SpO2: 98%  Weight: 162 lb 6.4 oz (73.7 kg)  Height: _0  (1.626 m)    Gen: well appearing HENT: OP clear, neck supple PULM: Crackles bases B, normal effort  CV: RRR, no mgr GI: BS+, soft, nontender Derm: no cyanosis or rash Psyche: normal mood and affect    CBC    Component Value Date/Time   WBC 9.0 10/27/2022 0609   RBC 4.38 10/27/2022 0609   HGB 13.5 10/27/2022 0609   HCT 40.0 10/27/2022 0609   PLT 257 10/27/2022 0609   MCV 91.3 10/27/2022 0609   MCH 30.8 10/27/2022 0609   MCHC 33.8 10/27/2022 0609   RDW 13.2 10/27/2022 0609   LYMPHSABS 2.5 06/02/2021 1702   MONOABS 1.0 06/02/2021 1702   EOSABS 0.3 06/02/2021 1702   BASOSABS 0.1 06/02/2021 1702     Chest imaging: August 30, 2022 chest x-ray 2 view shows significant interstitial changes bilaterally and right middle lobe consolidation November 2023 high-resolution CT scan of the chest images independently reviewed showing significant groundglass opacification, nodularity and mild traction bronchiectasis more predominant in the bases of both lungs, nonspecific interstitial, fibrotic change  not consistent with UIP, upper lobes relatively spared, dense scarring RML, calcified lymph nodes noted, 0.7 cm pulmonary nodule in left upper lobe  PFT: January 2024 ratio 86%, FVC 2.24 L 91% predicted, total lung capacity 4.35 L 88% predicted DLCO 13.98 77% predicted  Labs: November 2023 ANA +1-80, CCP negative, SSA, SSB negative, extended myositis panel negative, aldolase negative, ENA negative, RNP negative, MPO/PR-3 both negative, centromere, SCL  negative both negative, hypersensitivity pneumonitis negative, double-stranded DNA negative December 2023 BAL cell count: 165 WBC, differential white blood cell 39% PMN, 41% mono, 3% eos, 17%lymph December 2023 BAL no growth to date December 2023 BAL fungus no growth to date December 2023 BAL AFB no growth to date  Path: December 2023 trans bronchial biopsy: Lung parenchyma with no specific histopathological changes, negative for granuloma or increased eosinophils, no evidence of organizing pneumonia or malignancy December 2023 BAL cytology negative for malignancy December 2023 station 7 lymph node cytology negative for malignancy  Echo:  Heart Catheterization:       Assessment & Plan:   Abnormal CXR - Plan: Pulmonary function test  Dyspnea, unspecified type - Plan: Pulmonary function test  Pulmonary fibrosis, postinflammatory (HCC)  Discussion: Shelby Cardenas has an abnormal CT scan with interstitial changes bilaterally, calcified lymphadenopathy which after bronchoscopy there is no clear evidence of an underlying progressive, malignant or inflammatory problem.  The most likely explanation is COVID and subsequent bacterial pneumonia.,  Malignant or inflammatory problem.  The most likely explanation is COVID and subsequent bacterial pneumonia.  However, we need to keep an eye on this to make sure it does not progress.  Fortunately lung function testing only showed mild abnormality.  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  Follow-up with me in 6 months or sooner if  needed.  Immunizations: Immunization History  Administered Date(s) Administered   Fluad Quad(high Dose 65+) 09/13/2021   Influenza, High Dose Seasonal PF 09/05/2013, 08/17/2016, 08/13/2017   PFIZER(Purple Top)SARS-COV-2 Vaccination 11/15/2019, 12/06/2019   Pneumococcal Conjugate-13 05/05/2015   Pneumococcal Polysaccharide-23 03/05/2007   Td 01/13/2022   Td,absorbed, Preservative Free, Adult Use, Lf Unspecified 10/30/1996   Tdap 03/05/2007   Zoster Recombinat (Shingrix) 12/07/2021, 04/12/2022   Zoster, Live 10/20/2009     Current Outpatient Medications:    Alpha Lipoic Acid 200 MG CAPS, Take 200 mg by mouth daily., Disp: , Rfl:    Ascorbic Acid (VITAMIN C PO), Take 1 tablet by mouth daily., Disp: , Rfl:    b complex vitamins capsule, Take 1 capsule by mouth daily., Disp: , Rfl:    Calcium-Phosphorus-Vitamin D (CITRACAL +D3 PO), Take 1 capsule by mouth daily., Disp: , Rfl:    COLLAGEN PO, Take 1 Scoop by mouth daily. Coffee, Disp: , Rfl:    diclofenac (VOLTAREN) 75 MG EC tablet, Take 75 mg by mouth 2 (two) times daily. Take one tablet by mouth. Twice a day with food as needed for pain/inflammation., Disp: , Rfl:    diclofenac Sodium (VOLTAREN) 1 % GEL, Apply 2 g topically at bedtime. Knee pain, Disp: , Rfl:    Glycerin-Hypromellose-PEG 400 (DRY EYE RELIEF DROPS OP), Place 1 drop into both eyes 2 (two) times  daily., Disp: , Rfl:    latanoprost (XALATAN) 0.005 % ophthalmic solution, Place 1 drop into both eyes at bedtime., Disp: , Rfl:    Multiple Vitamin (MULTIVITAMIN ADULT PO), Take 1 tablet by mouth daily. Balance of nature, Disp: , Rfl:    Turmeric Curcumin 500 MG CAPS, Take 500 mg by mouth daily., Disp: , Rfl:

## 2022-11-08 ENCOUNTER — Encounter: Payer: Self-pay | Admitting: Family Medicine

## 2022-11-08 DIAGNOSIS — R5381 Other malaise: Secondary | ICD-10-CM

## 2022-11-10 DIAGNOSIS — M25562 Pain in left knee: Secondary | ICD-10-CM | POA: Diagnosis not present

## 2022-11-10 DIAGNOSIS — M25561 Pain in right knee: Secondary | ICD-10-CM | POA: Diagnosis not present

## 2022-11-10 DIAGNOSIS — R262 Difficulty in walking, not elsewhere classified: Secondary | ICD-10-CM | POA: Diagnosis not present

## 2022-11-14 ENCOUNTER — Ambulatory Visit: Payer: Medicare PPO | Admitting: Pulmonary Disease

## 2022-11-14 DIAGNOSIS — M25562 Pain in left knee: Secondary | ICD-10-CM | POA: Diagnosis not present

## 2022-11-14 DIAGNOSIS — M25561 Pain in right knee: Secondary | ICD-10-CM | POA: Diagnosis not present

## 2022-11-14 DIAGNOSIS — R262 Difficulty in walking, not elsewhere classified: Secondary | ICD-10-CM | POA: Diagnosis not present

## 2022-11-16 DIAGNOSIS — R262 Difficulty in walking, not elsewhere classified: Secondary | ICD-10-CM | POA: Diagnosis not present

## 2022-11-16 DIAGNOSIS — M25562 Pain in left knee: Secondary | ICD-10-CM | POA: Diagnosis not present

## 2022-11-16 DIAGNOSIS — M25561 Pain in right knee: Secondary | ICD-10-CM | POA: Diagnosis not present

## 2022-11-21 DIAGNOSIS — M25561 Pain in right knee: Secondary | ICD-10-CM | POA: Diagnosis not present

## 2022-11-21 DIAGNOSIS — R262 Difficulty in walking, not elsewhere classified: Secondary | ICD-10-CM | POA: Diagnosis not present

## 2022-11-21 DIAGNOSIS — M25562 Pain in left knee: Secondary | ICD-10-CM | POA: Diagnosis not present

## 2022-11-23 DIAGNOSIS — M25562 Pain in left knee: Secondary | ICD-10-CM | POA: Diagnosis not present

## 2022-11-23 DIAGNOSIS — M25561 Pain in right knee: Secondary | ICD-10-CM | POA: Diagnosis not present

## 2022-11-23 DIAGNOSIS — R262 Difficulty in walking, not elsewhere classified: Secondary | ICD-10-CM | POA: Diagnosis not present

## 2022-11-27 LAB — FUNGUS CULTURE WITH STAIN

## 2022-11-27 LAB — FUNGUS CULTURE RESULT

## 2022-11-27 LAB — FUNGAL ORGANISM REFLEX

## 2022-11-28 DIAGNOSIS — M25561 Pain in right knee: Secondary | ICD-10-CM | POA: Diagnosis not present

## 2022-11-28 DIAGNOSIS — R262 Difficulty in walking, not elsewhere classified: Secondary | ICD-10-CM | POA: Diagnosis not present

## 2022-11-28 DIAGNOSIS — M25562 Pain in left knee: Secondary | ICD-10-CM | POA: Diagnosis not present

## 2022-11-30 DIAGNOSIS — R262 Difficulty in walking, not elsewhere classified: Secondary | ICD-10-CM | POA: Diagnosis not present

## 2022-11-30 DIAGNOSIS — M25562 Pain in left knee: Secondary | ICD-10-CM | POA: Diagnosis not present

## 2022-11-30 DIAGNOSIS — M25561 Pain in right knee: Secondary | ICD-10-CM | POA: Diagnosis not present

## 2022-12-05 DIAGNOSIS — M25562 Pain in left knee: Secondary | ICD-10-CM | POA: Diagnosis not present

## 2022-12-05 DIAGNOSIS — R262 Difficulty in walking, not elsewhere classified: Secondary | ICD-10-CM | POA: Diagnosis not present

## 2022-12-05 DIAGNOSIS — M25561 Pain in right knee: Secondary | ICD-10-CM | POA: Diagnosis not present

## 2022-12-07 DIAGNOSIS — R262 Difficulty in walking, not elsewhere classified: Secondary | ICD-10-CM | POA: Diagnosis not present

## 2022-12-07 DIAGNOSIS — M25562 Pain in left knee: Secondary | ICD-10-CM | POA: Diagnosis not present

## 2022-12-07 DIAGNOSIS — M25561 Pain in right knee: Secondary | ICD-10-CM | POA: Diagnosis not present

## 2022-12-12 DIAGNOSIS — M25561 Pain in right knee: Secondary | ICD-10-CM | POA: Diagnosis not present

## 2022-12-12 DIAGNOSIS — R262 Difficulty in walking, not elsewhere classified: Secondary | ICD-10-CM | POA: Diagnosis not present

## 2022-12-12 DIAGNOSIS — M25562 Pain in left knee: Secondary | ICD-10-CM | POA: Diagnosis not present

## 2022-12-12 LAB — ACID FAST CULTURE WITH REFLEXED SENSITIVITIES (MYCOBACTERIA): Acid Fast Culture: NEGATIVE

## 2022-12-14 DIAGNOSIS — R262 Difficulty in walking, not elsewhere classified: Secondary | ICD-10-CM | POA: Diagnosis not present

## 2022-12-14 DIAGNOSIS — M25561 Pain in right knee: Secondary | ICD-10-CM | POA: Diagnosis not present

## 2022-12-14 DIAGNOSIS — M25562 Pain in left knee: Secondary | ICD-10-CM | POA: Diagnosis not present

## 2022-12-19 DIAGNOSIS — R262 Difficulty in walking, not elsewhere classified: Secondary | ICD-10-CM | POA: Diagnosis not present

## 2022-12-19 DIAGNOSIS — M25561 Pain in right knee: Secondary | ICD-10-CM | POA: Diagnosis not present

## 2022-12-19 DIAGNOSIS — M25562 Pain in left knee: Secondary | ICD-10-CM | POA: Diagnosis not present

## 2022-12-21 DIAGNOSIS — M25562 Pain in left knee: Secondary | ICD-10-CM | POA: Diagnosis not present

## 2022-12-21 DIAGNOSIS — M25561 Pain in right knee: Secondary | ICD-10-CM | POA: Diagnosis not present

## 2022-12-21 DIAGNOSIS — R262 Difficulty in walking, not elsewhere classified: Secondary | ICD-10-CM | POA: Diagnosis not present

## 2022-12-23 ENCOUNTER — Ambulatory Visit
Admission: EM | Admit: 2022-12-23 | Discharge: 2022-12-23 | Disposition: A | Discharge status: Home / Self Care | Payer: Medicare PPO | Attending: Nurse Practitioner | Admitting: Nurse Practitioner

## 2022-12-23 DIAGNOSIS — R051 Acute cough: Secondary | ICD-10-CM

## 2022-12-23 DIAGNOSIS — B349 Viral infection, unspecified: Secondary | ICD-10-CM | POA: Insufficient documentation

## 2022-12-23 DIAGNOSIS — Z1152 Encounter for screening for COVID-19: Secondary | ICD-10-CM | POA: Diagnosis not present

## 2022-12-23 DIAGNOSIS — R519 Headache, unspecified: Secondary | ICD-10-CM | POA: Diagnosis present

## 2022-12-23 LAB — POCT INFLUENZA A/B
Influenza A, POC: NEGATIVE
Influenza B, POC: NEGATIVE

## 2022-12-23 MED ORDER — BENZONATATE 100 MG PO CAPS: 100.0000 mg | ORAL_CAPSULE | Freq: Three times a day (TID) | ORAL | 0 refills | Status: DC

## 2022-12-23 NOTE — ED Triage Notes (Signed)
Pt reports cough, body aches, headache since lats night. Advil relief headache. Reports she before. never had a headache. Pt had acid reflux last night.

## 2022-12-23 NOTE — Discharge Instructions (Signed)
Your symptoms and exam are consistent for a viral illness. Please treat your symptoms with over the counter tylenol or ibuprofen, humidifier, and rest. Tessalon as needed for cough. Viral illnesses can last 7-14 days. Please follow up with your PCP if your symptoms are not improving. Please go to the ER for any worsening symptoms. This includes but is not limited to fever you can not control with tylenol or ibuprofen, you are not able to stay hydrated, you have shortness of breath or chest pain.  Thank you for choosing [facility] for your healthcare needs. I hope you feel better soon!

## 2022-12-23 NOTE — ED Provider Notes (Signed)
[facility]    CSN: [id number] Arrival date & time: 12/23/22  1033      History   Chief Complaint Chief Complaint  Patient presents with   Cough    Body aches, cough, headache - Entered by patient   Generalized Body Aches         HPI Shelby Cardenas is a 82 y.o. female  presents for evaluation of URI symptoms for 1 days. Patient reports associated symptoms of cough, headache, body aches, congestion, sore throat, headache.  Denies headache as worst headache of her life.  Denies N/V/D, shortness of breath, ear pain. Patient does not have a hx of asthma or smoking. No known sick contacts.  Pt has taken Advil OTC for symptoms. Pt has no other concerns at this time.    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   Osteoarthritis of hands, bilateral 10/12/2021   Intervertebral disc disorder with radiculopathy of lumbar region 09/06/2021   Closed wedge compression fracture of first lumbar vertebra ([place]) 06/09/2021   OAB (overactive bladder) 11/12/2020   Primary open angle glaucoma of both eyes, moderate stage 12/12/2018   Mammogram abnormal 07/16/2018   Cystocele with prolapse 07/02/2018   Adenomatous colon polyp 04/11/2016   Mixed hyperlipidemia 02/06/2016   Osteopenia 02/06/2016   Diaphragmatic hernia 02/06/2016   Allergic rhinitis 02/06/2016    Past Surgical History:  Procedure Laterality Date   BRONCHIAL BIOPSY  10/27/2022   Procedure: BRONCHIAL BIOPSIES;  Surgeon: Juanito Doom, MD;  Location: [facility];  Service: Cardiopulmonary;;   BRONCHIAL NEEDLE ASPIRATION BIOPSY  10/27/2022   Procedure: BRONCHIAL NEEDLE ASPIRATION BIOPSIES;  Surgeon: Juanito Doom, MD;  Location: [facility];  Service: Cardiopulmonary;;   BRONCHIAL WASHINGS  10/27/2022   Procedure: BRONCHIAL WASHINGS;  Surgeon: Juanito Doom, MD;  Location: [facility];  Service: Cardiopulmonary;;   CESAREAN SECTION     CHOLECYSTECTOMY     EYE SURGERY     Bilateral Cataracts   HEMOSTASIS CONTROL  10/27/2022   Procedure: HEMOSTASIS CONTROL;  Surgeon: Juanito Doom, MD;  Location: [facility] ENDOSCOPY;  Service: Cardiopulmonary;;   KNEE  SURGERY Left    NEUROPLASTY / TRANSPOSITION MEDIAN NERVE AT CARPAL TUNNEL  05/2021   Back   SKIN BIOPSY  10/05/2022   left upper arm   SKIN CANCER EXCISION     TONSILLECTOMY     VIDEO BRONCHOSCOPY WITH ENDOBRONCHIAL ULTRASOUND N/A 10/27/2022   Procedure: VIDEO BRONCHOSCOPY WITH ENDOBRONCHIAL ULTRASOUND;  Surgeon: Juanito Doom, MD;   Location: [facility];  Service: Cardiopulmonary;  Laterality: N/A;    OB History   No obstetric history on file.      Home Medications    Prior to Admission medications   Medication Sig Start Date End Date Taking? Authorizing Provider  benzonatate (TESSALON) 100 MG capsule Take 1 capsule (100 mg total) by mouth every 8 (eight) hours. 12/23/22  Yes Melynda Ripple, NP  Alpha Lipoic Acid 200 MG CAPS Take 200 mg by mouth daily.    [provider]  Ascorbic Acid (VITAMIN C PO) Take 1 tablet by mouth daily.    [provider]  b complex vitamins capsule Take 1 capsule by mouth daily.    [provider]  Calcium-Phosphorus-Vitamin D (CITRACAL +D3 PO) Take 1 capsule by mouth daily.    [provider]  COLLAGEN PO Take 1 Scoop by mouth daily. Coffee    [provider]  diclofenac (VOLTAREN) 75 MG EC tablet Take 75 mg by mouth 2 (two) times daily. Take one tablet by mouth. Twice a day with food as needed for pain/inflammation. 05/03/22   Czinsky, Jennifer L, PA  diclofenac Sodium (VOLTAREN) 1 % GEL Apply 2 g topically at bedtime. Knee pain    [provider]  Glycerin-Hypromellose-PEG 400 (DRY EYE RELIEF DROPS OP) Place 1 drop into both eyes 2 (two) times daily.    [provider]  latanoprost (XALATAN) 0.005 % ophthalmic solution Place 1 drop into both eyes at bedtime. 12/15/15   [provider]  Multiple Vitamin (MULTIVITAMIN ADULT PO) Take 1 tablet by mouth daily. Balance of nature    [provider]  Turmeric Curcumin 500 MG CAPS Take 500 mg by mouth daily.    [provider]    Family History Family History  Problem Relation Age of Onset   Arthritis Mother    Arthritis Father    Leukemia Father    Liver disease Brother    Diabetes Brother    Breast cancer Paternal Aunt    Breast cancer Daughter     Social History Social History   Tobacco Use   Smoking status: Never    Passive exposure: Past    Smokeless tobacco: Never  Vaping Use   Vaping Use: Never used  Substance Use Topics   Alcohol use: Not Currently   Drug use: Never     Allergies   Macrobid [nitrofurantoin], Boniva [ibandronic acid], Gabapentin, Prednisone, Tizanidine, Tramadol, Ciprofloxacin, Codeine, Plaquenil [hydroxychloroquine], and Sulfa antibiotics   Review of Systems Review of Systems  HENT:  Positive for congestion and sore throat.   Respiratory:  Positive for cough.   Musculoskeletal:  Positive for myalgias.  Neurological:  Positive for headaches.     Physical Exam Triage Vital Signs ED Triage Vitals  Enc Vitals Group     BP 12/23/22 1200 (!) 163/76     Pulse Rate 12/23/22 1200 99     Resp 12/23/22 1200 16     Temp 12/23/22 1200 98.7 F (37.1 C)     Temp Source 12/23/22 1200 Oral     SpO2 12/23/22 1200  95 %     Weight --      Height --      Head Circumference --      Peak Flow --      Pain Score 12/23/22 1203 8     Pain Loc --      Pain Edu? --      Excl. in [place]? --    No data found.  Updated Vital Signs BP (!) 163/76 (BP Location: Left Arm)   Pulse 99   Temp 98.7 F (37.1 C) (Oral)   Resp 16   SpO2 95%   Visual Acuity Right Eye Distance:   Left Eye Distance:   Bilateral Distance:    Right Eye Near:   Left Eye Near:    Bilateral Near:     Physical Exam Vitals and nursing note reviewed.  Constitutional:      General: She is not in acute distress.    Appearance: She is well-developed. She is not ill-appearing.  HENT:     Head: Normocephalic and atraumatic.     Right Ear: Tympanic membrane and ear canal normal.     Left Ear: Tympanic membrane and ear canal normal.     Nose: Congestion present.     Mouth/Throat:     Mouth: Mucous membranes are moist.     Pharynx: Oropharynx is clear. Uvula midline. Posterior oropharyngeal erythema present.     Tonsils: No tonsillar exudate or tonsillar abscesses.  Eyes:     Conjunctiva/sclera: Conjunctivae normal.     Pupils: Pupils  are equal, round, and reactive to light.  Cardiovascular:     Rate and Rhythm: Normal rate and regular rhythm.     Heart sounds: Normal heart sounds.  Pulmonary:     Effort: Pulmonary effort is normal.     Breath sounds: Normal breath sounds.  Musculoskeletal:     Cervical back: Normal range of motion and neck supple.  Lymphadenopathy:     Cervical: No cervical adenopathy.  Skin:    General: Skin is warm and dry.  Neurological:     General: No focal deficit present.     Mental Status: She is alert and oriented to person, place, and time.  Psychiatric:        Mood and Affect: Mood normal.        Behavior: Behavior normal.      UC Treatments / Results  Labs (all labs ordered are listed, but only abnormal results are displayed) Labs Reviewed  SARS CORONAVIRUS 2 (TAT 6-24 HRS)  POCT INFLUENZA A/B    EKG   Radiology No results found.  Procedures Procedures (including critical care time)  Medications Ordered in UC Medications - No data to display  Initial Impression / Assessment and Plan / UC Course  I have reviewed the triage vital signs and the nursing notes.  Pertinent labs & imaging results that were available during my care of the patient were reviewed by me and considered in my medical decision making (see chart for details).     Negative rapid flu.  COVID PCR and will contact if positive Discussed viral illness and symptomatic treatment Tessalon prn Cough Rest and fluids PCP follow-up 2 to 3 days for recheck ER precautions reviewed and patient verbalized understanding Final Clinical Impressions(s) / UC Diagnoses   Final diagnoses:  Acute cough  Viral illness     Discharge Instructions      Your symptoms and exam are consistent for a viral illness. Please treat your  symptoms with over the counter tylenol or ibuprofen, humidifier, and rest. Tessalon as needed for cough. Viral illnesses can last 7-14 days. Please follow up with your PCP if your  symptoms are not improving. Please go to the ER for any worsening symptoms. This includes but is not limited to fever you can not control with tylenol or ibuprofen, you are not able to stay hydrated, you have shortness of breath or chest pain.  Thank you for choosing [facility] for your healthcare needs. I hope you feel better soon!      ED Prescriptions     Medication Sig Dispense Auth. Provider   benzonatate (TESSALON) 100 MG capsule Take 1 capsule (100 mg total) by mouth every 8 (eight) hours. 21 capsule Melynda Ripple, NP      PDMP not reviewed this encounter.   Melynda Ripple, NP 12/23/22 1251

## 2022-12-24 LAB — SARS CORONAVIRUS 2 (TAT 6-24 HRS): SARS Coronavirus 2: NEGATIVE

## 2022-12-27 ENCOUNTER — Ambulatory Visit: Payer: Medicare PPO | Admitting: Family Medicine

## 2022-12-27 ENCOUNTER — Encounter: Payer: Self-pay | Admitting: Family Medicine

## 2022-12-27 VITALS — BP 124/70 | HR 82 | Temp 97.9°F | Ht 64.0 in | Wt 170.6 lb

## 2022-12-27 DIAGNOSIS — J069 Acute upper respiratory infection, unspecified: Secondary | ICD-10-CM | POA: Insufficient documentation

## 2022-12-27 LAB — POCT INFLUENZA A/B: Influenza A, POC: NEGATIVE

## 2022-12-27 LAB — POC COVID19 BINAXNOW: SARS Coronavirus 2 Ag: NEGATIVE

## 2022-12-27 MED ORDER — HYDROCODONE BIT-HOMATROP MBR 5-1.5 MG/5ML PO SOLN: 5.0000 mL | Freq: Three times a day (TID) | ORAL | 0 refills | Status: DC | PRN

## 2022-12-27 NOTE — Assessment & Plan Note (Signed)
Discussed home care for viral illness, including rest, pushing fluids, and OTC medications as needed for symptom relief. Recommend hot tea with honey for sore throat symptoms. I will add hydrocodone syrup for cough. Follow-up if needed for worsening or persistent symptoms.

## 2022-12-27 NOTE — Progress Notes (Signed)
[facility] [address] [place] [address] Dept: [phone] Dept Fax: [phone]  Office Visit  Subjective:    Patient ID: Shelby Cardenas, female    DOB: 25-Mar-1941, 82 y.o..   MRN: [id number]  Chief Complaint  Patient presents with   Cough    C/o having cough, HA, body aches x 5 days.  Has taken Advil and cough meds.     History of Present Illness:  Patient is in today for with a 5-day history of headache, body aches, and cough. She is not running fever. she feels she is drinking and eating well. She was seen at UC 3 days ago and tested negative for influenza and COVID. [name] husband has not been sick. She is using Tessalon for cough. She is alternating Tylenol and Advil.  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   Osteoarthritis of hands, bilateral 10/12/2021   Intervertebral disc disorder with radiculopathy of lumbar region 09/06/2021   Closed wedge compression fracture of first lumbar vertebra ([place]) 06/09/2021   OAB (overactive bladder) 11/12/2020   Primary open angle glaucoma of both eyes, moderate stage 12/12/2018   Mammogram abnormal 07/16/2018   Cystocele with prolapse 07/02/2018   Adenomatous colon polyp 04/11/2016   Mixed hyperlipidemia 02/06/2016   Osteopenia 02/06/2016   Diaphragmatic hernia 02/06/2016   Allergic rhinitis 02/06/2016   Past Surgical History:  Procedure Laterality Date   BRONCHIAL BIOPSY  10/27/2022   Procedure: BRONCHIAL BIOPSIES;  Surgeon: Juanito Doom, MD;  Location: [facility];  Service: Cardiopulmonary;;   BRONCHIAL  NEEDLE ASPIRATION BIOPSY  10/27/2022   Procedure: BRONCHIAL NEEDLE ASPIRATION BIOPSIES;  Surgeon: Juanito Doom, MD;  Location: [facility];  Service: Cardiopulmonary;;   BRONCHIAL WASHINGS  10/27/2022   Procedure: BRONCHIAL WASHINGS;  Surgeon: Juanito Doom, MD;  Location: [facility];  Service: Cardiopulmonary;;   CESAREAN SECTION     CHOLECYSTECTOMY     EYE SURGERY     Bilateral Cataracts   HEMOSTASIS CONTROL  10/27/2022   Procedure: HEMOSTASIS CONTROL;  Surgeon: Juanito Doom, MD;  Location: [facility];  Service: Cardiopulmonary;;   KNEE SURGERY Left    NEUROPLASTY / TRANSPOSITION MEDIAN NERVE AT [facility]  05/2021   Back   SKIN BIOPSY  10/05/2022   left upper arm   SKIN CANCER EXCISION     TONSILLECTOMY     VIDEO BRONCHOSCOPY WITH ENDOBRONCHIAL ULTRASOUND N/A 10/27/2022   Procedure: VIDEO BRONCHOSCOPY WITH ENDOBRONCHIAL ULTRASOUND;  Surgeon: Juanito Doom, MD;  Location: [facility];  Service: Cardiopulmonary;  Laterality: N/A;   Family History  Problem Relation Age of Onset   Arthritis Mother    Arthritis Father    Leukemia Father    Liver disease Brother    Diabetes Brother    Breast cancer Paternal Aunt    Breast cancer Daughter    Outpatient Medications Prior to Visit  Medication Sig Dispense Refill   dextromethorphan-guaiFENesin (MUCINEX DM) 30-600 MG 12hr tablet Take 1 tablet by mouth 2 (two) times daily.     Alpha Lipoic Acid 200 MG CAPS Take 200 mg by mouth daily.     Ascorbic Acid (  VITAMIN C PO) Take 1 tablet by mouth daily.     b complex vitamins capsule Take 1 capsule by mouth daily.     benzonatate (TESSALON) 100 MG capsule Take 1 capsule (100 mg total) by mouth every 8 (eight) hours. 21 capsule 0   Calcium-Phosphorus-Vitamin D (CITRACAL +D3 PO) Take 1 capsule by mouth daily.     COLLAGEN PO Take 1 Scoop by mouth daily. Coffee     diclofenac (VOLTAREN) 75 MG EC tablet Take 75 mg by mouth 2 (two) times daily. Take one tablet by  mouth. Twice a day with food as needed for pain/inflammation.     diclofenac Sodium (VOLTAREN) 1 % GEL Apply 2 g topically at bedtime. Knee pain     Glycerin-Hypromellose-PEG 400 (DRY EYE RELIEF DROPS OP) Place 1 drop into both eyes 2 (two) times daily.     latanoprost (XALATAN) 0.005 % ophthalmic solution Place 1 drop into both eyes at bedtime.     Multiple Vitamin (MULTIVITAMIN ADULT PO) Take 1 tablet by mouth daily. Balance of nature     Turmeric Curcumin 500 MG CAPS Take 500 mg by mouth daily.     No facility-administered medications prior to visit.   Allergies  Allergen Reactions   Macrobid [Nitrofurantoin] Rash   Boniva [Ibandronic Acid] Other (See Comments)    Muscle aches /headache   Gabapentin     Dizzy and fell when taking     Prednisone     Makes me feel weird    Tizanidine     Low bp   Tramadol Itching   Ciprofloxacin     weird feeling    Codeine Itching and Rash   Plaquenil [Hydroxychloroquine] Rash   Sulfa Antibiotics Itching and Rash     Objective:   Today's Vitals   12/27/22 1007  BP: 124/70  Pulse: 82  Temp: 97.9 F (36.6 C)  TempSrc: Temporal  SpO2: 96%  Weight: 170 lb 9.6 oz (77.4 kg)  Height: '5\' 4"'$  (1.626 m)   Body mass index is 29.28 kg/m.   General: Well developed, well nourished. No acute distress. HEENT: Normocephalic, non-traumatic. PERRL, EOMI. Conjunctiva clear. External ears normal. EAC and TMs   normal bilaterally. Nose clear without congestion or rhinorrhea. Mucous membranes moist. Oropharynx clear.   Good dentition. Neck: Supple. No lymphadenopathy. No thyromegaly. Lungs: Clear to auscultation bilaterally. No wheezing, rales or rhonchi. CV: RRR without murmurs or rubs. Pulses 2+ bilaterally. Psych: Alert and oriented. Normal mood and affect.  Health Maintenance Due  Topic Date Due   INFLUENZA VACCINE  05/30/2022   Medicare Annual Wellness (AWV)  02/02/2023   Lab Results POCT Covid: Neg. POCT Influenza A& B: Neg.     Assessment & Plan:   Problem List Items Addressed This Visit       Respiratory   Viral URI with cough - Primary    Discussed home care for viral illness, including rest, pushing fluids, and OTC medications as needed for symptom relief. Recommend hot tea with honey for sore throat symptoms. I will add hydrocodone syrup for cough. Follow-up if needed for worsening or persistent symptoms.       Relevant Medications   HYDROcodone bit-homatropine (HYCODAN) 5-1.5 MG/5ML syrup   Other Relevant Orders   POC COVID-19   POCT Influenza A/B    Return if symptoms worsen or fail to improve.   Haydee Salter, MD

## 2022-12-28 ENCOUNTER — Encounter: Payer: Self-pay | Admitting: Family Medicine

## 2022-12-29 ENCOUNTER — Encounter: Payer: Self-pay | Admitting: Family Medicine

## 2022-12-29 ENCOUNTER — Telehealth: Payer: Self-pay | Admitting: Family Medicine

## 2022-12-29 ENCOUNTER — Ambulatory Visit: Payer: Medicare PPO | Admitting: Family Medicine

## 2022-12-29 ENCOUNTER — Ambulatory Visit (INDEPENDENT_AMBULATORY_CARE_PROVIDER_SITE_OTHER): Payer: Medicare PPO

## 2022-12-29 VITALS — BP 132/60 | HR 93 | Temp 97.9°F | Resp 30 | Ht 64.0 in | Wt 171.4 lb

## 2022-12-29 DIAGNOSIS — J189 Pneumonia, unspecified organism: Secondary | ICD-10-CM | POA: Diagnosis not present

## 2022-12-29 DIAGNOSIS — R079 Chest pain, unspecified: Secondary | ICD-10-CM | POA: Diagnosis not present

## 2022-12-29 DIAGNOSIS — R509 Fever, unspecified: Secondary | ICD-10-CM | POA: Diagnosis not present

## 2022-12-29 DIAGNOSIS — R059 Cough, unspecified: Secondary | ICD-10-CM | POA: Diagnosis not present

## 2022-12-29 LAB — CBC
HCT: 35.7 % — ABNORMAL LOW (ref 36.0–46.0)
Hemoglobin: 12.1 g/dL (ref 12.0–15.0)
MCHC: 33.9 g/dL (ref 30.0–36.0)
MCV: 90 fl (ref 78.0–100.0)
Platelets: 216 10*3/uL (ref 150.0–400.0)
RBC: 3.97 Mil/uL (ref 3.87–5.11)
RDW: 13.3 % (ref 11.5–15.5)
WBC: 13.3 10*3/uL — ABNORMAL HIGH (ref 4.0–10.5)

## 2022-12-29 MED ORDER — AMOXICILLIN-POT CLAVULANATE 875-125 MG PO TABS: 1.0000 | ORAL_TABLET | Freq: Two times a day (BID) | ORAL | 0 refills | Status: DC

## 2022-12-29 MED ORDER — BENZONATATE 100 MG PO CAPS: 100.0000 mg | ORAL_CAPSULE | Freq: Three times a day (TID) | ORAL | 0 refills | Status: DC

## 2022-12-29 MED ORDER — AZITHROMYCIN 250 MG PO TABS: ORAL_TABLET | ORAL | 0 refills | Status: AC

## 2022-12-29 NOTE — Telephone Encounter (Signed)
Pharmacy told pt thast the Amoxicilian did not go through and you may need to send it electronically.

## 2022-12-29 NOTE — Progress Notes (Signed)
Called and discussed CBC and chest x-ray results. These are consistent with pneumonia. She should complete the antibiotics I prescribed earlier today.

## 2022-12-29 NOTE — Telephone Encounter (Signed)
Called pharmacy and they did get them and they have already been picked up. Dm/cma

## 2022-12-29 NOTE — Progress Notes (Signed)
[facility] [address] [place] [address] Dept: [phone] Dept Fax: [phone]  Office Visit  Subjective:    Patient ID: Shelby Cardenas, female    DOB: 1940/11/16, 82 y.o..   MRN: [id number]  Chief Complaint  Patient presents with   Cough    C/o cough getting worse, along with pain in the ribs/back.      History of Present Illness:  Patient is in today with ongoing cough and respiratory issues. I saw Ms. Brinkmeier on 2/28, in follow-up of an UC visit 3 days prior . Her symptoms did appear viral. She is noting increasing cough and now has pain with breathing in the left posterior chest. She is coughing up greenish-brown sputum. She finds the cough has been worse at night. She has felt warm, but her temperature has been normal. She is noting that it is harder to breath now.  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   Osteoarthritis of hands, bilateral 10/12/2021   Intervertebral disc disorder with radiculopathy of lumbar region 09/06/2021   Closed wedge compression fracture of first lumbar vertebra ([place]) 06/09/2021   OAB (overactive bladder) 11/12/2020   Primary open angle glaucoma of both eyes, moderate stage 12/12/2018   Mammogram abnormal 07/16/2018   Cystocele with prolapse 07/02/2018   Adenomatous colon polyp 04/11/2016   Mixed hyperlipidemia 02/06/2016   Osteopenia 02/06/2016   Diaphragmatic hernia 02/06/2016   Allergic rhinitis 02/06/2016   Past Surgical History:  Procedure Laterality Date   BRONCHIAL BIOPSY  10/27/2022    Procedure: BRONCHIAL BIOPSIES;  Surgeon: Juanito Doom, MD;  Location: [facility];  Service: Cardiopulmonary;;   BRONCHIAL NEEDLE ASPIRATION BIOPSY  10/27/2022   Procedure: BRONCHIAL NEEDLE ASPIRATION BIOPSIES;  Surgeon: Juanito Doom, MD;  Location: [facility];  Service: Cardiopulmonary;;   BRONCHIAL WASHINGS  10/27/2022   Procedure: BRONCHIAL WASHINGS;  Surgeon: Juanito Doom, MD;  Location: [facility];  Service: Cardiopulmonary;;   CESAREAN SECTION     CHOLECYSTECTOMY     EYE SURGERY     Bilateral Cataracts   HEMOSTASIS CONTROL  10/27/2022   Procedure: HEMOSTASIS CONTROL;  Surgeon: Juanito Doom, MD;  Location: [facility];  Service: Cardiopulmonary;;   KNEE SURGERY Left    NEUROPLASTY / TRANSPOSITION MEDIAN NERVE AT [facility]  05/2021   Back   SKIN BIOPSY  10/05/2022   left upper arm   SKIN CANCER EXCISION     TONSILLECTOMY     VIDEO BRONCHOSCOPY WITH ENDOBRONCHIAL ULTRASOUND N/A 10/27/2022   Procedure: VIDEO BRONCHOSCOPY WITH ENDOBRONCHIAL ULTRASOUND;  Surgeon: Juanito Doom, MD;  Location: [facility];  Service: Cardiopulmonary;  Laterality: N/A;   Family History  Problem Relation Age of Onset   Arthritis Mother    Arthritis Father    Leukemia Father    Liver disease Brother    Diabetes Brother    Breast cancer Paternal Aunt    Breast cancer Daughter    Outpatient Medications Prior to Visit  Medication Sig Dispense Refill   Alpha Lipoic Acid 200 MG CAPS Take 200 mg by mouth daily.  Ascorbic Acid (VITAMIN C PO) Take 1 tablet by mouth daily.     b complex vitamins capsule Take 1 capsule by mouth daily.     Calcium-Phosphorus-Vitamin D (CITRACAL +D3 PO) Take 1 capsule by mouth daily.     COLLAGEN PO Take 1 Scoop by mouth daily. Coffee     dextromethorphan-guaiFENesin (MUCINEX DM) 30-600 MG 12hr tablet Take 1 tablet by mouth 2 (two) times daily.     diclofenac (VOLTAREN) 75 MG EC tablet Take 75 mg by mouth 2 (two) times daily. Take one  tablet by mouth. Twice a day with food as needed for pain/inflammation.     diclofenac Sodium (VOLTAREN) 1 % GEL Apply 2 g topically at bedtime. Knee pain     Glycerin-Hypromellose-PEG 400 (DRY EYE RELIEF DROPS OP) Place 1 drop into both eyes 2 (two) times daily.     HYDROcodone bit-homatropine (HYCODAN) 5-1.5 MG/5ML syrup Take 5 mLs by mouth every 8 (eight) hours as needed for cough. 120 mL 0   latanoprost (XALATAN) 0.005 % ophthalmic solution Place 1 drop into both eyes at bedtime.     Multiple Vitamin (MULTIVITAMIN ADULT PO) Take 1 tablet by mouth daily. Balance of nature     promethazine (PHENERGAN) 25 MG tablet Take 25 mg by mouth every 6 (six) hours as needed for nausea or vomiting.     Turmeric Curcumin 500 MG CAPS Take 500 mg by mouth daily.     benzonatate (TESSALON) 100 MG capsule Take 1 capsule (100 mg total) by mouth every 8 (eight) hours. 21 capsule 0   No facility-administered medications prior to visit.   Allergies  Allergen Reactions   Macrobid [Nitrofurantoin] Rash   Boniva [Ibandronic Acid] Other (See Comments)    Muscle aches /headache   Gabapentin     Dizzy and fell when taking     Prednisone     Makes me feel weird    Tizanidine     Low bp   Tramadol Itching   Ciprofloxacin     weird feeling    Codeine Itching and Rash   Plaquenil [Hydroxychloroquine] Rash   Sulfa Antibiotics Itching and Rash     Objective:   Today's Vitals   12/29/22 1058  BP: 132/60  Pulse: 93  Resp: (!) 30  Temp: 97.9 F (36.6 C)  TempSrc: Temporal  SpO2: 95%  Weight: 171 lb 6.4 oz (77.7 kg)  Height: '5\' 4"'$  (1.626 m)   Body mass index is 29.42 kg/m.   General: Well developed, well nourished. Appears ill and has mild respiratory distress. Lungs: There are ronchi in the left lower chest with mild rales int he right base.  Psych: Alert and oriented. Normal mood and affect.  Health Maintenance Due  Topic Date Due   INFLUENZA VACCINE  05/30/2022   Medicare Annual Wellness  (AWV)  02/02/2023   Assessment & Plan:   Problem List Items Addressed This Visit       Respiratory   Pneumonia of left lower lobe due to infectious organism - Primary    Clinically, Ms. Sauders appears to have pneumonia. I will send her for a CXR and check a CBC today. I will plan to go ahead and treat her with Augmentin and azithromycin. I will see her back in 1 week. She shodul seek emergent evaluation if her illness worsens over the weekend.      Relevant Medications   promethazine (PHENERGAN) 25 MG tablet   amoxicillin-clavulanate (AUGMENTIN) 875-125 MG tablet   azithromycin (  ZITHROMAX) 250 MG tablet   benzonatate (TESSALON) 100 MG capsule   Other Relevant Orders   DG Chest 2 View   CBC    Return in about 1 week (around 01/05/2023) for Reassessment.   Haydee Salter, MD

## 2022-12-29 NOTE — Assessment & Plan Note (Signed)
Clinically, Shelby Cardenas appears to have pneumonia. I will send her for a CXR and check a CBC today. I will plan to go ahead and treat her with Augmentin and azithromycin. I will see her back in 1 week. She shodul seek emergent evaluation if her illness worsens over the weekend.

## 2023-01-04 ENCOUNTER — Encounter: Payer: Self-pay | Admitting: Family Medicine

## 2023-01-04 ENCOUNTER — Ambulatory Visit: Payer: Medicare PPO | Admitting: Family Medicine

## 2023-01-04 VITALS — BP 120/66 | HR 75 | Temp 97.7°F | Ht 64.0 in | Wt 169.8 lb

## 2023-01-04 DIAGNOSIS — J189 Pneumonia, unspecified organism: Secondary | ICD-10-CM | POA: Diagnosis not present

## 2023-01-04 NOTE — Progress Notes (Signed)
[facility] [address] [place] [address] Dept: [phone] Dept Fax: [phone]  Office Visit  Subjective:    Patient ID: Shelby Cardenas, female    DOB: Nov 28, 1940, 82 y.o..   MRN: [id number]  Chief Complaint  Patient presents with   Medical Management of Chronic Issues    1 week f/u.  C/o still having pain on the LT side but cough is better.    History of Present Illness:  Patient is in today for for reassessment of her pneumonia. I had seen her on 2/28 and 3/1 with ongoing cough and respiratory issues. She had initially presented with what appeared to be a viral illness. She had developed increasing cough and pain with breathing in the left posterior chest. She  was coughing up greenish-brown sputum. She was noting it was harder to breath. Her chest exam was consistent with a left lower lobe pneumonia. I treated her with a course of Augmentin and azithromycin.  Ms. Hedin is still having some cough, but is overall feeling better than last week. She still ahs a few days of antibiotics to finish.   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   Osteoarthritis of hands, bilateral 10/12/2021   Intervertebral disc disorder with radiculopathy of lumbar region 09/06/2021   Closed wedge compression fracture of first lumbar vertebra ([place]) 06/09/2021   OAB (overactive bladder) 11/12/2020   Primary open angle glaucoma of both eyes, moderate stage 12/12/2018   Mammogram abnormal  07/16/2018   Cystocele with prolapse 07/02/2018   Adenomatous colon polyp 04/11/2016   Mixed hyperlipidemia 02/06/2016   Osteopenia 02/06/2016   Diaphragmatic hernia 02/06/2016   Allergic rhinitis 02/06/2016   Past Surgical History:  Procedure Laterality Date   BRONCHIAL BIOPSY  10/27/2022   Procedure: BRONCHIAL BIOPSIES;  Surgeon: Juanito Doom, MD;  Location: [facility];  Service: Cardiopulmonary;;   BRONCHIAL NEEDLE ASPIRATION BIOPSY  10/27/2022   Procedure: BRONCHIAL NEEDLE ASPIRATION BIOPSIES;  Surgeon: Juanito Doom, MD;  Location: [facility];  Service: Cardiopulmonary;;   BRONCHIAL WASHINGS  10/27/2022   Procedure: BRONCHIAL WASHINGS;  Surgeon: Juanito Doom, MD;  Location: [facility];  Service: Cardiopulmonary;;   CESAREAN SECTION     CHOLECYSTECTOMY     EYE SURGERY     Bilateral Cataracts   HEMOSTASIS CONTROL  10/27/2022   Procedure: HEMOSTASIS CONTROL;  Surgeon: Juanito Doom, MD;  Location: [facility];  Service: Cardiopulmonary;;   KNEE SURGERY Left    NEUROPLASTY / TRANSPOSITION MEDIAN NERVE AT [facility]  05/2021   Back   SKIN BIOPSY  10/05/2022   left upper arm   SKIN CANCER EXCISION     TONSILLECTOMY     VIDEO BRONCHOSCOPY WITH ENDOBRONCHIAL ULTRASOUND N/A 10/27/2022   Procedure: VIDEO BRONCHOSCOPY WITH ENDOBRONCHIAL ULTRASOUND;  Surgeon: Juanito Doom, MD;  Location: [facility];  Service: Cardiopulmonary;  Laterality: N/A;   Family History  Problem Relation Age of Onset   Arthritis Mother    Arthritis Father    Leukemia Father    Liver  disease Brother    Diabetes Brother    Breast cancer Paternal Aunt    Breast cancer Daughter    Outpatient Medications Prior to Visit  Medication Sig Dispense Refill   Alpha Lipoic Acid 200 MG CAPS Take 200 mg by mouth daily.     amoxicillin-clavulanate (AUGMENTIN) 875-125 MG tablet Take 1 tablet by mouth 2 (two) times daily. 20 tablet 0   Ascorbic Acid (VITAMIN C PO) Take 1 tablet by  mouth daily.     b complex vitamins capsule Take 1 capsule by mouth daily.     benzonatate (TESSALON) 100 MG capsule Take 1 capsule (100 mg total) by mouth every 8 (eight) hours. 21 capsule 0   Calcium-Phosphorus-Vitamin D (CITRACAL +D3 PO) Take 1 capsule by mouth daily.     COLLAGEN PO Take 1 Scoop by mouth daily. Coffee     dextromethorphan-guaiFENesin (MUCINEX DM) 30-600 MG 12hr tablet Take 1 tablet by mouth 2 (two) times daily.     diclofenac (VOLTAREN) 75 MG EC tablet Take 75 mg by mouth 2 (two) times daily. Take one tablet by mouth. Twice a day with food as needed for pain/inflammation.     diclofenac Sodium (VOLTAREN) 1 % GEL Apply 2 g topically at bedtime. Knee pain     Glycerin-Hypromellose-PEG 400 (DRY EYE RELIEF DROPS OP) Place 1 drop into both eyes 2 (two) times daily.     HYDROcodone bit-homatropine (HYCODAN) 5-1.5 MG/5ML syrup Take 5 mLs by mouth every 8 (eight) hours as needed for cough. 120 mL 0   latanoprost (XALATAN) 0.005 % ophthalmic solution Place 1 drop into both eyes at bedtime.     Multiple Vitamin (MULTIVITAMIN ADULT PO) Take 1 tablet by mouth daily. Balance of nature     promethazine (PHENERGAN) 25 MG tablet Take 25 mg by mouth every 6 (six) hours as needed for nausea or vomiting.     Turmeric Curcumin 500 MG CAPS Take 500 mg by mouth daily.     No facility-administered medications prior to visit.   Allergies  Allergen Reactions   Macrobid [Nitrofurantoin] Rash   Boniva [Ibandronic Acid] Other (See Comments)    Muscle aches /headache   Gabapentin     Dizzy and fell when taking     Prednisone     Makes me feel weird    Tizanidine     Low bp   Tramadol Itching   Ciprofloxacin     weird feeling    Codeine Itching and Rash   Plaquenil [Hydroxychloroquine] Rash   Sulfa Antibiotics Itching and Rash     Objective:   Today's Vitals   01/04/23 1009  BP: 120/66  Pulse: 75  Temp: 97.7 F (36.5 C)  TempSrc: Temporal  SpO2: 95%  Weight: 169 lb 12.8 oz (77  kg)  Height: '5\' 4"'$  (1.626 m)   Body mass index is 29.15 kg/m.   General: Well developed, well nourished. No acute distress. Lungs: Clear to auscultation bilaterally. No wheezing, rales or rhonchi. Psych: Alert and oriented. Normal mood and affect.  Health Maintenance Due  Topic Date Due   Medicare Annual Wellness (AWV)  02/02/2023   Lab Results    Latest Ref Rng & Units 12/29/2022   11:45 AM 10/27/2022    6:09 AM 07/31/2022    2:27 PM  CBC  WBC 4.0 - 10.5 K/uL 13.3  9.0  12.0   Hemoglobin 12.0 - 15.0 g/dL 12.1  13.5  11.2   Hematocrit 36.0 - 46.0 % 35.7  40.0  33.6   Platelets 150.0 - 400.0 K/uL 216.0  257  458.0    Imaging: Chest x-ray (12/29/2022) IMPRESSION: Nonspecific interstitial prominence consistent with atypical infection, edema or interstitial lung disease. No focal consolidation.     Assessment & Plan:   Problem List Items Addressed This Visit       Respiratory   Pneumonia of left lower lobe due to infectious organism - Primary    Return in about 4 weeks (around 02/01/2023) for Reassessment.   Haydee Salter, MD

## 2023-01-30 ENCOUNTER — Telehealth: Payer: Self-pay | Admitting: Family Medicine

## 2023-01-30 NOTE — Telephone Encounter (Signed)
Contacted [facility] to schedule their annual wellness visit. Appointment made for 02/05/23.  Shirlean Mylar [phone]

## 2023-02-03 IMAGING — DX DG FEMUR 2+V*R*
4 series · 4 of 4 positions shown · non-contrast
Comparison: None Available.

CLINICAL DATA: Right leg pain for 2 days.  No injury.

EXAM:
RIGHT FEMUR 2 VIEWS

[femur ap]
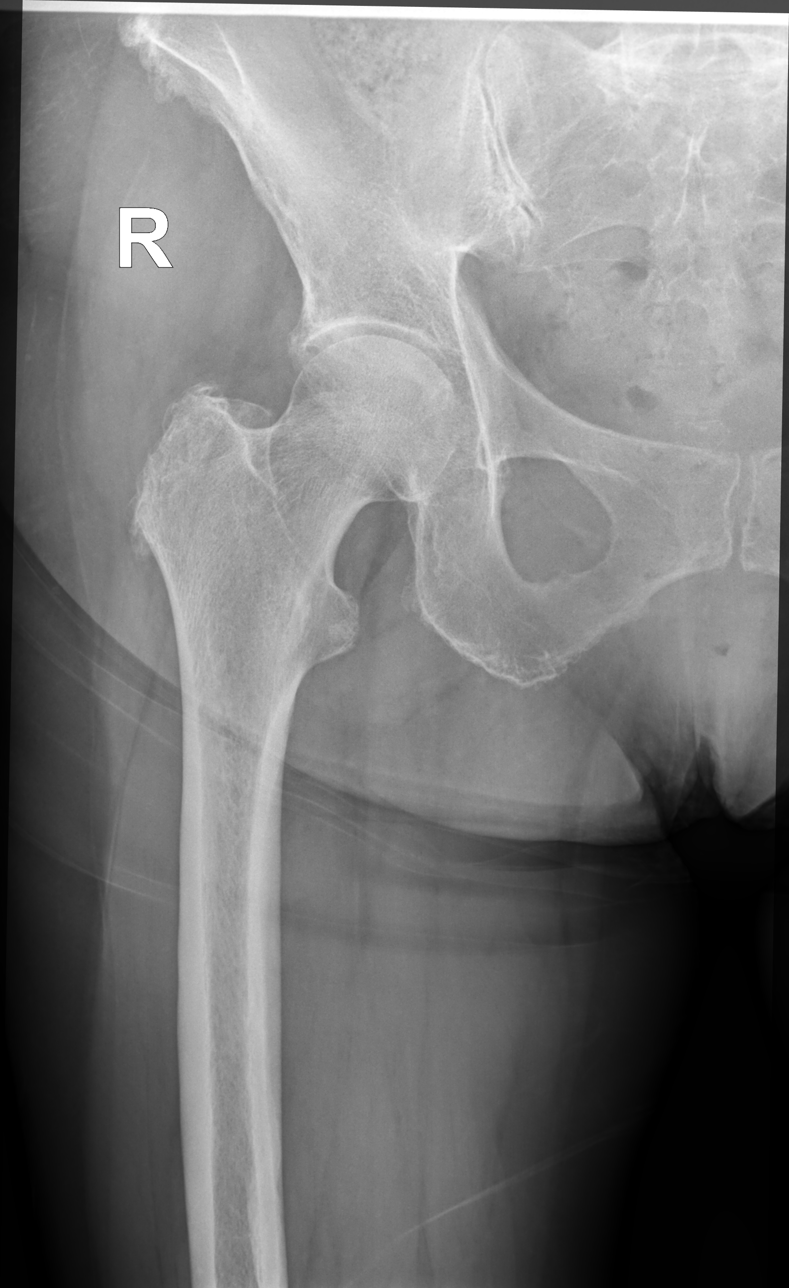

[knee ap]
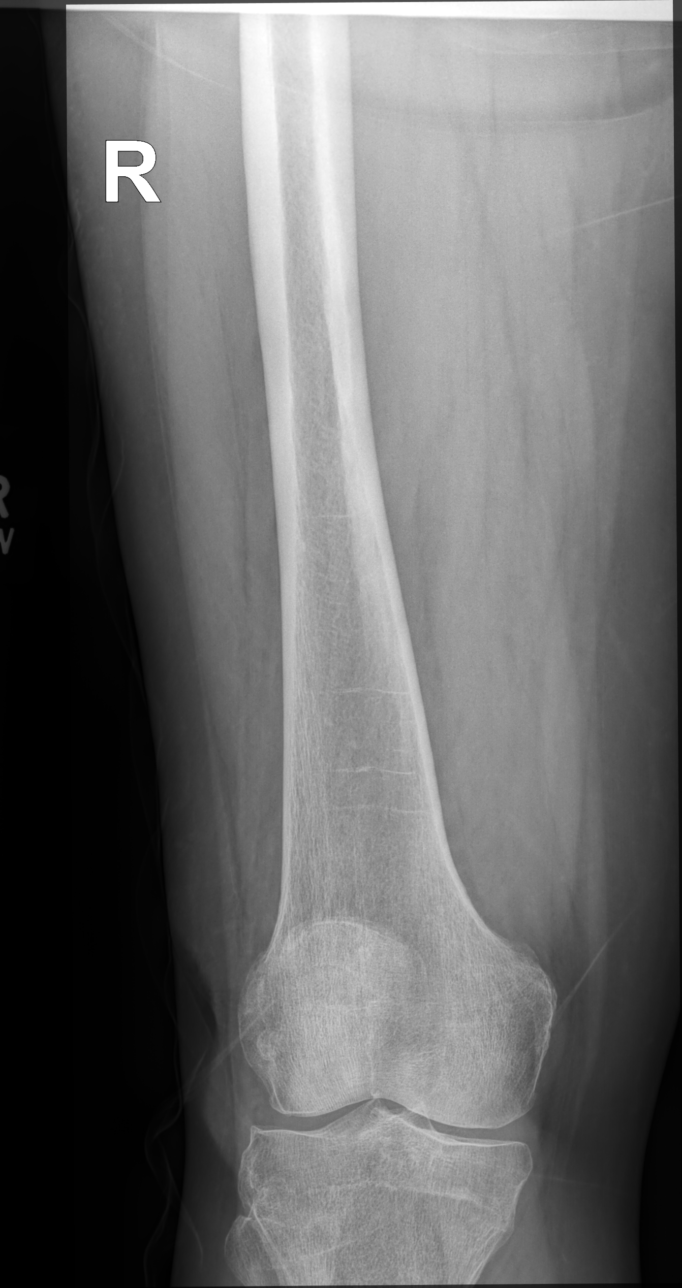

[femur lat]
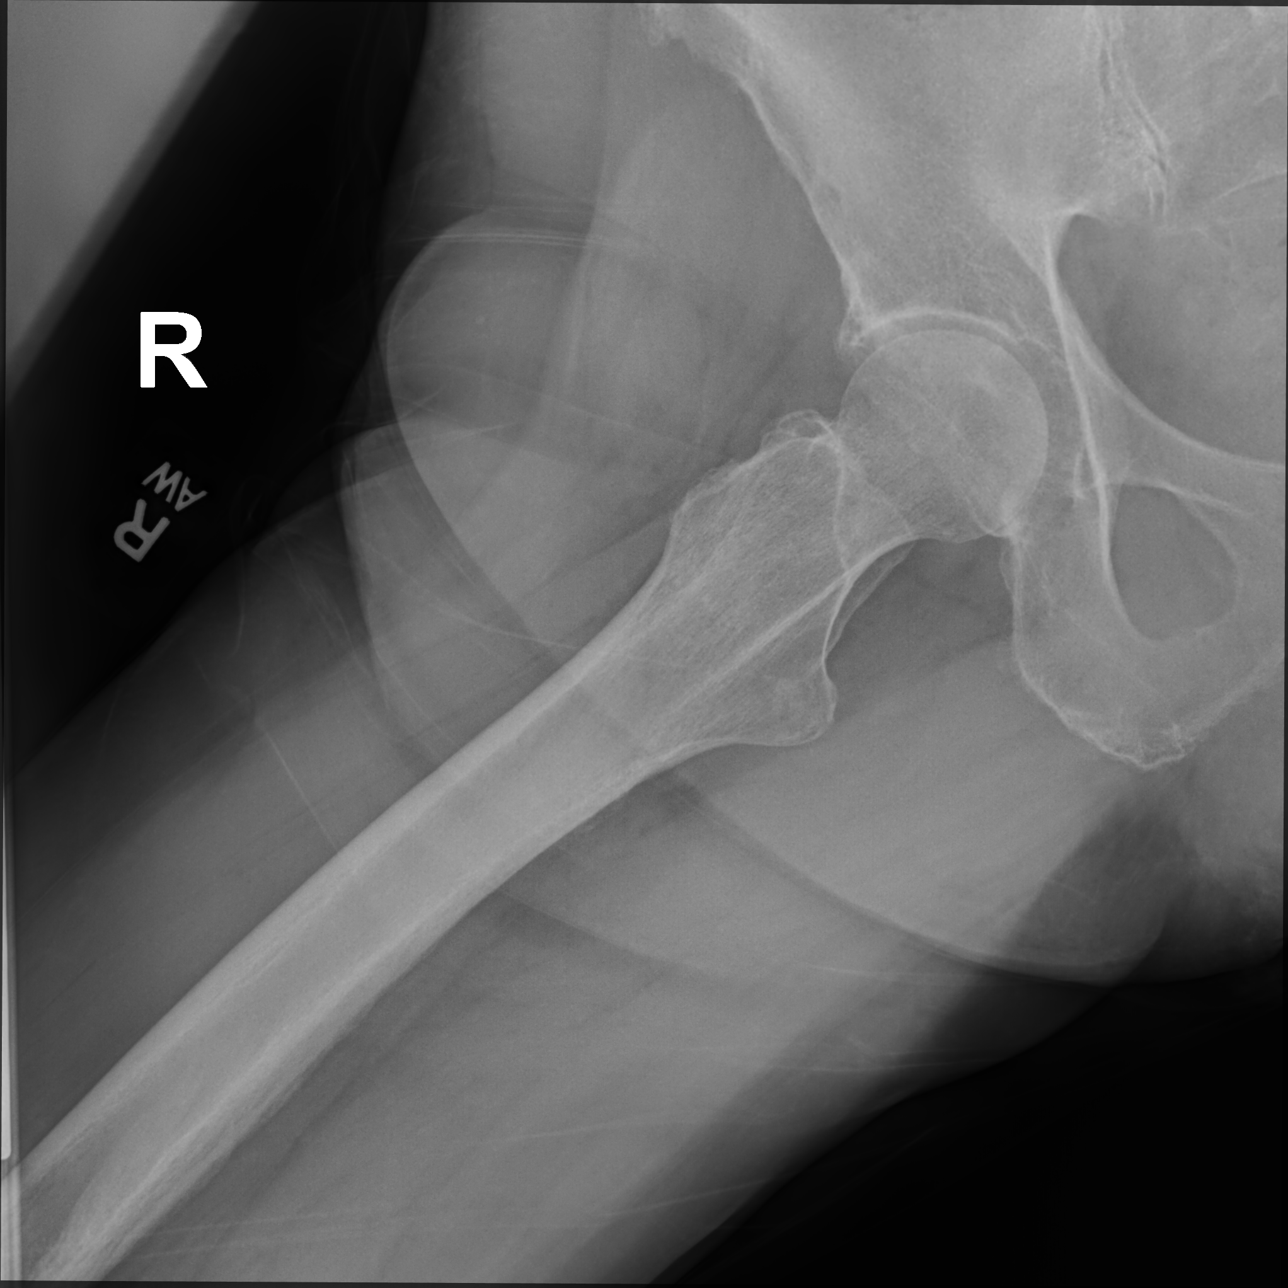

[knee lat]
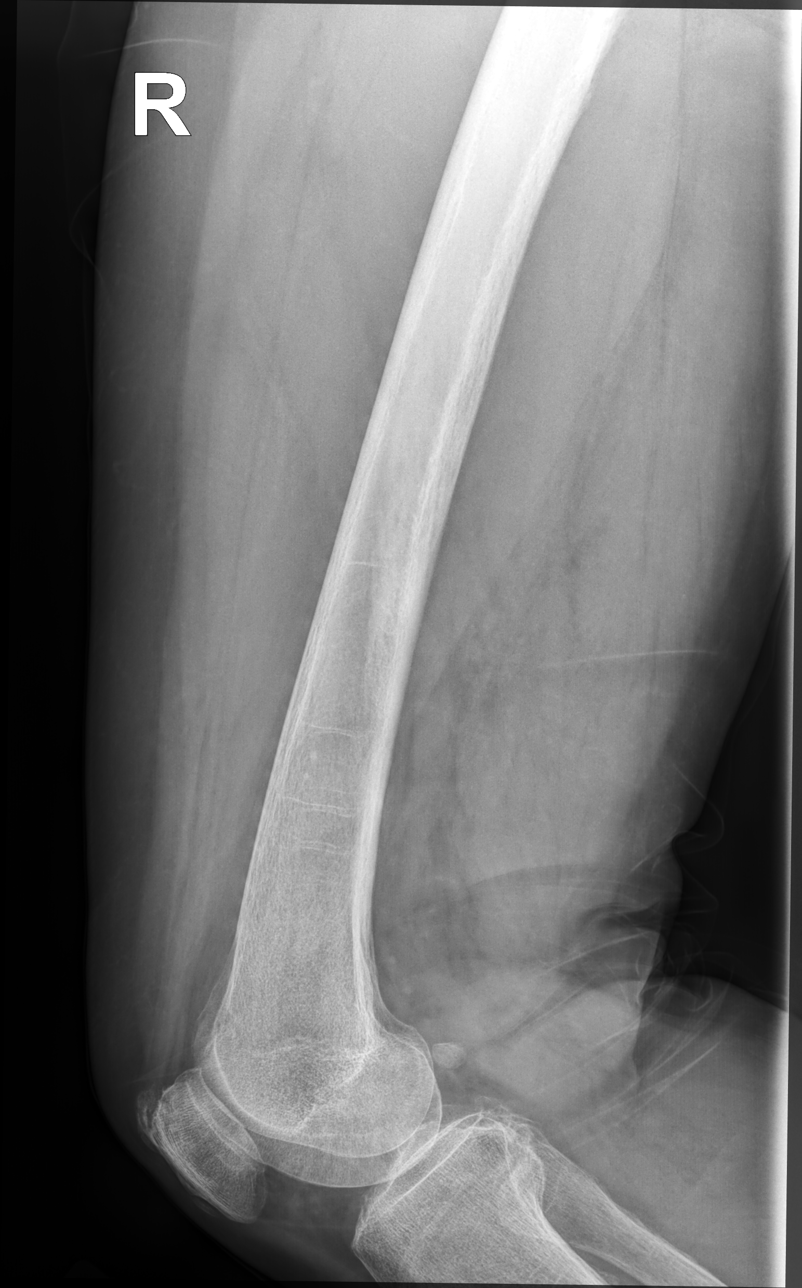

[4 of 4 positions shown; findings below may reference images not displayed]

FINDINGS: Enthesopathic changes off the superior patella. No fractures. No
bone lesions. Mild degenerative changes of the right hip without
loss of joint space. Enthesopathic changes at the greater
trochanter. Mild degenerative changes in the lateral compartment of
the knee.
IMPRESSION: 1. Enthesopathic and degenerative changes associated with the knee
and hip as above.
2. No fracture, dislocation, or bony lesion.

## 2023-02-05 ENCOUNTER — Ambulatory Visit (INDEPENDENT_AMBULATORY_CARE_PROVIDER_SITE_OTHER): Payer: Medicare PPO

## 2023-02-05 VITALS — BP 146/70 | HR 91 | Temp 98.4°F | Ht 64.0 in | Wt 167.8 lb

## 2023-02-05 DIAGNOSIS — Z0000 Encounter for general adult medical examination without abnormal findings: Secondary | ICD-10-CM | POA: Diagnosis not present

## 2023-02-05 NOTE — Patient Instructions (Signed)
Shelby Cardenas , Thank you for taking time to come for your Medicare Wellness Visit. I appreciate your ongoing commitment to your health goals. Please review the following plan we discussed and let me know if I can assist you in the future.   These are the goals we discussed:  Goals      Patient Stated     Get rid of extra abdominal fat & continue healthy eating     Patient Stated     02/05/2023, wants to get better        This is a list of the screening recommended for you and due dates:  Health Maintenance  Topic Date Due   COVID-19 Vaccine (3 - Pfizer risk series) 01/03/2020   Flu Shot  05/31/2023   Medicare Annual Wellness Visit  02/05/2024   DTaP/Tdap/Td vaccine (3 - Td or Tdap) 01/14/2032   Pneumonia Vaccine  Completed   DEXA scan (bone density measurement)  Completed   Zoster (Shingles) Vaccine  Completed   HPV Vaccine  Aged Out    Advanced directives: Please bring a copy of your POA (Power of [place]) and/or Living Will to your next appointment.   Conditions/risks identified: none  Next appointment: Follow up in one year for your annual wellness visit    Preventive Care 65 Years and Older, Female Preventive care refers to lifestyle choices and visits with your health care provider that can promote health and wellness. What does preventive care include? A yearly physical exam. This is also called an annual well check. Dental exams once or twice a year. Routine eye exams. Ask your health care provider how often you should have your eyes checked. Personal lifestyle choices, including: Daily care of your teeth and gums. Regular physical activity. Eating a healthy diet. Avoiding tobacco and drug use. Limiting alcohol use. Practicing safe sex. Taking low-dose aspirin every day. Taking vitamin and mineral supplements as recommended by your health care provider. What happens during an annual well check? The services and screenings done by your health care provider  during your annual well check will depend on your age, overall health, lifestyle risk factors, and family history of disease. Counseling  Your health care provider may ask you questions about your: Alcohol use. Tobacco use. Drug use. Emotional well-being. Home and relationship well-being. Sexual activity. Eating habits. History of falls. Memory and ability to understand (cognition). Work and work Astronomer. Reproductive health. Screening  You may have the following tests or measurements: Height, weight, and BMI. Blood pressure. Lipid and cholesterol levels. These may be checked every 5 years, or more frequently if you are over 74 years old. Skin check. Lung cancer screening. You may have this screening every year starting at age 41 if you have a 30-pack-year history of smoking and currently smoke or have quit within the past 15 years. Fecal occult blood test (FOBT) of the stool. You may have this test every year starting at age 52. Flexible sigmoidoscopy or colonoscopy. You may have a sigmoidoscopy every 5 years or a colonoscopy every 10 years starting at age 2. Hepatitis C blood test. Hepatitis B blood test. Sexually transmitted disease (STD) testing. Diabetes screening. This is done by checking your blood sugar (glucose) after you have not eaten for a while (fasting). You may have this done every 1-3 years. Bone density scan. This is done to screen for osteoporosis. You may have this done starting at age 55. Mammogram. This may be done every 1-2 years. Talk to your health  care provider about how often you should have regular mammograms. Talk with your health care provider about your test results, treatment options, and if necessary, the need for more tests. Vaccines  Your health care provider may recommend certain vaccines, such as: Influenza vaccine. This is recommended every year. Tetanus, diphtheria, and acellular pertussis (Tdap, Td) vaccine. You may need a Td booster every  10 years. Zoster vaccine. You may need this after age 19. Pneumococcal 13-valent conjugate (PCV13) vaccine. One dose is recommended after age 46. Pneumococcal polysaccharide (PPSV23) vaccine. One dose is recommended after age 3. Talk to your health care provider about which screenings and vaccines you need and how often you need them. This information is not intended to replace advice given to you by your health care provider. Make sure you discuss any questions you have with your health care provider. Document Released: 11/12/2015 Document Revised: 07/05/2016 Document Reviewed: 08/17/2015 Elsevier Interactive Patient Education  2017 [place] Prevention in the Home Falls can cause injuries. They can happen to people of all ages. There are many things you can do to make your home safe and to help prevent falls. What can I do on the outside of my home? Regularly fix the edges of walkways and driveways and fix any cracks. Remove anything that might make you trip as you walk through a door, such as a raised step or threshold. Trim any bushes or trees on the path to your home. Use bright outdoor lighting. Clear any walking paths of anything that might make someone trip, such as rocks or tools. Regularly check to see if handrails are loose or broken. Make sure that both sides of any steps have handrails. Any raised decks and porches should have guardrails on the edges. Have any leaves, snow, or ice cleared regularly. Use sand or salt on walking paths during winter. Clean up any spills in your garage right away. This includes oil or grease spills. What can I do in the bathroom? Use night lights. Install grab bars by the toilet and in the tub and shower. Do not use towel bars as grab bars. Use non-skid mats or decals in the tub or shower. If you need to sit down in the shower, use a plastic, non-slip stool. Keep the floor dry. Clean up any water that spills on the floor as soon as it  happens. Remove soap buildup in the tub or shower regularly. Attach bath mats securely with double-sided non-slip rug tape. Do not have throw rugs and other things on the floor that can make you trip. What can I do in the bedroom? Use night lights. Make sure that you have a light by your bed that is easy to reach. Do not use any sheets or blankets that are too big for your bed. They should not hang down onto the floor. Have a firm chair that has side arms. You can use this for support while you get dressed. Do not have throw rugs and other things on the floor that can make you trip. What can I do in the kitchen? Clean up any spills right away. Avoid walking on wet floors. Keep items that you use a lot in easy-to-reach places. If you need to reach something above you, use a strong step stool that has a grab bar. Keep electrical cords out of the way. Do not use floor polish or wax that makes floors slippery. If you must use wax, use non-skid floor wax. Do not have throw  rugs and other things on the floor that can make you trip. What can I do with my stairs? Do not leave any items on the stairs. Make sure that there are handrails on both sides of the stairs and use them. Fix handrails that are broken or loose. Make sure that handrails are as long as the stairways. Check any carpeting to make sure that it is firmly attached to the stairs. Fix any carpet that is loose or worn. Avoid having throw rugs at the top or bottom of the stairs. If you do have throw rugs, attach them to the floor with carpet tape. Make sure that you have a light switch at the top of the stairs and the bottom of the stairs. If you do not have them, ask someone to add them for you. What else can I do to help prevent falls? Wear shoes that: Do not have high heels. Have rubber bottoms. Are comfortable and fit you well. Are closed at the toe. Do not wear sandals. If you use a stepladder: Make sure that it is fully opened.  Do not climb a closed stepladder. Make sure that both sides of the stepladder are locked into place. Ask someone to hold it for you, if possible. Clearly mark and make sure that you can see: Any grab bars or handrails. First and last steps. Where the edge of each step is. Use tools that help you move around (mobility aids) if they are needed. These include: Canes. Walkers. Scooters. Crutches. Turn on the lights when you go into a dark area. Replace any light bulbs as soon as they burn out. Set up your furniture so you have a clear path. Avoid moving your furniture around. If any of your floors are uneven, fix them. If there are any pets around you, be aware of where they are. Review your medicines with your doctor. Some medicines can make you feel dizzy. This can increase your chance of falling. Ask your doctor what other things that you can do to help prevent falls. This information is not intended to replace advice given to you by your health care provider. Make sure you discuss any questions you have with your health care provider. Document Released: 08/12/2009 Document Revised: 03/23/2016 Document Reviewed: 11/20/2014 Elsevier Interactive Patient Education  2017 [employer].

## 2023-02-05 NOTE — Progress Notes (Signed)
Subjective:   Shelby Cardenas is a 82 y.o. female who presents for Medicare Annual (Subsequent) preventive examination.  Review of Systems     Cardiac Risk Factors include: advanced age (>27men, >103 women);dyslipidemia     Objective:    Today's Vitals   02/05/23 1040 02/05/23 1044 02/05/23 1104  BP: (!) 150/80  (!) 146/70  Pulse: 91    Temp: 98.4 F (36.9 C)    TempSrc: Oral    SpO2: 94%    Weight: 167 lb 12.8 oz (76.1 kg)    Height: 5\' 4"  (1.626 m)    PainSc:  3     Body mass index is 28.8 kg/m.     02/05/2023   10:51 AM 10/27/2022    6:22 AM 10/25/2022    4:31 PM 02/01/2022   10:35 AM 01/25/2021   10:59 AM  Advanced Directives  Does Patient Have a Medical Advance Directive? Yes Yes Yes Yes Yes  Type of Estate agent of [place];Living will Healthcare Power of [place];Living will Healthcare Power of [place];Living will Healthcare Power of [place];Living will Healthcare Power of [place];Living will  Does patient want to make changes to medical advance directive?   No - Patient declined    Copy of Healthcare Power of Attorney in Chart? No - copy requested No - copy requested No - copy requested No - copy requested No - copy requested    Current Medications (verified) Outpatient Encounter Medications as of 02/05/2023  Medication Sig   Alpha Lipoic Acid 200 MG CAPS Take 200 mg by mouth daily.   Ascorbic Acid (VITAMIN C PO) Take 1 tablet by mouth daily.   b complex vitamins capsule Take 1 capsule by mouth daily.   Calcium-Phosphorus-Vitamin D (CITRACAL +D3 PO) Take 1 capsule by mouth daily.   COLLAGEN PO Take 1 Scoop by mouth daily. Coffee   diclofenac Sodium (VOLTAREN) 1 % GEL Apply 2 g topically at bedtime. Knee pain   Glycerin-Hypromellose-PEG 400 (DRY EYE RELIEF DROPS OP) Place 1 drop into both eyes 2 (two) times daily.   latanoprost (XALATAN) 0.005 % ophthalmic solution Place 1 drop into both eyes at bedtime.   Multiple Vitamin (MULTIVITAMIN  ADULT PO) Take 1 tablet by mouth daily. Balance of nature   Turmeric Curcumin 500 MG CAPS Take 500 mg by mouth daily.   amoxicillin-clavulanate (AUGMENTIN) 875-125 MG tablet Take 1 tablet by mouth 2 (two) times daily. (Patient not taking: Reported on 02/05/2023)   benzonatate (TESSALON) 100 MG capsule Take 1 capsule (100 mg total) by mouth every 8 (eight) hours. (Patient not taking: Reported on 02/05/2023)   dextromethorphan-guaiFENesin (MUCINEX DM) 30-600 MG 12hr tablet Take 1 tablet by mouth 2 (two) times daily. (Patient not taking: Reported on 02/05/2023)   diclofenac (VOLTAREN) 75 MG EC tablet Take 75 mg by mouth 2 (two) times daily. Take one tablet by mouth. Twice a day with food as needed for pain/inflammation. (Patient not taking: Reported on 02/05/2023)   HYDROcodone bit-homatropine (HYCODAN) 5-1.5 MG/5ML syrup Take 5 mLs by mouth every 8 (eight) hours as needed for cough. (Patient not taking: Reported on 02/05/2023)   promethazine (PHENERGAN) 25 MG tablet Take 25 mg by mouth every 6 (six) hours as needed for nausea or vomiting. (Patient not taking: Reported on 02/05/2023)   No facility-administered encounter medications on file as of 02/05/2023.    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 2014: Fracture fibula 2017: -1.8 2019: -1.4 2022: L1 compression fracture   Positive ANA (antinuclear antibody) 10/17/2021   09-2021: 1:40, speckled pattern   Primary open angle glaucoma of both eyes, moderate stage 12/12/2018   Past Surgical History:  Procedure Laterality Date   BRONCHIAL BIOPSY  10/27/2022   Procedure: BRONCHIAL BIOPSIES;  Surgeon: Lupita LeashMcQuaid, Douglas B, MD;  Location: [facility];  Service: Cardiopulmonary;;   BRONCHIAL NEEDLE ASPIRATION BIOPSY  10/27/2022   Procedure: BRONCHIAL NEEDLE ASPIRATION BIOPSIES;  Surgeon: Lupita LeashMcQuaid, Douglas B, MD;  Location: [facility];  Service: Cardiopulmonary;;   BRONCHIAL WASHINGS  10/27/2022   Procedure: BRONCHIAL WASHINGS;  Surgeon: Lupita LeashMcQuaid, Douglas B, MD;  Location: [facility];  Service: Cardiopulmonary;;   CESAREAN SECTION     CHOLECYSTECTOMY     EYE SURGERY     Bilateral Cataracts   HEMOSTASIS CONTROL  10/27/2022   Procedure: HEMOSTASIS CONTROL;  Surgeon: Lupita LeashMcQuaid, Douglas B, MD;  Location: [facility];  Service: Cardiopulmonary;;   KNEE SURGERY Left    NEUROPLASTY / TRANSPOSITION MEDIAN NERVE AT CARPAL TUNNEL  05/2021   Back   SKIN BIOPSY  10/05/2022   left upper arm   SKIN CANCER EXCISION     TONSILLECTOMY     VIDEO BRONCHOSCOPY WITH ENDOBRONCHIAL ULTRASOUND N/A 10/27/2022   Procedure: VIDEO BRONCHOSCOPY WITH ENDOBRONCHIAL ULTRASOUND;  Surgeon: Lupita LeashMcQuaid, Douglas B, MD;  Location: [facility];  Service: Cardiopulmonary;  Laterality: N/A;   Family History  Problem Relation Age of Onset   Arthritis Mother    Arthritis Father    Leukemia Father    Liver disease Brother    Diabetes Brother    Breast cancer Paternal Aunt    Breast cancer Daughter    Social History   Socioeconomic History    Marital status: Married    Spouse name: Not on file   Number of children: Not on file   Years of education: Not on file   Highest education level: Not on file  Occupational History   Not on file  Tobacco Use   Smoking status: Never    Passive exposure: Past   Smokeless tobacco: Never  Vaping Use   Vaping Use: Never used  Substance and Sexual Activity   Alcohol use: Not Currently   Drug use: Never   Sexual activity: Yes  Other Topics Concern   Not on file  Social History Narrative   Not on file   Social Determinants of Health   Financial Resource Strain: Low Risk  (02/05/2023)   Overall Financial Resource Strain (CARDIA)    Difficulty of Paying Living Expenses: Not hard at all  Food Insecurity: No Food Insecurity (02/05/2023)   Hunger Vital Sign    Worried About Running Out of Food in the Last Year:  Never true    Ran Out of Food in the Last Year: Never true  Transportation Needs: No Transportation Needs (02/05/2023)   PRAPARE - Administrator, Civil Service (Medical): No    Lack of Transportation (Non-Medical): No  Physical Activity: Inactive (02/05/2023)   Exercise Vital Sign    Days of Exercise per Week: 0 days    Minutes of Exercise per Session: 0 min  Stress: No Stress Concern Present (02/05/2023)   [employer] of Occupational Health - Occupational Stress Questionnaire    Feeling of Stress : Not at all  Social Connections: Socially Integrated (02/01/2022)   Social Connection and Isolation Panel [NHANES]    Frequency of Communication with Friends and Family: Twice a week    Frequency of Social Gatherings with Friends and Family: Twice a week    Attends Religious Services: More than 4 times per year    Active Member of [employer] or Organizations: Yes    Attends Banker Meetings: 1 to 4 times per year    Marital Status: Married    Tobacco Counseling Counseling given: Not Answered   Clinical Intake:  Pre-visit preparation completed:  Yes  Pain : 0-10 Pain Score: 3  Pain Type: Chronic pain Pain Location: Knee Pain Orientation: Left, Right Pain Descriptors / Indicators: Aching Pain Onset: More than a month ago Pain Frequency: Constant     Nutritional Status: BMI 25 -29 Overweight Nutritional Risks: None Diabetes: No  How often do you need to have someone help you when you read instructions, pamphlets, or other written materials from your doctor or pharmacy?: 1 - Never  Diabetic? no  Interpreter Needed?: No  Information entered by :: NAllen LPN   Activities of Daily Living    02/05/2023   10:53 AM  In your present state of health, do you have any difficulty performing the following activities:  Hearing? 0  Vision? 0  Difficulty concentrating or making decisions? 0  Walking or climbing stairs? 0  Dressing or bathing? 0  Doing errands, shopping? 0  Preparing Food and eating ? N  Using the Toilet? N  In the past six months, have you accidently leaked urine? Y  Do you have problems with loss of bowel control? N  Managing your Medications? N  Managing your Finances? N  Housekeeping or managing your Housekeeping? N    Patient Care Team: Loyola Mast, MD as PCP - General (Family Medicine) Letta Kocher, MD (Rehabilitation) Adelfa Koh, MD as Referring Physician (Orthopedic Surgery) Shamleffer, Konrad Dolores, MD as Attending Physician (Endocrinology) Baldo Daub, MD as Consulting Physician (Cardiology) Fuller Plan, MD as Consulting Physician (Rheumatology)  Indicate any recent Medical Services you may have received from other than [facility] providers in the past year (date may be approximate).     Assessment:   This is a routine wellness examination for Sharona.  Hearing/Vision screen Vision Screening - Comments:: Regular eye exams, [facility]  Dietary issues and exercise activities discussed: Current Exercise Habits: The patient does not participate in  regular exercise at present   Goals Addressed             This Visit's Progress    Patient Stated       02/05/2023, wants to get better       Depression Screen    02/05/2023   10:53 AM 02/01/2022   10:36 AM 02/01/2022   10:33 AM 01/25/2021   11:00 AM 11/12/2020  1:12 PM  PHQ 2/9 Scores  PHQ - 2 Score 0 0 0 0 0    Fall Risk    02/05/2023   10:52 AM 02/01/2022   10:36 AM 12/02/2021    8:57 AM 04/06/2021    1:24 PM 01/25/2021   11:00 AM  Fall Risk   Falls in the past year? 0 0 1 0 0  Number falls in past yr: 0 0 0 0 0  Injury with Fall? 0 0 1 0 0  Risk for fall due to : Medication side effect  History of fall(s)    Follow up Falls prevention discussed;Education provided;Falls evaluation completed Falls evaluation completed Falls evaluation completed  Falls prevention discussed    FALL RISK PREVENTION PERTAINING TO THE HOME:  Any stairs in or around the home? Yes  If so, are there any without handrails? No  Home free of loose throw rugs in walkways, pet beds, electrical cords, etc? Yes  Adequate lighting in your home to reduce risk of falls? Yes   ASSISTIVE DEVICES UTILIZED TO PREVENT FALLS:  Life alert? No  Use of a cane, walker or w/c? No  Grab bars in the bathroom? Yes  Shower chair or bench in shower? No  Elevated toilet seat or a handicapped toilet? Yes   TIMED UP AND GO:  Was the test performed? Yes .  Length of time to ambulate 10 feet: 5 sec.   Gait slow and steady without use of assistive device  Cognitive Function:        02/05/2023   10:56 AM  6CIT Screen  What Year? 0 points  What month? 0 points  What time? 0 points  Count back from 20 0 points  Months in reverse 0 points  Repeat phrase 2 points  Total Score 2 points    Immunizations Immunization History  Administered Date(s) Administered   Fluad Quad(high Dose 65+) 09/13/2021   Influenza, High Dose Seasonal PF 09/05/2013, 08/17/2016, 08/13/2017   PFIZER(Purple Top)SARS-COV-2 Vaccination  11/15/2019, 12/06/2019   Pneumococcal Conjugate-13 05/05/2015   Pneumococcal Polysaccharide-23 03/05/2007   Td 01/13/2022   Td,absorbed, Preservative Free, Adult Use, Lf Unspecified 10/30/1996   Tdap 03/05/2007   Zoster Recombinat (Shingrix) 12/07/2021, 04/12/2022   Zoster, Live 10/20/2009    TDAP status: Up to date  Flu Vaccine status: Up to date  Pneumococcal vaccine status: Up to date  Covid-19 vaccine status: Completed vaccines  Qualifies for Shingles Vaccine? Yes   Zostavax completed Yes   Shingrix Completed?: Yes  Screening Tests Health Maintenance  Topic Date Due   COVID-19 Vaccine (3 - Pfizer risk series) 01/03/2020   INFLUENZA VACCINE  05/31/2023   Medicare Annual Wellness (AWV)  02/05/2024   DTaP/Tdap/Td (3 - Td or Tdap) 01/14/2032   Pneumonia Vaccine 59+ Years old  Completed   DEXA SCAN  Completed   Zoster Vaccines- Shingrix  Completed   HPV VACCINES  Aged Out    Health Maintenance  Health Maintenance Due  Topic Date Due   COVID-19 Vaccine (3 - Pfizer risk series) 01/03/2020    Colorectal cancer screening: No longer required.   Mammogram status: No longer required due to age.  Bone Density status: Completed 04/01/2021.   Lung Cancer Screening: (Low Dose CT Chest recommended if Age 98-80 years, 30 pack-year currently smoking OR have quit w/in 15years.) does not qualify.   Lung Cancer Screening Referral: no  Additional Screening:  Hepatitis C Screening: does not qualify;   Vision Screening: Recommended annual ophthalmology exams  for early detection of glaucoma and other disorders of the eye. Is the patient up to date with their annual eye exam?  Yes  Who is the provider or what is the name of the office in which the patient attends annual eye exams? [facility] If pt is not established with a provider, would they like to be referred to a provider to establish care? No .   Dental Screening: Recommended annual dental exams for proper oral  hygiene  Community Resource Referral / Chronic Care Management: CRR required this visit?  No   CCM required this visit?  No      Plan:     I have personally reviewed and noted the following in the patient's chart:   Medical and social history Use of alcohol, tobacco or illicit drugs  Current medications and supplements including opioid prescriptions. Patient is not currently taking opioid prescriptions. Functional ability and status Nutritional status Physical activity Advanced directives List of other physicians Hospitalizations, surgeries, and ER visits in previous 12 months Vitals Screenings to include cognitive, depression, and falls Referrals and appointments  In addition, I have reviewed and discussed with patient certain preventive protocols, quality metrics, and best practice recommendations. A written personalized care plan for preventive services as well as general preventive health recommendations were provided to patient.     Barb Merino, LPN   05/06/339   Nurse Notes: none

## 2023-02-08 DIAGNOSIS — D0470 Carcinoma in situ of skin of unspecified lower limb, including hip: Secondary | ICD-10-CM | POA: Diagnosis not present

## 2023-02-08 DIAGNOSIS — C44319 Basal cell carcinoma of skin of other parts of face: Secondary | ICD-10-CM | POA: Diagnosis not present

## 2023-02-08 DIAGNOSIS — Z85828 Personal history of other malignant neoplasm of skin: Secondary | ICD-10-CM | POA: Diagnosis not present

## 2023-02-08 DIAGNOSIS — D485 Neoplasm of uncertain behavior of skin: Secondary | ICD-10-CM | POA: Diagnosis not present

## 2023-02-08 DIAGNOSIS — Z808 Family history of malignant neoplasm of other organs or systems: Secondary | ICD-10-CM | POA: Diagnosis not present

## 2023-02-08 DIAGNOSIS — L578 Other skin changes due to chronic exposure to nonionizing radiation: Secondary | ICD-10-CM | POA: Diagnosis not present

## 2023-02-08 DIAGNOSIS — D2271 Melanocytic nevi of right lower limb, including hip: Secondary | ICD-10-CM | POA: Diagnosis not present

## 2023-02-08 DIAGNOSIS — L570 Actinic keratosis: Secondary | ICD-10-CM | POA: Diagnosis not present

## 2023-02-08 DIAGNOSIS — L821 Other seborrheic keratosis: Secondary | ICD-10-CM | POA: Diagnosis not present

## 2023-02-08 DIAGNOSIS — D225 Melanocytic nevi of trunk: Secondary | ICD-10-CM | POA: Diagnosis not present

## 2023-03-20 ENCOUNTER — Telehealth: Payer: Self-pay

## 2023-03-20 DIAGNOSIS — H26491 Other secondary cataract, right eye: Secondary | ICD-10-CM | POA: Insufficient documentation

## 2023-03-20 DIAGNOSIS — H43813 Vitreous degeneration, bilateral: Secondary | ICD-10-CM | POA: Insufficient documentation

## 2023-03-20 DIAGNOSIS — H04123 Dry eye syndrome of bilateral lacrimal glands: Secondary | ICD-10-CM | POA: Insufficient documentation

## 2023-03-20 DIAGNOSIS — Z961 Presence of intraocular lens: Secondary | ICD-10-CM | POA: Insufficient documentation

## 2023-03-20 NOTE — Patient Outreach (Signed)
  Care Coordination   Initial Visit Note   03/20/2023 Name: Shelby Cardenas MRN: [id number] DOB: [birth year]  Shelby Cardenas is a 82 y.o. year old female who sees Rudd, Bertram Millard, MD for primary care. I spoke with  Shelby Cardenas by phone today.  What matters to the patients health and wellness today? Managing back and knee issues. Patient remains active(walking)     Goals Addressed             This Visit's Progress    COMPLETED: Care Coordination Activities-No follow up required       Care Coordination Interventions: Advised patient to Annual Wellness exam. Discussed [facility] services and support. Assessed SDOH. Advised to discuss with primary care physician if services needed in the future.          SDOH assessments and interventions completed:  Yes  SDOH Interventions Today    Flowsheet Row Most Recent Value  SDOH Interventions   Food Insecurity Interventions Intervention Not Indicated        Care Coordination Interventions:  Yes, provided   Follow up plan: No further intervention required.   Encounter Outcome:  Pt. Visit Completed   Bary Leriche, RN, MSN [facility] Care Management Care Management Coordinator Direct Line [phone]

## 2023-03-20 NOTE — Patient Instructions (Signed)
Visit Information  Thank you for taking time to visit with me today. Please don't hesitate to contact me if I can be of assistance to you.   Following are the goals we discussed today:   Goals Addressed             This Visit's Progress    COMPLETED: Care Coordination Activities-No follow up required       Care Coordination Interventions: Advised patient to Annual Wellness exam. Discussed [facility] services and support. Assessed SDOH. Advised to discuss with primary care physician if services needed in the future.            If you are experiencing a Mental Health or Behavioral Health Crisis or need someone to talk to, please call the Suicide and Crisis Lifeline: 988   Patient verbalizes understanding of instructions and care plan provided today and agrees to view in MyChart. Active MyChart status and patient understanding of how to access instructions and care plan via MyChart confirmed with patient.     The patient has been provided with contact information for the care management team and has been advised to call with any health related questions or concerns.  No further follow up required: decline  Tymothy Cass J Caelum Federici, RN, MSN [facility] Care Management Care Management Coordinator Direct Line [phone]     

## 2023-03-23 DIAGNOSIS — L570 Actinic keratosis: Secondary | ICD-10-CM | POA: Diagnosis not present

## 2023-03-23 DIAGNOSIS — C44319 Basal cell carcinoma of skin of other parts of face: Secondary | ICD-10-CM | POA: Diagnosis not present

## 2023-03-23 DIAGNOSIS — I8391 Asymptomatic varicose veins of right lower extremity: Secondary | ICD-10-CM | POA: Diagnosis not present

## 2023-03-23 DIAGNOSIS — D2271 Melanocytic nevi of right lower limb, including hip: Secondary | ICD-10-CM | POA: Diagnosis not present

## 2023-03-23 DIAGNOSIS — D692 Other nonthrombocytopenic purpura: Secondary | ICD-10-CM | POA: Diagnosis not present

## 2023-03-23 DIAGNOSIS — L820 Inflamed seborrheic keratosis: Secondary | ICD-10-CM | POA: Diagnosis not present

## 2023-03-23 DIAGNOSIS — Z85828 Personal history of other malignant neoplasm of skin: Secondary | ICD-10-CM | POA: Diagnosis not present

## 2023-03-23 DIAGNOSIS — D0471 Carcinoma in situ of skin of right lower limb, including hip: Secondary | ICD-10-CM | POA: Diagnosis not present

## 2023-03-23 DIAGNOSIS — L821 Other seborrheic keratosis: Secondary | ICD-10-CM | POA: Diagnosis not present

## 2023-03-29 DIAGNOSIS — H401132 Primary open-angle glaucoma, bilateral, moderate stage: Secondary | ICD-10-CM | POA: Diagnosis not present

## 2023-03-29 DIAGNOSIS — H0100B Unspecified blepharitis left eye, upper and lower eyelids: Secondary | ICD-10-CM | POA: Diagnosis not present

## 2023-03-29 DIAGNOSIS — H18523 Epithelial (juvenile) corneal dystrophy, bilateral: Secondary | ICD-10-CM | POA: Diagnosis not present

## 2023-03-29 DIAGNOSIS — H0100A Unspecified blepharitis right eye, upper and lower eyelids: Secondary | ICD-10-CM | POA: Diagnosis not present

## 2023-03-29 DIAGNOSIS — H04123 Dry eye syndrome of bilateral lacrimal glands: Secondary | ICD-10-CM | POA: Diagnosis not present

## 2023-03-29 DIAGNOSIS — Z83511 Family history of glaucoma: Secondary | ICD-10-CM | POA: Diagnosis not present

## 2023-04-02 DIAGNOSIS — K219 Gastro-esophageal reflux disease without esophagitis: Secondary | ICD-10-CM | POA: Diagnosis not present

## 2023-04-02 DIAGNOSIS — M545 Low back pain, unspecified: Secondary | ICD-10-CM | POA: Diagnosis not present

## 2023-04-02 DIAGNOSIS — R600 Localized edema: Secondary | ICD-10-CM | POA: Diagnosis not present

## 2023-04-02 DIAGNOSIS — R32 Unspecified urinary incontinence: Secondary | ICD-10-CM | POA: Diagnosis not present

## 2023-04-02 DIAGNOSIS — R739 Hyperglycemia, unspecified: Secondary | ICD-10-CM | POA: Diagnosis not present

## 2023-04-02 DIAGNOSIS — Z961 Presence of intraocular lens: Secondary | ICD-10-CM | POA: Diagnosis not present

## 2023-04-02 DIAGNOSIS — I10 Essential (primary) hypertension: Secondary | ICD-10-CM | POA: Diagnosis not present

## 2023-04-02 DIAGNOSIS — M810 Age-related osteoporosis without current pathological fracture: Secondary | ICD-10-CM | POA: Diagnosis not present

## 2023-04-02 DIAGNOSIS — M1990 Unspecified osteoarthritis, unspecified site: Secondary | ICD-10-CM | POA: Diagnosis not present

## 2023-04-02 DIAGNOSIS — H409 Unspecified glaucoma: Secondary | ICD-10-CM | POA: Diagnosis not present

## 2023-04-02 DIAGNOSIS — E785 Hyperlipidemia, unspecified: Secondary | ICD-10-CM | POA: Diagnosis not present

## 2023-04-04 ENCOUNTER — Ambulatory Visit
Admission: RE | Admit: 2023-04-04 | Discharge: 2023-04-04 | Disposition: A | Discharge status: Home / Self Care | Payer: Medicare PPO | Source: Ambulatory Visit | Attending: Pulmonary Disease | Admitting: Pulmonary Disease

## 2023-04-04 DIAGNOSIS — R918 Other nonspecific abnormal finding of lung field: Secondary | ICD-10-CM | POA: Diagnosis not present

## 2023-04-04 DIAGNOSIS — R9389 Abnormal findings on diagnostic imaging of other specified body structures: Secondary | ICD-10-CM

## 2023-04-04 DIAGNOSIS — I700 Atherosclerosis of aorta: Secondary | ICD-10-CM | POA: Diagnosis not present

## 2023-04-10 ENCOUNTER — Encounter (HOSPITAL_BASED_OUTPATIENT_CLINIC_OR_DEPARTMENT_OTHER): Payer: Self-pay

## 2023-04-11 ENCOUNTER — Telehealth: Payer: Self-pay | Admitting: Adult Health

## 2023-04-11 NOTE — Telephone Encounter (Signed)
Pt daughter called in trying to reschedule moms appointment but we dont have anything for a PFT before July 5th and her PFT order expires on the 5th

## 2023-04-13 ENCOUNTER — Other Ambulatory Visit: Payer: Self-pay

## 2023-04-13 DIAGNOSIS — R0600 Dyspnea, unspecified: Secondary | ICD-10-CM

## 2023-04-13 NOTE — Telephone Encounter (Signed)
New order has been placed for PFT.Nothing else further needed.

## 2023-04-18 ENCOUNTER — Encounter (HOSPITAL_BASED_OUTPATIENT_CLINIC_OR_DEPARTMENT_OTHER): Payer: Medicare PPO

## 2023-04-18 ENCOUNTER — Ambulatory Visit: Payer: Medicare PPO | Admitting: Adult Health

## 2023-05-23 ENCOUNTER — Ambulatory Visit (INDEPENDENT_AMBULATORY_CARE_PROVIDER_SITE_OTHER): Payer: Medicare PPO | Admitting: Pulmonary Disease

## 2023-05-23 DIAGNOSIS — R0600 Dyspnea, unspecified: Secondary | ICD-10-CM | POA: Diagnosis not present

## 2023-05-23 LAB — PULMONARY FUNCTION TEST
DL/VA: 3.71 ml/min/mmHg/L
DLCO cor: 13.74 ml/min/mmHg
DLCO unc % pred: 75 %
DLCO unc: 13.74 ml/min/mmHg
FEF 25-75 Post: 1.15 L/sec
FEF 25-75 Pre: 1.73 L/sec
FEF2575-%Pred-Post: 88 %
FEF2575-%Pred-Pre: 133 %
FEV1-%Change-Post: -17 %
FEV1-%Pred-Post: 71 %
FEV1-Pre: 1.57 L
FEV1FVC-%Change-Post: -18 %
FEV1FVC-%Pred-Pre: 115 %
FEV6-%Change-Post: 0 %
FEV6-%Pred-Post: 80 %
FEV6-%Pred-Pre: 80 %
FEV6-Post: 1.85 L
FEV6-Pre: 1.86 L
FVC-%Change-Post: 0 %
FVC-%Pred-Post: 76 %
FVC-%Pred-Pre: 75 %
Post FEV1/FVC ratio: 69 %
Post FEV6/FVC ratio: 100 %
Pre FEV1/FVC ratio: 85 %
Pre FEV6/FVC Ratio: 100 %
RV % pred: 130 %
RV: 3.08 L
TLC % pred: 106 %
TLC: 5.24 L

## 2023-05-23 NOTE — Progress Notes (Signed)
Full PFT performed today. °

## 2023-05-23 NOTE — Patient Instructions (Signed)
Full PFT performed today. °

## 2023-06-02 ENCOUNTER — Encounter: Payer: Self-pay | Admitting: Family Medicine

## 2023-06-06 ENCOUNTER — Encounter: Payer: Self-pay | Admitting: Adult Health

## 2023-06-06 ENCOUNTER — Ambulatory Visit: Payer: Medicare PPO | Admitting: Adult Health

## 2023-06-06 VITALS — BP 138/62 | HR 74 | Temp 98.4°F | Ht 64.0 in | Wt 167.0 lb

## 2023-06-06 DIAGNOSIS — J849 Interstitial pulmonary disease, unspecified: Secondary | ICD-10-CM

## 2023-06-06 DIAGNOSIS — R059 Cough, unspecified: Secondary | ICD-10-CM | POA: Diagnosis not present

## 2023-06-06 NOTE — Progress Notes (Signed)
@Patient  ID: Shelby Cardenas, female    DOB: 09-02-1941, 82 y.o.   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 (Historical dx of discoid lupus) Grew up with wood stove, worked on tobacco farm.   TEST/EVENTS : PFT November 02, 2022 showed FEV1 at 106%, ratio 86, FVC 91%, no significant bronchodilator response, DLCO at 77%. PFT May 23, 2023 showed FEV1 at 86%, ratio 85, FVC 75%, no significant bronchodilator response, DLCO 75%.  Serology-CTD/autoimmune labs neg except for ANA + Bronch 10/27/22-Path neg. Cytology neg for malignant cells. 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  Patient has a history of recurrent pneumonia.  Was seen in November 2023 for pulmonary consult after pneumonia and COVID-19 that developed in September 2023.  Patient was initially seen with significant shortness of breath, decreased activity tolerance and stamina.  During her workup autoimmune/CTD serology negative except for a positive ANA along with joint pains.  She was seen by rheumatology October 2023 with Dr. Dimple Casey.  Felt to have no evidence of active disease  despite previous report of diagnosis of discoid lupus (historical diagnosis). Underwent Bronchoscopy that was unrevealing.  Since last visit patient says she is actually doing much better.  She is starting to walk on a consistent basis.  She has been able to build her stamina back up she is walking every day for 30 minutes.  She typically stops 1 time to rest and is able to complete her walk each day.  She says this is a big improvement since her last visit.  Her energy level has picked back up as well.  Her shortness of breath is also decreased.  She has been under quite a bit of stress.  Her husband passed away last week.  Patient denies any increased cough.  She does have an intermittent cough with clear mucus. Recent CT chest in June showed chronic interstitial changes without progression.  PFTs in July showed a slight drop in lung capacity with FVC going from 91% to 75% and diffusing capacity going from 77% to 75%. Patient is a never smoker.  Has no previous diagnosis of COPD or asthma.  Is not on any inhalers.  She denies any hemoptysis or chest pain.   Allergies  Allergen Reactions   Macrobid [Nitrofurantoin] Rash   Boniva [Ibandronic Acid] Other (See Comments)    Muscle aches /headache   Gabapentin     Dizzy and fell when taking     Prednisone     Makes me feel weird    Tizanidine     Low bp   Tramadol Itching   Ciprofloxacin     weird feeling    Codeine Itching and Rash  Plaquenil [Hydroxychloroquine] Rash   Sulfa Antibiotics Itching and Rash    Immunization History  Administered Date(s) Administered   Fluad Quad(high Dose 65+) 09/13/2021   Influenza, High Dose Seasonal PF 09/05/2013, 08/17/2016, 08/13/2017   PFIZER(Purple Top)SARS-COV-2 Vaccination 11/15/2019, 12/06/2019   Pneumococcal Conjugate-13 05/05/2015   Pneumococcal Polysaccharide-23 03/05/2007   Td 01/13/2022   Td,absorbed, Preservative Free, Adult Use, Lf Unspecified 10/30/1996   Tdap 03/05/2007   Zoster  Recombinant(Shingrix) 12/07/2021, 04/12/2022   Zoster, Live 10/20/2009    Past Medical History:  Diagnosis Date   Adenomatous colon polyp 04/11/2016   Allergic rhinitis 02/06/2016   Closed wedge compression fracture of first lumbar vertebra (HCC) 06/09/2021   Cystocele with prolapse 07/02/2018   Diaphragmatic hernia 02/06/2016   GERD (gastroesophageal reflux disease)    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   Incontinence of feces 10/12/2021   Intervertebral disc disorder with radiculopathy of lumbar region 09/06/2021   Lupus (HCC)    Mammogram abnormal 07/16/2018   Formatting of this note might be different from the original. 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 2014: Fracture fibula 2017: -1.8 2019: -1.4 2022: L1 compression fracture   Positive ANA (antinuclear antibody) 10/17/2021   09-2021: 1:40, speckled pattern   Primary open angle glaucoma of both eyes, moderate stage 12/12/2018    Tobacco History: Social History   Tobacco Use  Smoking Status Never   Passive exposure: Past  Smokeless Tobacco Never   Counseling given: Not Answered   Outpatient Medications Prior to Visit  Medication Sig Dispense Refill   Alpha Lipoic Acid 200 MG CAPS Take 200 mg by mouth daily.     Ascorbic Acid (VITAMIN C PO) Take 1 tablet by mouth daily.     b complex vitamins capsule Take 1 capsule by mouth daily.     Calcium-Phosphorus-Vitamin D (CITRACAL +D3 PO) Take 1 capsule by mouth daily.     COLLAGEN PO Take 1 Scoop by mouth daily. Coffee     dextromethorphan-guaiFENesin (MUCINEX DM) 30-600 MG 12hr tablet Take 1 tablet by mouth 2 (two) times daily.     diclofenac (VOLTAREN) 75 MG EC tablet Take 75 mg by mouth 2 (two) times daily. Take one tablet by mouth. Twice a day with food as needed for  pain/inflammation.     diclofenac Sodium (VOLTAREN) 1 % GEL Apply 2 g topically at bedtime. Knee pain     Glycerin-Hypromellose-PEG 400 (DRY EYE RELIEF DROPS OP) Place 1 drop into both eyes 2 (two) times daily.     HYDROcodone bit-homatropine (HYCODAN) 5-1.5 MG/5ML syrup Take 5 mLs by mouth every 8 (eight) hours as needed for cough. 120 mL 0   latanoprost (XALATAN) 0.005 % ophthalmic solution Place 1 drop into both eyes at bedtime.     Multiple Vitamin (MULTIVITAMIN ADULT PO) Take 1 tablet by mouth daily. Balance of nature     Turmeric Curcumin 500 MG CAPS Take 500 mg by mouth daily.     amoxicillin-clavulanate (AUGMENTIN) 875-125 MG tablet Take 1 tablet by mouth 2 (two) times daily. (Patient not taking: Reported on 02/05/2023) 20 tablet 0   benzonatate (TESSALON) 100 MG capsule Take 1 capsule (100 mg total) by mouth every 8 (eight) hours. (Patient not taking: Reported on 02/05/2023) 21 capsule 0   promethazine (PHENERGAN) 25 MG tablet Take 25 mg by  mouth every 6 (six) hours as needed for nausea or vomiting. (Patient not taking: Reported on 06/06/2023)     No facility-administered medications prior to visit.     Review of Systems:   Constitutional:   No  weight loss, night sweats,  Fevers, chills, fatigue, or  lassitude.  HEENT:   No headaches,  Difficulty swallowing,  Tooth/dental problems, or  Sore throat,                No sneezing, itching, ear ache, nasal congestion, post nasal drip,   CV:  No chest pain,  Orthopnea, PND, swelling in lower extremities, anasarca, dizziness, palpitations, syncope.   GI  No heartburn, indigestion, abdominal pain, nausea, vomiting, diarrhea, change in bowel habits, loss of appetite, bloody stools.   Resp:   No chest wall deformity  Skin: no rash or lesions.  GU: no dysuria, change in color of urine, no urgency or frequency.  No flank pain, no hematuria   MS:  No joint pain or swelling.  No decreased range of motion.  No back pain.    Physical  Exam  BP 138/62 (BP Location: Left Arm, Cuff Size: Normal)   Pulse 74   Temp 98.4 F (36.9 C) (Temporal)   Ht 5\' 4"  (1.626 m)   Wt 167 lb (75.8 kg)   SpO2 96%   BMI 28.67 kg/m   GEN: A/Ox3; pleasant , NAD, well nourished    HEENT:  [place]/AT,   NOSE-clear, THROAT-clear, no lesions, no postnasal drip or exudate noted.   NECK:  Supple w/ fair ROM; no JVD; normal carotid impulses w/o bruits; no thyromegaly or nodules palpated; no lymphadenopathy.    RESP  Clear  P & A; w/o, wheezes/ rales/ or rhonchi. no accessory muscle use, no dullness to percussion  CARD:  RRR, no m/r/g, no peripheral edema, pulses intact, no cyanosis or clubbing.  GI:   Soft & nt; nml bowel sounds; no organomegaly or masses detected.   Musco: Warm bil, no deformities or joint swelling noted.   Neuro: alert, no focal deficits noted.    Skin: Warm, no lesions or rashes    Lab Results:  CBC     BNP No results found for: "BNP"  ProBNP No results found for: "PROBNP"  Imaging: No results found.  Administration History     None          Latest Ref Rng & Units 11/02/2022    3:39 PM 05/23/2019    1:32 PM  PFT Results  FVC-Pre L 2.22    FVC-Predicted Pre % 90  75   FVC-Post L 2.24  1.87   FVC-Predicted Post % 91  76   Pre FEV1/FVC % % 84  85   Post FEV1/FCV % % 86  69   FEV1-Pre L 1.85  1.57   FEV1-Predicted Pre % 102  86   FEV1-Post L 1.93  1.30   DLCO uncorrected ml/min/mmHg 13.98  13.74   DLCO UNC% % 77  75   DLCO corrected ml/min/mmHg 13.98  13.74   DLCO COR %Predicted % 77  75   DLVA Predicted % 98  90   TLC L 4.35  5.24   TLC % Predicted % 88  106   RV % Predicted % 88  130     No results found for: "NITRICOXIDE"      Assessment & Plan:   No problem-specific Assessment & Plan notes found for this encounter.     Shelby Cardenas  Guiseppe Flanagan, NP 06/06/2023

## 2023-06-06 NOTE — Patient Instructions (Addendum)
Activity as tolerated.  Set up for Spirometry with DLCO in 6 months .  Follow up with Dr. Marchelle Gearing in 6 months (ILD slot)

## 2023-06-07 DIAGNOSIS — J849 Interstitial pulmonary disease, unspecified: Secondary | ICD-10-CM | POA: Insufficient documentation

## 2023-06-07 NOTE — Assessment & Plan Note (Signed)
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  Recent CT chest showed no evidence of progression.  Recent PFTs did show a slight drop in her FVC.  For now we will continue to monitor.  Previous serology for connective tissue and autoimmune was essentially negative.  ANA was positive but was seen by rheumatology and felt not to have active lupus. Will check spirometry with DLCO on return visit.  Plan  Patient Instructions  Activity as tolerated.  Set up for Spirometry with DLCO in 6 months .  Follow up with Dr. Marchelle Gearing in 6 months (ILD slot)

## 2023-06-18 LAB — PULMONARY FUNCTION TEST
DL/VA % pred: 90 %
DL/VA: 3.71 ml/min/mmHg/L
DLCO cor % pred: 75 %
DLCO cor: 13.74 ml/min/mmHg
DLCO unc % pred: 75 %
DLCO unc: 13.74 ml/min/mmHg
FEF 25-75 Post: 1.15 L/s
FEF 25-75 Pre: 1.73 L/s
FEF2575-%Change-Post: -33 %
FEF2575-%Pred-Post: 88 %
FEF2575-%Pred-Pre: 133 %
FEV1-%Change-Post: -17 %
FEV1-%Pred-Post: 71 %
FEV1-%Pred-Pre: 86 %
FEV1-Post: 1.3 L
FEV1-Pre: 1.57 L
FEV1FVC-%Change-Post: -18 %
FEV1FVC-%Pred-Pre: 115 %
FEV6-%Change-Post: 0 %
FEV6-%Pred-Post: 80 %
FEV6-%Pred-Pre: 80 %
FEV6-Post: 1.85 L
FEV6-Pre: 1.86 L
FEV6FVC-%Pred-Post: 106 %
FEV6FVC-%Pred-Pre: 106 %
FVC-%Change-Post: 0 %
FVC-%Pred-Post: 76 %
FVC-%Pred-Pre: 75 %
FVC-Post: 1.87 L
Post FEV1/FVC ratio: 69 %
Post FEV6/FVC ratio: 100 %
Pre FEV1/FVC ratio: 85 %
Pre FEV6/FVC Ratio: 100 %
RV % pred: 130 %
RV: 3.08 L
TLC % pred: 106 %
TLC: 5.24 L

## 2023-06-20 ENCOUNTER — Other Ambulatory Visit: Payer: Self-pay

## 2023-06-20 DIAGNOSIS — J849 Interstitial pulmonary disease, unspecified: Secondary | ICD-10-CM

## 2023-06-20 DIAGNOSIS — R0600 Dyspnea, unspecified: Secondary | ICD-10-CM

## 2023-06-20 DIAGNOSIS — R059 Cough, unspecified: Secondary | ICD-10-CM

## 2023-07-11 ENCOUNTER — Encounter: Payer: Self-pay | Admitting: Family Medicine

## 2023-07-11 ENCOUNTER — Ambulatory Visit: Payer: Medicare PPO | Admitting: Family Medicine

## 2023-07-11 VITALS — BP 130/78 | HR 66 | Temp 97.7°F | Ht 64.0 in | Wt 166.6 lb

## 2023-07-11 DIAGNOSIS — Z23 Encounter for immunization: Secondary | ICD-10-CM

## 2023-07-11 DIAGNOSIS — M329 Systemic lupus erythematosus, unspecified: Secondary | ICD-10-CM

## 2023-07-11 DIAGNOSIS — E782 Mixed hyperlipidemia: Secondary | ICD-10-CM

## 2023-07-11 DIAGNOSIS — J849 Interstitial pulmonary disease, unspecified: Secondary | ICD-10-CM | POA: Diagnosis not present

## 2023-07-11 DIAGNOSIS — Z13820 Encounter for screening for osteoporosis: Secondary | ICD-10-CM

## 2023-07-11 DIAGNOSIS — Z780 Asymptomatic menopausal state: Secondary | ICD-10-CM | POA: Diagnosis not present

## 2023-07-11 LAB — COMPREHENSIVE METABOLIC PANEL
ALT: 20 U/L (ref 0–35)
AST: 22 U/L (ref 0–37)
Albumin: 4.4 g/dL (ref 3.5–5.2)
Alkaline Phosphatase: 65 U/L (ref 39–117)
BUN: 12 mg/dL (ref 6–23)
CO2: 28 meq/L (ref 19–32)
Calcium: 9.5 mg/dL (ref 8.4–10.5)
Chloride: 98 meq/L (ref 96–112)
Creatinine, Ser: 0.65 mg/dL (ref 0.40–1.20)
GFR: 82.27 mL/min (ref >60.00)
Glucose, Bld: 91 mg/dL (ref 70–99)
Potassium: 4.5 meq/L (ref 3.5–5.1)
Sodium: 134 meq/L — ABNORMAL LOW (ref 135–145)
Total Bilirubin: 0.5 mg/dL (ref 0.2–1.2)
Total Protein: 7.1 g/dL (ref 6.0–8.3)

## 2023-07-11 LAB — LIPID PANEL
Cholesterol: 251 mg/dL — ABNORMAL HIGH (ref 0–200)
HDL: 52.5 mg/dL (ref >39.00)
LDL Cholesterol: 139 mg/dL — ABNORMAL HIGH (ref 0–99)
NonHDL: 198.14
Total CHOL/HDL Ratio: 5
Triglycerides: 296 mg/dL — ABNORMAL HIGH (ref 0.0–149.0)
VLDL: 59.2 mg/dL — ABNORMAL HIGH (ref 0.0–40.0)

## 2023-07-11 LAB — CBC
HCT: 41.2 % (ref 36.0–46.0)
Hemoglobin: 13.5 g/dL (ref 12.0–15.0)
MCHC: 32.8 g/dL (ref 30.0–36.0)
MCV: 93.6 fl (ref 78.0–100.0)
Platelets: 260 10*3/uL (ref 150.0–400.0)
RBC: 4.4 Mil/uL (ref 3.87–5.11)
RDW: 13.6 % (ref 11.5–15.5)
WBC: 6.2 10*3/uL (ref 4.0–10.5)

## 2023-07-11 NOTE — Progress Notes (Signed)
[facility] [address] [place] [address] Dept: [phone] Dept Fax: [phone]  Chronic Care Office Visit  Subjective:    Patient ID: Concetta Dangelo, female    DOB: 11-19-1940, 82 y.o..   MRN: [id number]  Chief Complaint  Patient presents with   Follow-up    F/u.    History of Present Illness:  Patient is in today for reassessment of chronic medical issues.  Ms. Lueras has a history of interstitial lung disease (ILD) which initially presented as a prolonged cough with dyspnea. She is managed by pulmonology. She has noted some gradual improvement of this with time,. She currently is using a cough lozenge that does seem to help her cough mucous out of her chest.  Ms. Andazola has a history of chronic cutaneous lupus (discoid lupus). She is not having active skin lesions at this point.  Ms. Woodrow has a history of a prior compression fracture of L1. She does regular stretching to keep her back from hurting too much.  Since her last visit, Ms. Dean husband died. She spent time discussing the circumstances around his death. The family appears to have worked through much of their grief.  Past Medical History: Patient Active Problem List   Diagnosis Date Noted   ILD (interstitial lung disease) (HCC) 06/07/2023   Right posterior capsular opacification 03/20/2023   Pseudophakia, both eyes 03/20/2023   Posterior vitreous detachment of both eyes 03/20/2023   Dry eye syndrome of both eyes 03/20/2023   Osteoarthritis of right knee 07/23/2022   Osteoarthritis of left knee 07/23/2022   GERD (gastroesophageal reflux disease) 06/06/2022   Lupus (HCC) 06/06/2022   Positive ANA (antinuclear antibody) 10/17/2021   Incontinence of feces 10/12/2021   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   Osteoarthritis of hands, bilateral 10/12/2021   Intervertebral disc disorder with radiculopathy of lumbar region 09/06/2021    Closed wedge compression fracture of first lumbar vertebra (HCC) 06/09/2021   OAB (overactive bladder) 11/12/2020   Primary open angle glaucoma of both eyes, moderate stage 12/12/2018   Mammogram abnormal 07/16/2018   Cystocele with prolapse 07/02/2018   Adenomatous colon polyp 04/11/2016   Mixed hyperlipidemia 02/06/2016   Osteopenia 02/06/2016   Diaphragmatic hernia 02/06/2016   Allergic rhinitis 02/06/2016   Past Surgical History:  Procedure Laterality Date   BRONCHIAL BIOPSY  10/27/2022   Procedure: BRONCHIAL BIOPSIES;  Surgeon: Lupita Leash, MD;  Location: [facility];  Service: Cardiopulmonary;;   BRONCHIAL NEEDLE ASPIRATION BIOPSY  10/27/2022   Procedure: BRONCHIAL NEEDLE ASPIRATION BIOPSIES;  Surgeon: Lupita Leash, MD;  Location: [facility];  Service: Cardiopulmonary;;   BRONCHIAL WASHINGS  10/27/2022   Procedure: BRONCHIAL WASHINGS;  Surgeon: Lupita Leash, MD;  Location: [facility];  Service: Cardiopulmonary;;   CESAREAN SECTION     CHOLECYSTECTOMY     EYE SURGERY     Bilateral Cataracts   HEMOSTASIS CONTROL  10/27/2022   Procedure: HEMOSTASIS CONTROL;  Surgeon: Lupita Leash, MD;  Location: [facility];  Service: Cardiopulmonary;;   KNEE SURGERY Left    NEUROPLASTY / TRANSPOSITION MEDIAN NERVE AT CARPAL TUNNEL  05/2021   Back   SKIN BIOPSY  10/05/2022   left upper arm   SKIN CANCER EXCISION     TONSILLECTOMY     VIDEO BRONCHOSCOPY WITH ENDOBRONCHIAL ULTRASOUND N/A 10/27/2022   Procedure: VIDEO BRONCHOSCOPY WITH ENDOBRONCHIAL ULTRASOUND;  Surgeon: Lupita Leash, MD;  Location: [facility];  Service: Cardiopulmonary;  Laterality: N/A;  Family History  Problem Relation Age of Onset   Arthritis Mother    Arthritis Father    Leukemia Father    Liver disease Brother    Diabetes Brother    Breast cancer Paternal Aunt    Breast cancer Daughter    Outpatient Medications Prior to Visit  Medication Sig Dispense Refill   Alpha Lipoic  Acid 200 MG CAPS Take 200 mg by mouth daily.     Ascorbic Acid (VITAMIN C PO) Take 1 tablet by mouth daily.     b complex vitamins capsule Take 1 capsule by mouth daily.     Calcium-Phosphorus-Vitamin D (CITRACAL +D3 PO) Take 1 capsule by mouth daily.     COLLAGEN PO Take 1 Scoop by mouth daily. Coffee     diclofenac Sodium (VOLTAREN) 1 % GEL Apply 2 g topically at bedtime. Knee pain     Glycerin-Hypromellose-PEG 400 (DRY EYE RELIEF DROPS OP) Place 1 drop into both eyes 2 (two) times daily.     latanoprost (XALATAN) 0.005 % ophthalmic solution Place 1 drop into both eyes at bedtime.     Multiple Vitamin (MULTIVITAMIN ADULT PO) Take 1 tablet by mouth daily. Balance of nature     Turmeric Curcumin 500 MG CAPS Take 500 mg by mouth daily.     amoxicillin-clavulanate (AUGMENTIN) 875-125 MG tablet Take 1 tablet by mouth 2 (two) times daily. (Patient not taking: Reported on 02/05/2023) 20 tablet 0   benzonatate (TESSALON) 100 MG capsule Take 1 capsule (100 mg total) by mouth every 8 (eight) hours. (Patient not taking: Reported on 02/05/2023) 21 capsule 0   dextromethorphan-guaiFENesin (MUCINEX DM) 30-600 MG 12hr tablet Take 1 tablet by mouth 2 (two) times daily.     diclofenac (VOLTAREN) 75 MG EC tablet Take 75 mg by mouth 2 (two) times daily. Take one tablet by mouth. Twice a day with food as needed for pain/inflammation.     HYDROcodone bit-homatropine (HYCODAN) 5-1.5 MG/5ML syrup Take 5 mLs by mouth every 8 (eight) hours as needed for cough. 120 mL 0   promethazine (PHENERGAN) 25 MG tablet Take 25 mg by mouth every 6 (six) hours as needed for nausea or vomiting. (Patient not taking: Reported on 06/06/2023)     No facility-administered medications prior to visit.   Allergies  Allergen Reactions   Macrobid [Nitrofurantoin] Rash   Boniva [Ibandronic Acid] Other (See Comments)    Muscle aches /headache   Gabapentin     Dizzy and fell when taking     Prednisone     Makes me feel weird    Tizanidine      Low bp   Tramadol Itching   Ciprofloxacin     weird feeling    Codeine Itching and Rash   Plaquenil [Hydroxychloroquine] Rash   Sulfa Antibiotics Itching and Rash   Objective:   Today's Vitals   07/11/23 0755  BP: 130/78  Pulse: 66  Temp: 97.7 F (36.5 C)  TempSrc: Temporal  SpO2: 96%  Weight: 166 lb 9.6 oz (75.6 kg)  Height: 5\' 4"  (1.626 m)   Body mass index is 28.6 kg/m.   General: Well developed, well nourished. No acute distress. Lungs: Clear to auscultation bilaterally. No wheezing, rales or rhonchi. CV: RRR without murmurs or rubs. Pulses 2+ bilaterally. Psych: Alert and oriented. Normal mood and affect.  Health Maintenance Due  Topic Date Due   INFLUENZA VACCINE  05/31/2023     Assessment & Plan:   Problem List Items Addressed This  Visit       Respiratory   ILD (interstitial lung disease) (HCC) - Primary    Stable with some mild progressive improvement. She will continue to follow with pulmonology to monitor the status of her ILD.        Other   Lupus (HCC)    Stable. I will check labs to monitor for any disease activity/sequelae.      Relevant Orders   CBC   Comprehensive metabolic panel   Mixed hyperlipidemia    Ms. Heikkinen has had elevated lipids int he past. It has been several years since this was evaluated. I will check fasting lipids today.      Relevant Orders   Lipid panel   Other Visit Diagnoses     Postmenopausal       Relevant Orders   DG Bone Density   Screening for osteoporosis       Relevant Orders   DG Bone Density   Need for immunization against influenza       Relevant Orders   Flu Vaccine Trivalent High Dose (Fluad) (Completed)       Return in about 6 months (around 01/08/2024) for Reassessment.   Loyola Mast, MD

## 2023-07-11 NOTE — Assessment & Plan Note (Signed)
Stable with some mild progressive improvement. She will continue to follow with pulmonology to monitor the status of her ILD.

## 2023-07-11 NOTE — Assessment & Plan Note (Signed)
Shelby Cardenas has had elevated lipids int he past. It has been several years since this was evaluated. I will check fasting lipids today.

## 2023-07-11 NOTE — Assessment & Plan Note (Signed)
Stable. I will check labs to monitor for any disease activity/sequelae.

## 2023-07-31 DIAGNOSIS — Z83511 Family history of glaucoma: Secondary | ICD-10-CM | POA: Diagnosis not present

## 2023-07-31 DIAGNOSIS — H18523 Epithelial (juvenile) corneal dystrophy, bilateral: Secondary | ICD-10-CM | POA: Diagnosis not present

## 2023-07-31 DIAGNOSIS — H0100B Unspecified blepharitis left eye, upper and lower eyelids: Secondary | ICD-10-CM | POA: Diagnosis not present

## 2023-07-31 DIAGNOSIS — H04123 Dry eye syndrome of bilateral lacrimal glands: Secondary | ICD-10-CM | POA: Diagnosis not present

## 2023-07-31 DIAGNOSIS — H401132 Primary open-angle glaucoma, bilateral, moderate stage: Secondary | ICD-10-CM | POA: Diagnosis not present

## 2023-07-31 DIAGNOSIS — H0100A Unspecified blepharitis right eye, upper and lower eyelids: Secondary | ICD-10-CM | POA: Diagnosis not present

## 2023-09-07 DIAGNOSIS — Z85828 Personal history of other malignant neoplasm of skin: Secondary | ICD-10-CM | POA: Diagnosis not present

## 2023-09-07 DIAGNOSIS — D0471 Carcinoma in situ of skin of right lower limb, including hip: Secondary | ICD-10-CM | POA: Diagnosis not present

## 2023-09-07 DIAGNOSIS — L570 Actinic keratosis: Secondary | ICD-10-CM | POA: Diagnosis not present

## 2023-09-07 DIAGNOSIS — D0439 Carcinoma in situ of skin of other parts of face: Secondary | ICD-10-CM | POA: Diagnosis not present

## 2023-09-07 DIAGNOSIS — L821 Other seborrheic keratosis: Secondary | ICD-10-CM | POA: Diagnosis not present

## 2023-09-18 ENCOUNTER — Ambulatory Visit: Payer: Medicare PPO | Admitting: Internal Medicine

## 2023-09-18 ENCOUNTER — Ambulatory Visit (INDEPENDENT_AMBULATORY_CARE_PROVIDER_SITE_OTHER)
Admission: RE | Admit: 2023-09-18 | Discharge: 2023-09-18 | Disposition: A | Discharge status: Home / Self Care | Payer: Medicare PPO | Source: Ambulatory Visit | Attending: Internal Medicine | Admitting: Internal Medicine

## 2023-09-18 ENCOUNTER — Encounter: Payer: Self-pay | Admitting: Internal Medicine

## 2023-09-18 VITALS — BP 148/76 | HR 89 | Temp 97.5°F | Ht 64.0 in | Wt 171.2 lb

## 2023-09-18 DIAGNOSIS — R600 Localized edema: Secondary | ICD-10-CM

## 2023-09-18 DIAGNOSIS — R079 Chest pain, unspecified: Secondary | ICD-10-CM

## 2023-09-18 DIAGNOSIS — R0781 Pleurodynia: Secondary | ICD-10-CM | POA: Diagnosis not present

## 2023-09-18 LAB — CBC WITH DIFFERENTIAL/PLATELET
Basophils Absolute: 0 10*3/uL (ref 0.0–0.1)
Basophils Relative: 0.5 % (ref 0.0–3.0)
Eosinophils Absolute: 0.1 10*3/uL (ref 0.0–0.7)
Eosinophils Relative: 1.6 % (ref 0.0–5.0)
HCT: 41.3 % (ref 36.0–46.0)
Hemoglobin: 13.7 g/dL (ref 12.0–15.0)
Lymphocytes Relative: 40.3 % (ref 12.0–46.0)
Lymphs Abs: 3 10*3/uL (ref 0.7–4.0)
MCHC: 33.2 g/dL (ref 30.0–36.0)
MCV: 92.4 fL (ref 78.0–100.0)
Monocytes Absolute: 0.5 10*3/uL (ref 0.1–1.0)
Monocytes Relative: 6.4 % (ref 3.0–12.0)
Neutro Abs: 3.8 10*3/uL (ref 1.4–7.7)
Neutrophils Relative %: 51.2 % (ref 43.0–77.0)
Platelets: 259 10*3/uL (ref 150.0–400.0)
RBC: 4.47 Mil/uL (ref 3.87–5.11)
RDW: 12.7 % (ref 11.5–15.5)
WBC: 7.4 10*3/uL (ref 4.0–10.5)

## 2023-09-18 LAB — COMPREHENSIVE METABOLIC PANEL
ALT: 17 U/L (ref 0–35)
AST: 22 U/L (ref 0–37)
Albumin: 4.4 g/dL (ref 3.5–5.2)
Alkaline Phosphatase: 59 U/L (ref 39–117)
BUN: 16 mg/dL (ref 6–23)
CO2: 25 meq/L (ref 19–32)
Calcium: 9.4 mg/dL (ref 8.4–10.5)
Chloride: 96 meq/L (ref 96–112)
Creatinine, Ser: 0.58 mg/dL (ref 0.40–1.20)
GFR: 84.45 mL/min (ref >60.00)
Glucose, Bld: 134 mg/dL — ABNORMAL HIGH (ref 70–99)
Potassium: 3.7 meq/L (ref 3.5–5.1)
Sodium: 131 meq/L — ABNORMAL LOW (ref 135–145)
Total Bilirubin: 0.3 mg/dL (ref 0.2–1.2)
Total Protein: 6.8 g/dL (ref 6.0–8.3)

## 2023-09-18 LAB — D-DIMER, QUANTITATIVE: D-Dimer, Quant: 0.79 ug{FEU}/mL — ABNORMAL HIGH (ref <0.50)

## 2023-09-18 LAB — TROPONIN I (HIGH SENSITIVITY): High Sens Troponin I: 4 ng/L (ref 2–17)

## 2023-09-18 LAB — BRAIN NATRIURETIC PEPTIDE: Pro B Natriuretic peptide (BNP): 39 pg/mL (ref 0.0–100.0)

## 2023-09-18 NOTE — Patient Instructions (Signed)
I will be in touch with results.   Go for chest xray

## 2023-09-18 NOTE — Progress Notes (Signed)
[facility] [address] [place] [address] Dept: [phone] Dept Fax: [phone]  Acute Care Office Visit  Subjective:   Shelby Cardenas [birth year] 09/18/2023  Chief Complaint  Patient presents with   Chest Pain    HPI: Discussed the use of AI scribe software for clinical note transcription with the patient, who gave verbal consent to proceed.  History of Present Illness   The patient, with a history of interstitial lung disease, osteopenia, high cholesterol, lupus, and acid reflux, presents with bilateral rib cage and chest pain, more pronounced on the right side, that has been ongoing for several weeks. The pain was initially thought to be a pulled muscle and was managed with alternating ibuprofen and Tylenol, which initially provided some relief but is no longer effective. The pain is described as a squeezing sensation, "feels like my bra is on too tight" and is exacerbated by movement, bending, and twisting. The only relief is found by sitting up straight or lying flat on her back.  In addition to the pain, the patient has noticed an increase in shortness of breath and a return of a previously present cough, which is now producing more mucus. She did have PNA in March 2024. She denies fever, chills, rash, nausea, vomiting, diarrhea, and abdominal pain, blood in stool. She has noticed increased fatigue and leg swelling, which is more than usual despite wearing knee compression for arthritic knees. The patient also reports a worsening of acid reflux symptoms in the last two weeks, which she manages with apple cider vinegar.   No recent long trips or travel. No recent injury or falls.    The following portions of the patient's history were reviewed and updated as appropriate: past medical history, past surgical history, family history, social history, allergies, medications, and problem list.   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   Osteoarthritis of hands, bilateral 10/12/2021   Intervertebral disc disorder with radiculopathy of lumbar region 09/06/2021   Closed wedge compression fracture of first lumbar vertebra (HCC) 06/09/2021   OAB (overactive bladder) 11/12/2020   Primary open angle glaucoma of both eyes, moderate stage 12/12/2018   Mammogram abnormal 07/16/2018   Cystocele with prolapse 07/02/2018   Adenomatous colon polyp 04/11/2016   Mixed hyperlipidemia 02/06/2016   Osteopenia 02/06/2016   Diaphragmatic hernia 02/06/2016   Allergic rhinitis 02/06/2016   Past Medical History:  Diagnosis Date   Adenomatous colon polyp 04/11/2016   Allergic rhinitis 02/06/2016   Closed wedge compression fracture of first lumbar vertebra (HCC) 06/09/2021   Cystocele with prolapse 07/02/2018   Diaphragmatic hernia 02/06/2016   GERD (gastroesophageal reflux disease)    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 2019: right asymmetry   Mixed hyperlipidemia 02/06/2016  OAB (overactive bladder) 11/12/2020   Osteoarthritis of hands, bilateral 10/12/2021   Osteopenia 02/06/2016   Formatting of this note might be different from the original. 2014: Fracture fibula 2017:  -1.8 2019: -1.4 2022: L1 compression fracture   Positive ANA (antinuclear antibody) 10/17/2021   09-2021: 1:40, speckled pattern   Primary open angle glaucoma of both eyes, moderate stage 12/12/2018   Past Surgical History:  Procedure Laterality Date   BRONCHIAL BIOPSY  10/27/2022   Procedure: BRONCHIAL BIOPSIES;  Surgeon: Lupita Leash, MD;  Location: [facility];  Service: Cardiopulmonary;;   BRONCHIAL NEEDLE ASPIRATION BIOPSY  10/27/2022   Procedure: BRONCHIAL NEEDLE ASPIRATION BIOPSIES;  Surgeon: Lupita Leash, MD;  Location: [facility];  Service: Cardiopulmonary;;   BRONCHIAL WASHINGS  10/27/2022   Procedure: BRONCHIAL WASHINGS;  Surgeon: Lupita Leash, MD;  Location: [facility];  Service: Cardiopulmonary;;   CESAREAN SECTION     CHOLECYSTECTOMY     EYE SURGERY     Bilateral Cataracts   HEMOSTASIS CONTROL  10/27/2022   Procedure: HEMOSTASIS CONTROL;  Surgeon: Lupita Leash, MD;  Location: [facility];  Service: Cardiopulmonary;;   KNEE SURGERY Left    NEUROPLASTY / TRANSPOSITION MEDIAN NERVE AT CARPAL TUNNEL  05/2021   Back   SKIN BIOPSY  10/05/2022   left upper arm   SKIN CANCER EXCISION     TONSILLECTOMY     VIDEO BRONCHOSCOPY WITH ENDOBRONCHIAL ULTRASOUND N/A 10/27/2022   Procedure: VIDEO BRONCHOSCOPY WITH ENDOBRONCHIAL ULTRASOUND;  Surgeon: Lupita Leash, MD;  Location: [facility];  Service: Cardiopulmonary;  Laterality: N/A;   Family History  Problem Relation Age of Onset   Arthritis Mother    Arthritis Father    Leukemia Father    Liver disease Brother    Diabetes Brother    Breast cancer Paternal Aunt    Breast cancer Daughter     Current Outpatient Medications:    Alpha Lipoic Acid 200 MG CAPS, Take 200 mg by mouth daily., Disp: , Rfl:    Ascorbic Acid (VITAMIN C PO), Take 1 tablet by mouth daily., Disp: , Rfl:    b complex vitamins capsule, Take 1 capsule by mouth daily., Disp: , Rfl:    Calcium-Phosphorus-Vitamin D (CITRACAL  +D3 PO), Take 1 capsule by mouth daily., Disp: , Rfl:    COLLAGEN PO, Take 1 Scoop by mouth daily. Coffee, Disp: , Rfl:    diclofenac Sodium (VOLTAREN) 1 % GEL, Apply 2 g topically at bedtime. Knee pain, Disp: , Rfl:    Glycerin-Hypromellose-PEG 400 (DRY EYE RELIEF DROPS OP), Place 1 drop into both eyes 2 (two) times daily., Disp: , Rfl:    latanoprost (XALATAN) 0.005 % ophthalmic solution, Place 1 drop into both eyes at bedtime., Disp: , Rfl:    Multiple Vitamin (MULTIVITAMIN ADULT PO), Take 1 tablet by mouth daily. Balance of nature, Disp: , Rfl:    Turmeric Curcumin 500 MG CAPS, Take 500 mg by mouth daily., Disp: , Rfl:  Allergies  Allergen Reactions   Macrobid [Nitrofurantoin] Rash   Boniva [Ibandronic Acid] Other (See Comments)    Muscle aches /headache   Gabapentin     Dizzy and fell when taking     Prednisone     Makes me feel weird    Tizanidine     Low bp   Tramadol Itching   Ciprofloxacin     weird feeling    Codeine Itching and Rash   Plaquenil [Hydroxychloroquine] Rash   Sulfa Antibiotics Itching and  Rash     ROS: A complete ROS was performed with pertinent positives/negatives noted in the HPI. The remainder of the ROS are negative.    Objective:   Today's Vitals   09/18/23 1310  BP: (!) 148/76  Pulse: 89  Temp: (!) 97.5 F (36.4 C)  TempSrc: Temporal  SpO2: 99%  Weight: 171 lb 3.2 oz (77.7 kg)  Height: 5\' 4"  (1.626 m)    GENERAL: Well-appearing, in NAD. Well nourished.  SKIN: Pink, warm and dry. No rash, lesion, ulceration, or ecchymoses.  NECK: Trachea midline. Full ROM w/o pain or tenderness. No lymphadenopathy.  RESPIRATORY: Chest wall symmetrical. TTP with firm palpation to R. Side of rib cage. Respirations even and non-labored. SHOB with exertion. Breath sounds clear to auscultation bilaterally. No wheezing, crackles, or rhonchi.  CARDIAC: S1, S2 present, regular rate and rhythm. Peripheral pulses 2+ bilaterally.  MSK: Muscle tone and strength  appropriate for age. Joints w/o tenderness, redness, or swelling. EXTREMITIES: Without clubbing, cyanosis. 1+ BLE edema NEUROLOGIC: No motor or sensory deficits. Steady, even gait.  PSYCH/MENTAL STATUS: Alert, oriented x 3. Cooperative, appropriate mood and affect.   EKG RESULT: EKG tracing is personally reviewed.   EKG: normal EKG, normal sinus rhythm, RBBB unchanged from previous tracings.    No results found for any visits on 09/18/23.    Assessment & Plan:  Assessment and Plan    Chest and Rib Cage Pain New onset bilateral rib and chest pain, worse on the right side, described as a squeezing sensation. Pain is exacerbated by movement and relieved by lying flat or sitting up straight. No associated fever, nausea, vomiting, or abdominal pain. Noted increased shortness of breath and cough with increased sputum production. History of interstitial lung disease and pneumonia. -Order CBC, CMP, D-dimer, BNP, Troponin, and chest X-ray with ribs -Order EKG  Follow-up Pending results of diagnostic tests.       No orders of the defined types were placed in this encounter.  Orders Placed This Encounter  Procedures   DG Ribs Bilateral W/Chest    Standing Status:   Future    Standing Expiration Date:   09/17/2024    Order Specific Question:   Reason for Exam (SYMPTOM  OR DIAGNOSIS REQUIRED)    Answer:   bilateral rib cage pain and chest pain. hx of pneumonia and interstitial lung disease    Order Specific Question:   Preferred imaging location?    Answer:   Internal   CBC with Differential/Platelet   Comp Met (CMET)   D-Dimer, Quantitative   Brain natriuretic peptide   EKG 12-Lead   Lab Orders         CBC with Differential/Platelet         Comp Met (CMET)         D-Dimer, Quantitative         Brain natriuretic peptide     No images are attached to the encounter or orders placed in the encounter.  Return if symptoms worsen or fail to improve.   Salvatore Decent, FNP

## 2023-09-20 ENCOUNTER — Ambulatory Visit (HOSPITAL_BASED_OUTPATIENT_CLINIC_OR_DEPARTMENT_OTHER)
Admission: RE | Admit: 2023-09-20 | Discharge: 2023-09-20 | Disposition: A | Discharge status: Home / Self Care | Payer: Medicare PPO | Source: Ambulatory Visit | Attending: Internal Medicine | Admitting: Internal Medicine

## 2023-09-20 ENCOUNTER — Other Ambulatory Visit: Payer: Self-pay | Admitting: Internal Medicine

## 2023-09-20 ENCOUNTER — Telehealth (HOSPITAL_BASED_OUTPATIENT_CLINIC_OR_DEPARTMENT_OTHER): Payer: Self-pay | Admitting: Family Medicine

## 2023-09-20 DIAGNOSIS — J849 Interstitial pulmonary disease, unspecified: Secondary | ICD-10-CM | POA: Diagnosis not present

## 2023-09-20 DIAGNOSIS — I2510 Atherosclerotic heart disease of native coronary artery without angina pectoris: Secondary | ICD-10-CM | POA: Diagnosis not present

## 2023-09-20 DIAGNOSIS — R079 Chest pain, unspecified: Secondary | ICD-10-CM

## 2023-09-20 DIAGNOSIS — R7989 Other specified abnormal findings of blood chemistry: Secondary | ICD-10-CM

## 2023-09-20 DIAGNOSIS — R0609 Other forms of dyspnea: Secondary | ICD-10-CM | POA: Insufficient documentation

## 2023-09-20 DIAGNOSIS — R0602 Shortness of breath: Secondary | ICD-10-CM | POA: Diagnosis not present

## 2023-09-20 MED ORDER — IOHEXOL 350 MG/ML SOLN
100.0000 mL | Freq: Once | INTRAVENOUS | Status: AC | PRN
  Administered 2023-09-20: 75 mL via INTRAVENOUS

## 2023-09-21 ENCOUNTER — Other Ambulatory Visit: Payer: Self-pay | Admitting: Internal Medicine

## 2023-09-21 DIAGNOSIS — T148XXA Other injury of unspecified body region, initial encounter: Secondary | ICD-10-CM

## 2023-09-21 MED ORDER — LIDOCAINE 4 % EX PTCH: 1.0000 | MEDICATED_PATCH | CUTANEOUS | 0 refills | Status: DC

## 2023-09-26 ENCOUNTER — Encounter: Payer: Self-pay | Admitting: Family Medicine

## 2023-09-26 DIAGNOSIS — T148XXA Other injury of unspecified body region, initial encounter: Secondary | ICD-10-CM

## 2023-10-01 MED ORDER — LIDOCAINE 4 % EX PTCH: 1.0000 | MEDICATED_PATCH | CUTANEOUS | 1 refills | Status: DC

## 2023-10-01 NOTE — Addendum Note (Signed)
Addended by: Loyola Mast on: 10/01/2023 01:18 PM   Modules accepted: Orders

## 2023-10-01 NOTE — Progress Notes (Unsigned)
[facility] [address] [place] [address] Dept: [phone] Dept Fax: [phone]  Acute Care Office Visit  Subjective:   Shelby Cardenas 02/09/1941 10/02/2023  No chief complaint on file.   HPI: Discussed the use of AI scribe software for clinical note transcription with the patient, who gave verbal consent to proceed.  History of Present Illness             The following portions of the patient's history were reviewed and updated as appropriate: past medical history, past surgical history, family history, social history, allergies, medications, and problem list.   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   Osteoarthritis of hands, bilateral 10/12/2021   Intervertebral disc disorder with radiculopathy of lumbar region 09/06/2021   Closed wedge compression fracture of first lumbar vertebra (HCC) 06/09/2021   OAB (overactive bladder) 11/12/2020   Primary open angle glaucoma of both eyes, moderate stage 12/12/2018   Mammogram abnormal 07/16/2018   Cystocele with prolapse 07/02/2018   Adenomatous colon polyp 04/11/2016   Mixed hyperlipidemia 02/06/2016   Osteopenia 02/06/2016   Diaphragmatic hernia 02/06/2016   Allergic rhinitis 02/06/2016   Past Medical History:  Diagnosis Date   Adenomatous colon polyp 04/11/2016   Allergic rhinitis 02/06/2016   Closed wedge compression  fracture of first lumbar vertebra (HCC) 06/09/2021   Cystocele with prolapse 07/02/2018   Diaphragmatic hernia 02/06/2016   GERD (gastroesophageal reflux disease)    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 2014: Fracture fibula 2017: -1.8 2019: -1.4 2022: L1 compression fracture   Positive ANA (antinuclear antibody) 10/17/2021   09-2021: 1:40, speckled pattern   Primary open angle glaucoma of both eyes, moderate stage 12/12/2018   Past Surgical History:  Procedure Laterality Date   BRONCHIAL BIOPSY  10/27/2022   Procedure: BRONCHIAL BIOPSIES;  Surgeon: Lupita Leash, MD;  [facility] END[facility]COPY;  Service: Cardiopulmonary;;   BRONCHIAL NEEDLE ASPIRATION BIOPSY  10/27/2022   Procedure: BRONCHIAL NEEDLE ASPIRATION BIOPSIES;  Surgeon: Lupita Leash, MD;  Location: [facility];  Service: Cardiopulmonary;;   BRONCHIAL WASHINGS  10/27/2022   Procedure: BRONCHIAL WASHINGS;  Surgeon: Lupita Leash, MD;  Location: [facility];  Service: Cardiopulmonary;;   CESAREAN SECTION     CHOLECYSTECTOMY     EYE SURGERY     Bilateral Cataracts   HEMOSTASIS CONTROL  10/27/2022   Procedure: HEMOSTASIS CONTROL;  Surgeon: Lupita Leash, MD;  Location: [facility];  Service: Cardiopulmonary;;   KNEE SURGERY Left    NEUROPLASTY / TRANSPOSITION MEDIAN NERVE AT CARPAL  TUNNEL  05/2021   Back   SKIN BIOPSY  10/05/2022   left upper arm   SKIN CANCER EXCISION     TONSILLECTOMY     VIDEO BRONCHOSCOPY WITH ENDOBRONCHIAL ULTRASOUND N/A 10/27/2022    Procedure: VIDEO BRONCHOSCOPY WITH ENDOBRONCHIAL ULTRASOUND;  Surgeon: Lupita Leash, MD;  Location[facility] ENDOSCOPY;  Service: Cardiopulmonary;  Laterality: N/A;   Family History  Problem Relation Age of Onset   Arthritis Mother    Arthritis Father    Leukemia Father    Liver disease Brother    Diabetes Brother    Breast cancer Paternal Aunt    Breast cancer Daughter     Current Outpatient Medications:    Alpha Lipoic Acid 200 MG CAPS, Take 200 mg by mouth daily., Disp: , Rfl:    Ascorbic Acid (VITAMIN C PO), Take 1 tablet by mouth daily., Disp: , Rfl:    b complex vitamins capsule, Take 1 capsule by mouth daily., Disp: , Rfl:    Calcium-Phosphorus-Vitamin D (CITRACAL +D3 PO), Take 1 capsule by mouth daily., Disp: , Rfl:    COLLAGEN PO, Take 1 Scoop by mouth daily. Coffee, Disp: , Rfl:    diclofenac Sodium (VOLTAREN) 1 % GEL, Apply 2 g topically at bedtime. Knee pain, Disp: , Rfl:    Glycerin-Hypromellose-PEG 400 (DRY EYE RELIEF DROPS OP), Place 1 drop into both eyes 2 (two) times daily., Disp: , Rfl:    latanoprost (XALATAN) 0.005 % ophthalmic solution, Place 1 drop into both eyes at bedtime., Disp: , Rfl:    lidocaine 4 %, Place 1 patch onto the skin daily., Disp: 20 patch, Rfl: 1   Multiple Vitamin (MULTIVITAMIN ADULT PO), Take 1 tablet by mouth daily. Balance of nature, Disp: , Rfl:    Turmeric Curcumin 500 MG CAPS, Take 500 mg by mouth daily., Disp: , Rfl:  Allergies  Allergen Reactions   Macrobid [Nitrofurantoin] Rash   Boniva [Ibandronic Acid] Other (See Comments)    Muscle aches /headache   Gabapentin     Dizzy and fell when taking     Prednisone     Makes me feel weird    Tizanidine     Low bp   Tramadol Itching   Ciprofloxacin     weird feeling    Codeine Itching and Rash   Plaquenil [Hydroxychloroquine] Rash   Sulfa Antibiotics Itching and Rash     ROS: A complete ROS was performed with pertinent positives/negatives noted in the HPI. The remainder of the  ROS are negative.    Objective:   There were no vitals filed for this visit.  GENERAL: Well-appearing, in NAD. Well nourished.  SKIN: Pink, warm and dry. No rash, lesion, ulceration, or ecchymoses.  NECK: Trachea midline. Full ROM w/o pain or tenderness. No lymphadenopathy.  RESPIRATORY: Chest wall symmetrical. Respirations even and non-labored. Breath sounds clear to auscultation bilaterally.  CARDIAC: S1, S2 present, regular rate and rhythm. Peripheral pulses 2+ bilaterally.  MSK: Muscle tone and strength appropriate for age. Joints w/o tenderness, redness, or swelling. EXTREMITIES: Without clubbing, cyanosis, or edema.  NEUROLOGIC: No motor or sensory deficits. Steady, even gait.  PSYCH/MENTAL STATUS: Alert, oriented x 3. Cooperative, appropriate mood and affect.    No results found for any visits on 10/02/23.    Assessment & Plan:  Assessment and Plan               There are no diagnoses linked to this encounter. No orders of the defined types were  placed in this encounter.  No orders of the defined types were placed in this encounter.  Lab Orders  No laboratory test(s) ordered today   No images are attached to the encounter or orders placed in the encounter.  No follow-ups on file.   Salvatore Decent, FNP

## 2023-10-02 ENCOUNTER — Ambulatory Visit: Payer: Medicare PPO | Admitting: Internal Medicine

## 2023-10-02 ENCOUNTER — Ambulatory Visit
Admission: RE | Admit: 2023-10-02 | Discharge: 2023-10-02 | Disposition: A | Discharge status: Home / Self Care | Payer: Medicare PPO | Source: Ambulatory Visit | Attending: Internal Medicine | Admitting: Internal Medicine

## 2023-10-02 ENCOUNTER — Encounter: Payer: Self-pay | Admitting: Internal Medicine

## 2023-10-02 VITALS — BP 160/90 | HR 89 | Temp 97.8°F | Ht 64.0 in | Wt 169.0 lb

## 2023-10-02 DIAGNOSIS — M545 Low back pain, unspecified: Secondary | ICD-10-CM | POA: Diagnosis not present

## 2023-10-02 DIAGNOSIS — M546 Pain in thoracic spine: Secondary | ICD-10-CM

## 2023-10-02 DIAGNOSIS — M47814 Spondylosis without myelopathy or radiculopathy, thoracic region: Secondary | ICD-10-CM | POA: Diagnosis not present

## 2023-10-02 DIAGNOSIS — M549 Dorsalgia, unspecified: Secondary | ICD-10-CM | POA: Diagnosis not present

## 2023-10-02 MED ORDER — METHOCARBAMOL 500 MG PO TABS: 500.0000 mg | ORAL_TABLET | Freq: Three times a day (TID) | ORAL | 0 refills | Status: DC | PRN

## 2023-10-02 NOTE — Patient Instructions (Signed)
Use heat pad as needed  Tylenol/ibuprofen for pain  Use muscle relaxant as prescribed  Can continue voltaren gel and lidocaine patches as needed Go for xrays of back

## 2023-10-08 ENCOUNTER — Ambulatory Visit: Payer: Medicare PPO | Admitting: Family Medicine

## 2023-10-10 ENCOUNTER — Telehealth: Payer: Self-pay

## 2023-10-10 NOTE — Telephone Encounter (Signed)
[facility] and they will push to have the radiologist read the test.  Will call patient once they are reviewed by provider. Dm/cma

## 2023-10-11 NOTE — Telephone Encounter (Signed)
Xray results now in the chart.  Can you please and thank you review and advise.  Thanks. Dm/cma

## 2023-10-16 DIAGNOSIS — R262 Difficulty in walking, not elsewhere classified: Secondary | ICD-10-CM | POA: Diagnosis not present

## 2023-10-16 DIAGNOSIS — R1084 Generalized abdominal pain: Secondary | ICD-10-CM | POA: Diagnosis not present

## 2023-10-16 DIAGNOSIS — M545 Low back pain, unspecified: Secondary | ICD-10-CM | POA: Diagnosis not present

## 2023-10-18 DIAGNOSIS — M545 Low back pain, unspecified: Secondary | ICD-10-CM | POA: Diagnosis not present

## 2023-10-18 DIAGNOSIS — R1084 Generalized abdominal pain: Secondary | ICD-10-CM | POA: Diagnosis not present

## 2023-10-18 DIAGNOSIS — R262 Difficulty in walking, not elsewhere classified: Secondary | ICD-10-CM | POA: Diagnosis not present

## 2023-10-23 DIAGNOSIS — R262 Difficulty in walking, not elsewhere classified: Secondary | ICD-10-CM | POA: Diagnosis not present

## 2023-10-23 DIAGNOSIS — R1084 Generalized abdominal pain: Secondary | ICD-10-CM | POA: Diagnosis not present

## 2023-10-23 DIAGNOSIS — M545 Low back pain, unspecified: Secondary | ICD-10-CM | POA: Diagnosis not present

## 2023-10-25 DIAGNOSIS — R1084 Generalized abdominal pain: Secondary | ICD-10-CM | POA: Diagnosis not present

## 2023-10-25 DIAGNOSIS — R262 Difficulty in walking, not elsewhere classified: Secondary | ICD-10-CM | POA: Diagnosis not present

## 2023-10-25 DIAGNOSIS — M545 Low back pain, unspecified: Secondary | ICD-10-CM | POA: Diagnosis not present

## 2023-10-30 ENCOUNTER — Other Ambulatory Visit: Payer: Self-pay | Admitting: Internal Medicine

## 2023-10-30 DIAGNOSIS — R1084 Generalized abdominal pain: Secondary | ICD-10-CM | POA: Diagnosis not present

## 2023-10-30 DIAGNOSIS — R262 Difficulty in walking, not elsewhere classified: Secondary | ICD-10-CM | POA: Diagnosis not present

## 2023-10-30 DIAGNOSIS — M546 Pain in thoracic spine: Secondary | ICD-10-CM

## 2023-10-30 DIAGNOSIS — M545 Low back pain, unspecified: Secondary | ICD-10-CM

## 2023-10-30 MED ORDER — METHOCARBAMOL 500 MG PO TABS: 500.0000 mg | ORAL_TABLET | Freq: Three times a day (TID) | ORAL | 0 refills | Status: DC | PRN

## 2023-11-01 DIAGNOSIS — M545 Low back pain, unspecified: Secondary | ICD-10-CM | POA: Diagnosis not present

## 2023-11-01 DIAGNOSIS — R262 Difficulty in walking, not elsewhere classified: Secondary | ICD-10-CM | POA: Diagnosis not present

## 2023-11-01 DIAGNOSIS — R1084 Generalized abdominal pain: Secondary | ICD-10-CM | POA: Diagnosis not present

## 2023-11-07 ENCOUNTER — Ambulatory Visit: Payer: Medicare PPO | Admitting: Family Medicine

## 2023-11-07 ENCOUNTER — Encounter: Payer: Self-pay | Admitting: Family Medicine

## 2023-11-07 VITALS — BP 142/70 | HR 80 | Temp 98.0°F | Ht 64.0 in | Wt 166.2 lb

## 2023-11-07 DIAGNOSIS — M5416 Radiculopathy, lumbar region: Secondary | ICD-10-CM | POA: Diagnosis not present

## 2023-11-07 MED ORDER — IBUPROFEN 600 MG PO TABS: 600.0000 mg | ORAL_TABLET | Freq: Three times a day (TID) | ORAL | 2 refills | Status: AC | PRN

## 2023-11-07 NOTE — Assessment & Plan Note (Signed)
 Shelby Cardenas has lower back pain, now with radiculopathy symptoms. She has had a prior spinal compression fracture and been noted to have disc bulging. Her plain x-rays have not shown a source and she has failed conservative management. I will move ahead with an MRI scan. In the meantime, will try increasing the dose of her ibuprofen , as she ahs an intolerance for tramadol  and codeine.

## 2023-11-07 NOTE — Progress Notes (Signed)
 [facility]IMARY [facility]ARE [facility] [address] [place] [address] Dept: [phone] Dept Fax: [phone]  Office Visit  Subjective:    Patient ID: Shelby Cardenas, female    DOB: 03-Dec-1940, 83 y.o..   MRN: [id number]  Chief Complaint  Patient presents with   Back Pain    C/o still having mid- low back pain that is radiating down the RT leg.  Has been Tylenol  and Ibuprofen  as well as the methocarbamol .     History of Present Illness:  Patient is in today for reassessment of her low back pain. Shelby Cardenas has been seeing Rosina Senters over the past 2 months. She was having pain, characterized as a squeezing pain of the right chest, not associated with any cardiac or respiratory issues. Ms. Senters had conducted an evaluation including labs, EKG, and a chest-x-ray. As her d-dimer was positive, she did also have a CTA, which was negative for a PE. ON follow-up ion early Dec, her pain was now also involving the right lumbar area. Ms. Senters check thoracic and lumbar x-rays as well.  Today, Shelby Cardenas notes progressive right-sided lower thoracic/upper lumbar pain. This is making it difficult for her to walk and is exacerbated by going from sitting to standing, stepping up/down, or bending. She admits to some area of tingling having developed across the anterolateral thigh on the right. She is not seeing any leg muscle weakness. She has been managing this with ibuprofen , Tylenol , and methocarbamol , but notes the relief is only temporary. Shelby Cardenas has a history of a prior L1 compression fracture and is s/p kyphoplasty.  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   Osteoarthritis of hands, bilateral 10/12/2021   Intervertebral disc disorder with radiculopathy of lumbar region 09/06/2021   Closed wedge compression fracture of first lumbar vertebra (HCC) 06/09/2021   OAB (overactive bladder) 11/12/2020   Primary open angle glaucoma of both eyes, moderate stage 12/12/2018   Mammogram abnormal 07/16/2018   Cystocele with prolapse 07/02/2018   Adenomatous colon polyp 04/11/2016   Mixed hyperlipidemia 02/06/2016   Osteopenia 02/06/2016   Diaphragmatic hernia 02/06/2016   Allergic rhinitis 02/06/2016   Past Surgical History:  Procedure Laterality Date   BRONCHIAL BIOPSY  10/27/2022   Procedure: BRONCHIAL BIOPSIES;  Surgeon: Alaine Vicenta NOVAK, MD;  Location: [facility];  Service: Cardiopulmonary;;   BRONCHIAL NEEDLE ASPIRATION BIOPSY  10/27/2022   Procedure: BRONCHIAL NEEDLE ASPIRATION BIOPSIES;  Surgeon: Alaine Vicenta NOVAK, MD;  Location: [facility];  Service: Cardiopulmonary;;   BRONCHIAL WASHINGS  10/27/2022   Procedure: BRONCHIAL WASHINGS;  Surgeon: Alaine Vicenta NOVAK, MD;  Location: [facility];  Service: Cardiopulmonary;;   CESAREAN SECTION     CHOLECYSTECTOMY     EYE SURGERY     Bilateral Cataracts   HEMOSTASIS CONTROL  10/27/2022   Procedure: HEMOSTASIS CONTROL;  Surgeon: Alaine Vicenta NOVAK, MD;  Location: [facility] ENDOSCOPY;  Service: Cardiopulmonary;;   KNEE SURGERY Left    NEUROPLASTY /  TRANSPOSITION MEDIAN NERVE AT CARPAL TUNNEL  05/2021   Back   SKIN BIOPSY  10/05/2022   left upper arm   SKIN CANCER EXCISION     TONSILLECTOMY     VIDEO BRONCHOSCOPY WITH ENDOBRONCHIAL ULTRASOUND N/A 10/27/2022   Procedure: VIDEO BRONCHOSCOPY WITH ENDOBRONCHIAL ULTRASOUND;  Surgeon:  Alaine Vicenta NOVAK, MD;  Location: [facility] ENDOSCOPY;  Service: Cardiopulmonary;  Laterality: N/A;   Family History  Problem Relation Age of Onset   Arthritis Mother    Arthritis Father    Leukemia Father    Liver disease Brother    Diabetes Brother    Breast cancer Paternal Aunt    Breast cancer Daughter    Outpatient Medications Prior to Visit  Medication Sig Dispense Refill   Alpha Lipoic Acid 200 MG CAPS Take 200 mg by mouth daily.     Ascorbic Acid (VITAMIN C PO) Take 1 tablet by mouth daily.     b complex vitamins capsule Take 1 capsule by mouth daily.     Calcium-Phosphorus-Vitamin D (CITRACAL +D3 PO) Take 1 capsule by mouth daily.     COLLAGEN PO Take 1 Scoop by mouth daily. Coffee     diclofenac Sodium (VOLTAREN) 1 % GEL Apply 2 g topically at bedtime. Knee pain     Glycerin-Hypromellose-PEG 400 (DRY EYE RELIEF DROPS OP) Place 1 drop into both eyes 2 (two) times daily.     latanoprost (XALATAN) 0.005 % ophthalmic solution Place 1 drop into both eyes at bedtime.     lidocaine  4 % Place 1 patch onto the skin daily. 20 patch 1   methocarbamol  (ROBAXIN ) 500 MG tablet Take 1 tablet (500 mg total) by mouth every 8 (eight) hours as needed for muscle spasms. 30 tablet 0   Multiple Vitamin (MULTIVITAMIN ADULT PO) Take 1 tablet by mouth daily. Balance of nature     Turmeric Curcumin 500 MG CAPS Take 500 mg by mouth daily.     No facility-administered medications prior to visit.   Allergies  Allergen Reactions   Macrobid [Nitrofurantoin] Rash   Boniva  [Ibandronic Acid] Other (See Comments)    Muscle aches /headache   Gabapentin     Dizzy and fell when taking     Prednisone     Makes me feel weird    Tizanidine      Low bp   Tramadol  Itching   Ciprofloxacin     weird feeling    Codeine Itching and Rash   Plaquenil [Hydroxychloroquine] Rash   Sulfa Antibiotics Itching and Rash     Objective:   Today's Vitals   11/07/23 1035  BP: (!) 150/72  Pulse: 80  Temp: 98 F (36.7  C)  TempSrc: Temporal  SpO2: 99%  Weight: 166 lb 3.2 oz (75.4 kg)  Height: 5' 4 (1.626 m)   Body mass index is 28.53 kg/m.   General: Well developed, well nourished. No acute distress. Back: Straight. Pain on palpation over the upper right paralumbar muscles. There is a step-off int he spinal   processes at this level. Extremities: Strength 5/5. Decreased sensation over the right anterolateral thigh. Patellar reflex is 1+ on the   right and 2+ on the left. Ankle reflexes are 1+ bilat. Psych: Alert and oriented. Normal mood and affect.  There are no preventive care reminders to display for this patient.  Imaging: Thoracic spine x-ray (10/02/2023) IMPRESSION: No evidence of fracture or dislocation.   Mild multilevel degenerative changes.  Lumbar spine x-ray (10/02/2023) IMPRESSION: Prior vertebral augmentation  at L1.   No fracture or dislocation is seen.  MR Lumbar Spine wo contrast (07/16/2021) Disc levels:   T12- L1: Mild spondylosis   L1-L2: Mild disc bulging   L2-L3: Inferior foraminal disc bulging   L3-L4: Mild facet spurring and disc bulging.   L4-L5: Moderate facet spurring. Disc bulging eccentric to the right at the inferior foramen where there is annular fissure and protrusion.   L5-S1:Degenerative facet spurring with mild anterolisthesis. Mild disc space narrowing.   IMPRESSION: 1. Incomplete healing (continued marrow edema) at the L1 compression fracture with 40% height loss. 2. No new fracture or neural compression.    Assessment & Plan:   Problem List Items Addressed This Visit       Nervous and Auditory   Lumbar back pain with radiculopathy affecting right lower extremity - Primary   Ms. Mallin has lower back pain, now with radiculopathy symptoms. She has had a prior spinal compression fracture and been noted to have disc bulging. Her plain x-rays have not shown a source and she has failed conservative management. I will move ahead with an MRI  scan. In the meantime, will try increasing the dose of her ibuprofen , as she ahs an intolerance for tramadol  and codeine.      Relevant Medications   ibuprofen  (ADVIL ) 600 MG tablet   Other Relevant Orders   MR Lumbar Spine Wo Contrast    Return for Follow-up after testing/imaging.   Garnette CHRISTELLA Simpler, MD

## 2023-11-08 ENCOUNTER — Telehealth: Payer: Self-pay | Admitting: Family Medicine

## 2023-11-08 ENCOUNTER — Encounter (HOSPITAL_COMMUNITY): Payer: Self-pay

## 2023-11-08 ENCOUNTER — Emergency Department (HOSPITAL_COMMUNITY): Payer: Medicare PPO

## 2023-11-08 ENCOUNTER — Encounter: Payer: Self-pay | Admitting: Family Medicine

## 2023-11-08 ENCOUNTER — Other Ambulatory Visit: Payer: Self-pay

## 2023-11-08 ENCOUNTER — Emergency Department (HOSPITAL_COMMUNITY)
Admission: EM | Admit: 2023-11-08 | Discharge: 2023-11-08 | Disposition: A | Discharge status: Home / Self Care | Payer: Medicare PPO

## 2023-11-08 DIAGNOSIS — M47816 Spondylosis without myelopathy or radiculopathy, lumbar region: Secondary | ICD-10-CM | POA: Diagnosis not present

## 2023-11-08 DIAGNOSIS — M5136 Other intervertebral disc degeneration, lumbar region with discogenic back pain only: Secondary | ICD-10-CM | POA: Diagnosis not present

## 2023-11-08 DIAGNOSIS — M5126 Other intervertebral disc displacement, lumbar region: Secondary | ICD-10-CM | POA: Diagnosis not present

## 2023-11-08 DIAGNOSIS — M5416 Radiculopathy, lumbar region: Secondary | ICD-10-CM

## 2023-11-08 DIAGNOSIS — M545 Low back pain, unspecified: Secondary | ICD-10-CM | POA: Diagnosis not present

## 2023-11-08 DIAGNOSIS — S32009A Unspecified fracture of unspecified lumbar vertebra, initial encounter for closed fracture: Secondary | ICD-10-CM | POA: Diagnosis not present

## 2023-11-08 DIAGNOSIS — M5459 Other low back pain: Secondary | ICD-10-CM | POA: Diagnosis not present

## 2023-11-08 MED ORDER — LIDOCAINE 5 % EX PTCH
1.0000 | MEDICATED_PATCH | Freq: Once | CUTANEOUS | Status: DC
  Administered 2023-11-08: 1 via TRANSDERMAL
  Filled 2023-11-08: qty 1

## 2023-11-08 MED ORDER — TRAMADOL HCL 50 MG PO TABS
50.0000 mg | ORAL_TABLET | Freq: Once | ORAL | Status: AC
  Administered 2023-11-08: 50 mg via ORAL
  Filled 2023-11-08: qty 1

## 2023-11-08 MED ORDER — METHOCARBAMOL 500 MG PO TABS
500.0000 mg | ORAL_TABLET | Freq: Once | ORAL | Status: AC
  Administered 2023-11-08: 500 mg via ORAL
  Filled 2023-11-08: qty 1

## 2023-11-08 MED ORDER — TRAMADOL HCL 50 MG PO TABS: 50.0000 mg | ORAL_TABLET | Freq: Four times a day (QID) | ORAL | 0 refills | Status: DC | PRN

## 2023-11-08 MED ORDER — ACETAMINOPHEN 500 MG PO TABS
1000.0000 mg | ORAL_TABLET | Freq: Once | ORAL | Status: AC
  Administered 2023-11-08: 1000 mg via ORAL
  Filled 2023-11-08: qty 2

## 2023-11-08 MED ORDER — KETOROLAC TROMETHAMINE 10 MG PO TABS
10.0000 mg | ORAL_TABLET | Freq: Once | ORAL | Status: AC
  Administered 2023-11-08: 10 mg via ORAL
  Filled 2023-11-08: qty 1

## 2023-11-08 MED ORDER — LIDOCAINE 5 % EX PTCH: 1.0000 | MEDICATED_PATCH | CUTANEOUS | 0 refills | Status: DC

## 2023-11-08 NOTE — ED Notes (Signed)
 Will place Lido patch after pt has CT.  Radiology stated they would have to remove it for the CT.

## 2023-11-08 NOTE — ED Triage Notes (Signed)
 Patient has had right lower back pain for 2 months that has caused her right leg to feel numb 2 days ago. Has MRI scheduled tomorrow. Unable to get comfortable in any position. Able to control bowels. Denies falls.

## 2023-11-08 NOTE — Telephone Encounter (Signed)
 Patient seen in office yesterday for back pain. Patient is currently taking Tylenol  and Advil  with no relief from pain. Patient is requesting stronger pain medication.   Copied from CRM [id number]. Topic: Clinical - Medication Refill >> Nov 08, 2023  8:51 AM Viola FALCON wrote: Most Recent Primary Care Visit:  Provider: THEDORA GARNETTE HERO  Department: [facility]  Visit Type: OFFICE VISIT  Date: 11/07/2023  Medication: Meds for Lower back pain   Has the patient contacted their pharmacy? No (Agent: If no, request that the patient contact the pharmacy for the refill. If patient does not wish to contact the pharmacy document the reason why and proceed with request.) (Agent: If yes, when and what did the pharmacy advise?)  Is this the correct pharmacy for this prescription? Yes If no, delete pharmacy and type the correct one.  This is the patient's preferred pharmacy:   [facility] [place], [place] - [address] [place] [address] Phone: [phone] Fax: [phone]   Has the prescription been filled recently? No  Is the patient out of the medication? Yes  Has the patient been seen for an appointment in the last year OR does the patient have an upcoming appointment? Yes  Can we respond through MyChart? No, please call daughter  Agent: Please be advised that Rx refills may take up to 3 business days. We ask that you follow-up with your pharmacy.

## 2023-11-08 NOTE — Telephone Encounter (Signed)
 Patient daughter is calling regarding a referral for patient patient was seen at the hospital and a mri was done but the mri didn't show anything patient daughter would like a call back regarding this matter

## 2023-11-08 NOTE — ED Provider Notes (Signed)
 [place] EMERGENCY DEPARTMENT AT [facility] Provider Note   CSN: [id number] Arrival date & time: 11/08/23  1015     History  Chief Complaint  Patient presents with   Back Pain    Shelby Cardenas is a 83 y.o. female.  Is an 83 year old female presenting emergency department with low back pain.  She reports she typically does not have low back pain although she has had some on and off issues over the years.  She has seemingly had worsening pain to her mid back over the past couple months.  Reportedly tried muscle relaxers, Tylenol , ibuprofen  heat and ice with some temporizing improvement.  Pain seemingly worsened over the past 2 days.  Reports subjective paresthesia to her right upper thigh on the lateral aspect down to the knee.  This is a new symptom.  Denies any weakness, saddle anesthesia, bowel or bladder incontinence.  No trauma, no constitutional symptoms, no immunosuppression/chronic steroid use.  No fevers.  No history of IV drug use.  No history of cancer.   Back Pain      Home Medications Prior to Admission medications   Medication Sig Start Date End Date Taking? Authorizing Provider  traMADol  (ULTRAM ) 50 MG tablet Take 1 tablet (50 mg total) by mouth every 6 (six) hours as needed. 11/08/23  Yes Neysa Caron PARAS, DO  Alpha Lipoic Acid 200 MG CAPS Take 200 mg by mouth daily.    [provider]  Ascorbic Acid (VITAMIN C PO) Take 1 tablet by mouth daily.    [provider]  b complex vitamins capsule Take 1 capsule by mouth daily.    [provider]  Calcium-Phosphorus-Vitamin D (CITRACAL +D3 PO) Take 1 capsule by mouth daily.    [provider]  COLLAGEN PO Take 1 Scoop by mouth daily. Coffee    [provider]  diclofenac Sodium (VOLTAREN) 1 % GEL Apply 2 g topically at bedtime. Knee pain    [provider]  Glycerin-Hypromellose-PEG 400 (DRY EYE RELIEF DROPS OP) Place 1 drop into both eyes 2 (two) times daily.     [provider]  ibuprofen  (ADVIL ) 600 MG tablet Take 1 tablet (600 mg total) by mouth every 8 (eight) hours as needed. 11/07/23   Thedora Garnette HERO, MD  latanoprost (XALATAN) 0.005 % ophthalmic solution Place 1 drop into both eyes at bedtime. 12/15/15   [provider]  lidocaine  4 % Place 1 patch onto the skin daily. 10/01/23   Thedora Garnette HERO, MD  methocarbamol  (ROBAXIN ) 500 MG tablet Take 1 tablet (500 mg total) by mouth every 8 (eight) hours as needed for muscle spasms. 10/30/23   Billy Knee, FNP  Multiple Vitamin (MULTIVITAMIN ADULT PO) Take 1 tablet by mouth daily. Balance of nature    [provider]  Turmeric Curcumin 500 MG CAPS Take 500 mg by mouth daily.    [provider]      Allergies    Macrobid [nitrofurantoin], Boniva  [ibandronic acid], Gabapentin, Prednisone, Tizanidine , Tramadol , Ciprofloxacin, Codeine, Plaquenil [hydroxychloroquine], and Sulfa antibiotics    Review of Systems   Review of Systems  Musculoskeletal:  Positive for back pain.    Physical Exam Updated Vital Signs BP (!) 154/82 (BP Location: Right Arm)   Pulse 78   Temp 98.1 F (36.7 C) (Oral)   Resp 18   Ht 5' 4 (1.626 m)   Wt 73 kg   SpO2 100%   BMI 27.64 kg/m  Physical Exam Vitals and  nursing note reviewed.  Constitutional:      General: She is not in acute distress.    Appearance: She is not toxic-appearing.  HENT:     Head: Normocephalic.     Nose: Nose normal.     Mouth/Throat:     Mouth: Mucous membranes are moist.  Cardiovascular:     Rate and Rhythm: Normal rate and regular rhythm.  Pulmonary:     Effort: Pulmonary effort is normal.  Abdominal:     General: Abdomen is flat. There is no distension.  Musculoskeletal:     Comments: Patient with midline spinal tenderness at the thoracic lumbar junction at L1.  5 out of 5 plantarflexion dorsiflexion, knee extension.  Normal sensation in the lower extremities below the knee.  Does have some subjective  paresthesia to the right lateral thigh.  Skin:    General: Skin is warm.     Capillary Refill: Capillary refill takes less than 2 seconds.  Neurological:     Mental Status: She is alert and oriented to person, place, and time.  Psychiatric:        Mood and Affect: Mood normal.        Behavior: Behavior normal.     ED Results / Procedures / Treatments   Labs (all labs ordered are listed, but only abnormal results are displayed) Labs Reviewed - No data to display   EKG None  Radiology MR LUMBAR SPINE WO CONTRAST Result Date: 11/08/2023 CLINICAL DATA:  Compression fracture, lumbar. Increased pain over the last 3 days without known injury. EXAM: MRI LUMBAR SPINE WITHOUT CONTRAST TECHNIQUE: Multiplanar, multisequence MR imaging of the lumbar spine was performed. No intravenous contrast was administered. COMPARISON:  MR lumbar spine without contrast 07/22/2021 lumbar spine radiographs 10/02/2023. FINDINGS: Segmentation: 5 non rib-bearing lumbar type vertebral bodies are present. The lowest fully formed vertebral body is L5. Alignment: Slight retrolisthesis at L1-2 is stable. Minimal anterolisthesis at L5-S1 is stable. Vertebrae: Spinal augmentation is noted at L1. No acute fractures or edema are present. Type 2 Modic changes present on the right at L4-5. A hemangioma is present along the anterior inferior endplate of L3. Marrow signal and vertebral body heights are otherwise normal. Conus medullaris and cauda equina: Conus extends to the L1 level. Conus and cauda equina appear normal. Paraspinal and other soft tissues: Limited imaging the abdomen is unremarkable. There is no significant adenopathy. No solid organ lesions are present. Disc levels: T12-L1: Remote superior endplate fracture of L1 and slight retropulsed bone is stable. No significant stenosis is present. L1-2: Mild facet hypertrophy is present bilaterally. Disc desiccation is present without focal protrusion or stenosis. L2-3: A mild  disc bulging is present bilaterally. No focal disc protrusion or stenosis is present. L3-4: Mild disc bulging and facet hypertrophy is present. No focal stenosis is present. L4-5: A mild broad-based disc bulge is present. Moderate facet hypertrophy is noted bilaterally. No significant stenosis is present. L5-S1: Moderate facet hypertrophy is present bilaterally. No focal disc protrusion or stenosis is present. IMPRESSION: 1. Stable remote superior endplate fracture of L1 with spinal augmentation. 2. No acute fracture or edema. 3. 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 No significant stenosis or impingement. Electronically Signed   By: Lonni Necessary M.D.   On: 11/08/2023 14:20    Procedures Procedures    Medications Ordered in ED Medications  lidocaine  (LIDODERM ) 5 % 1 patch (has no administration in time range)  traMADol  (ULTRAM ) tablet 50 mg (has no administration  in time range)  methocarbamol  (ROBAXIN ) tablet 500 mg (500 mg Oral Given 11/08/23 1151)  ketorolac  (TORADOL ) tablet 10 mg (10 mg Oral Given 11/08/23 1206)  acetaminophen  (TYLENOL ) tablet 1,000 mg (1,000 mg Oral Given 11/08/23 1151)    ED Course/ Medical Decision Making/ A&P Clinical Course as of 11/08/23 1450  Thu Nov 08, 2023  1427 MR LUMBAR SPINE WO CONTRAST IMPRESSION: 1. Stable remote superior endplate fracture of L1 with spinal augmentation. 2. No acute fracture or edema. 3. 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 No significant stenosis or impingement.     [TY]  1438 Patient reports some improvement with pain medications.  She states she would like to try tramadol .  She has had it in the past that just made her itch.  She states that she would rather itch rather than hurt.  Given negative MRI will discharge with outpatient follow-up. [TY]    Clinical Course User Index [TY] Neysa Caron PARAS, DO                                 Medical Decision Making Send 83 year old  female presenting emergency department for back pain.  She is afebrile nontachycardic hemodynamic stable.  Physical exam reassuring; no weakness or motor deficits in extremities.  Pain seemingly subacute/chronic.  Will give multimodal pain medications and get MRI.See ED course for further MDM/disposition.   Amount and/or Complexity of Data Reviewed Independent Historian:     Details: Daughter at bedside reports that mother does not typically suffer from her back pain and symptoms seemingly greatly worsened in the past day or 2. External Data Reviewed:     Details: Per last PCP visit note : maging: Thoracic spine x-ray (10/02/2023) IMPRESSION: No evidence of fracture or dislocation.   Mild multilevel degenerative changes.   Lumbar spine x-ray (10/02/2023) IMPRESSION: Prior vertebral augmentation at L1.   No fracture or dislocation is seen.   MR Lumbar Spine wo contrast (07/16/2021) Disc levels:   T12- L1: Mild spondylosis   L1-L2: Mild disc bulging   L2-L3: Inferior foraminal disc bulging   L3-L4: Mild facet spurring and disc bulging.   L4-L5: Moderate facet spurring. Disc bulging eccentric to the right at the inferior foramen where there is annular fissure and protrusion.   L5-S1:Degenerative facet spurring with mild anterolisthesis. Mild disc space narrowing.   IMPRESSION: 1. Incomplete healing (continued marrow edema) at the L1 compression fracture with 40% height loss. 2. No new fracture or neural compression.  Labs:     Details: Considered labs, however suspect MSK etiology. Radiology: ordered. Decision-making details documented in ED Course.    Details: Per chart review patient was to have MRI done tomorrow.  Will get today.   Risk OTC drugs. Prescription drug management.          Final Clinical Impression(s) / ED Diagnoses Final diagnoses:  Bilateral low back pain without sciatica, unspecified chronicity    Rx / DC Orders ED Discharge Orders           Ordered    traMADol  (ULTRAM ) 50 MG tablet  Every 6 hours PRN        11/08/23 1440              Neysa Caron PARAS, DO 11/08/23 1450

## 2023-11-09 ENCOUNTER — Other Ambulatory Visit: Payer: Medicare PPO

## 2023-11-09 NOTE — Addendum Note (Signed)
 Addended by: Loyola Mast on: 11/09/2023 11:55 AM   Modules accepted: Orders

## 2023-11-09 NOTE — Telephone Encounter (Signed)
 Noted. Dm/cma

## 2023-11-14 ENCOUNTER — Other Ambulatory Visit: Payer: Self-pay | Admitting: Internal Medicine

## 2023-11-14 DIAGNOSIS — M546 Pain in thoracic spine: Secondary | ICD-10-CM

## 2023-11-14 DIAGNOSIS — M545 Low back pain, unspecified: Secondary | ICD-10-CM

## 2023-11-14 MED ORDER — METHOCARBAMOL 500 MG PO TABS: 500.0000 mg | ORAL_TABLET | Freq: Three times a day (TID) | ORAL | 0 refills | Status: DC | PRN

## 2023-11-14 NOTE — Telephone Encounter (Signed)
 Requesting: methocarbamol  (ROBAXIN ) 500 MG tablet  Last Visit: 10/02/2023 Next Visit: 01/09/2024 Last Refill: 10/30/2023  Please Advise

## 2023-11-15 ENCOUNTER — Ambulatory Visit: Payer: Medicare PPO | Admitting: Orthopedic Surgery

## 2023-11-15 ENCOUNTER — Other Ambulatory Visit (INDEPENDENT_AMBULATORY_CARE_PROVIDER_SITE_OTHER): Payer: Medicare PPO

## 2023-11-15 VITALS — Ht 64.0 in | Wt 161.0 lb

## 2023-11-15 DIAGNOSIS — M5416 Radiculopathy, lumbar region: Secondary | ICD-10-CM

## 2023-11-15 NOTE — Progress Notes (Signed)
Orthopedic Spine Surgery Office Note  Assessment: Patient is a 83 y.o. female with upper lumbar back pain.  No stenosis seen on MRI.  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 Discussed that the conservative treatments include:  -activity modification  -physical therapy  -over the counter pain medications  -medrol dosepak  -lumbar steroid injections -Patient has tried Tylenol, ibuprofen, physical therapy, tramadol -Recommended diagnostic/therapeutic ESI -I told her that her abdominal pain does not seem consistent with lumbar spine pathology as the lumbar nerve roots do not go in that dermatome -Patient should return to office on as-needed basis   Patient expressed understanding of the plan and all questions were answered to the patient's satisfaction.   ___________________________________________________________________________   History:  Patient is a 83 y.o. female who presents today for lumbar spine.  Patient has a history of a compression fracture in 2022.  Underwent kyphoplasty with an outside Care[employer] adviser.  Developed pain in her upper lumbar spine starting in September 2024.  There was no trauma or injury that preceded the onset of pain.  She said she is also noticed pain in her abdomen.  She feels it coming in on both sides of the abdomen.  She describes it as a squeezing type pain.  She notices the pain mostly when she is transitioning from one position to another.  For example, when going from sitting to standing.  She said she has some numbness in the right anterior thigh that started within the last week.  No pain rating into either lower extremity.  No other numbness or paresthesias.   Weakness: Denies Symptoms of imbalance: Denies Paresthesias and numbness: Yes, decrease sensation in  the right anterior thigh.  No other numbness or paresthesias Bowel or bladder incontinence: Denies Saddle anesthesia: Denies  Treatments tried: Tylenol, ibuprofen, physical therapy, tramadol  Review of systems: Denies fevers and chills, night sweats, unexplained weight loss.  Has had skin cancer.  Has had pain that wakes her at night  Past medical history: Overactive bladder Glaucoma GERD Allergic rhinitis Cystocele with prolapse SLE HLD  Allergies: Macrobid, Boniva, gabapentin, prednisone, tizanidine, tramadol, ciprofloxacin, codeine, Plaquenil, sulfa  Past surgical history:  Bronchial washings Carpal tunnel release Cataract surgery C-section Cholecystectomy Tonsillectomy Skin cancer excision L1 kyphoplasty  Social history: Denies use of nicotine product (smoking, vaping, patches, smokeless) Alcohol use: Denies Denies recreational drug use   Physical Exam:  BMI of 27.6  General: no acute distress, appears stated age Neurologic: alert, answering questions appropriately, following commands Respiratory: unlabored breathing on room air, symmetric chest rise Psychiatric: appropriate affect, normal cadence to speech   MSK (spine):  -Strength exam      Left  Right EHL    5/5  5/5 TA    5/5  5/5 GSC    5/5  5/5 Knee extension  5/5  5/5 Hip flexion   5/5  5/5  -Sensory exam    Sensation intact to light touch in L3-S1 nerve distributions of bilateral lower extremities  -Achilles DTR: 1/4 on the left, 1/4 on the right -Patellar tendon DTR: 1/4 on the left, 1/4 on the right  -Straight leg raise: Negative bilaterally -Clonus: no beats bilaterally  -Left hip exam: No pain to range of motion -Right hip exam: No pain to range of motion  -TTP over the upper lumbar spine around L1 especially on  the area of the right paraspinal muscles  Imaging: XRs of the lumbar spine from 11/15/2023 was independently reviewed and interpreted, showing no significant disc height  loss or degenerative changes. Chronic L1 compression with cement augmentation in the vertebral body. Focal kyphosis around the L1 vertebral body. No new fractures seen. No dislocation seen. No evidence of instability on flexion/extension views.   MRI of the lumbar spine from 11/08/2023 was independently reviewed and interpreted, showing chronic compression fracture deformity at L1 with cement augmentation within the vertebral body.  No significant central, lateral recess, foraminal stenosis.   Patient name: Shelby Cardenas Patient MRN: [id number] Date of visit: 11/15/23

## 2023-11-21 ENCOUNTER — Telehealth: Payer: Self-pay | Admitting: Family Medicine

## 2023-11-21 ENCOUNTER — Other Ambulatory Visit: Payer: Medicare PPO

## 2023-11-21 NOTE — Telephone Encounter (Signed)
 ERROR

## 2023-11-29 DIAGNOSIS — R1084 Generalized abdominal pain: Secondary | ICD-10-CM | POA: Diagnosis not present

## 2023-11-29 DIAGNOSIS — M545 Low back pain, unspecified: Secondary | ICD-10-CM | POA: Diagnosis not present

## 2023-11-29 DIAGNOSIS — R262 Difficulty in walking, not elsewhere classified: Secondary | ICD-10-CM | POA: Diagnosis not present

## 2023-12-04 DIAGNOSIS — Z83511 Family history of glaucoma: Secondary | ICD-10-CM | POA: Diagnosis not present

## 2023-12-04 DIAGNOSIS — R262 Difficulty in walking, not elsewhere classified: Secondary | ICD-10-CM | POA: Diagnosis not present

## 2023-12-04 DIAGNOSIS — R1084 Generalized abdominal pain: Secondary | ICD-10-CM | POA: Diagnosis not present

## 2023-12-04 DIAGNOSIS — Z961 Presence of intraocular lens: Secondary | ICD-10-CM | POA: Diagnosis not present

## 2023-12-04 DIAGNOSIS — H26491 Other secondary cataract, right eye: Secondary | ICD-10-CM | POA: Diagnosis not present

## 2023-12-04 DIAGNOSIS — H0100A Unspecified blepharitis right eye, upper and lower eyelids: Secondary | ICD-10-CM | POA: Diagnosis not present

## 2023-12-04 DIAGNOSIS — H43813 Vitreous degeneration, bilateral: Secondary | ICD-10-CM | POA: Diagnosis not present

## 2023-12-04 DIAGNOSIS — H18523 Epithelial (juvenile) corneal dystrophy, bilateral: Secondary | ICD-10-CM | POA: Diagnosis not present

## 2023-12-04 DIAGNOSIS — H04123 Dry eye syndrome of bilateral lacrimal glands: Secondary | ICD-10-CM | POA: Diagnosis not present

## 2023-12-04 DIAGNOSIS — H401132 Primary open-angle glaucoma, bilateral, moderate stage: Secondary | ICD-10-CM | POA: Diagnosis not present

## 2023-12-04 DIAGNOSIS — H0100B Unspecified blepharitis left eye, upper and lower eyelids: Secondary | ICD-10-CM | POA: Diagnosis not present

## 2023-12-04 DIAGNOSIS — M545 Low back pain, unspecified: Secondary | ICD-10-CM | POA: Diagnosis not present

## 2023-12-05 ENCOUNTER — Other Ambulatory Visit: Payer: Self-pay

## 2023-12-05 ENCOUNTER — Ambulatory Visit: Payer: Medicare PPO | Admitting: Physical Medicine and Rehabilitation

## 2023-12-05 DIAGNOSIS — M5416 Radiculopathy, lumbar region: Secondary | ICD-10-CM | POA: Diagnosis not present

## 2023-12-05 MED ORDER — METHYLPREDNISOLONE ACETATE 40 MG/ML IJ SUSP
40.0000 mg | Freq: Once | INTRAMUSCULAR | Status: AC
  Administered 2023-12-05: 40 mg

## 2023-12-05 NOTE — Progress Notes (Signed)
 Shelby Cardenas - 83 y.o. female MRN [id number]  Date of birth: 1941/03/24  Office Visit Note: Visit Date: 12/05/2023 PCP: Thedora Garnette HERO, MD Referred by: Georgina Ozell LABOR, MD  Subjective: Chief Complaint  Patient presents with   Lower Back - Pain   HPI:  Shelby Cardenas is a 83 y.o. female who comes in today at the request of Dr. Ozell Georgina for planned Right L1-2 Lumbar Interlaminar epidural steroid injection with fluoroscopic guidance.  The patient has failed conservative care including home exercise, medications, time and activity modification.  This injection will be diagnostic and hopefully therapeutic.  Please see requesting physician notes for further details and justification.   ROS Otherwise per HPI.  Assessment & Plan: Visit Diagnoses:    ICD-10-CM   1. Lumbar radiculopathy  M54.16 XR C-ARM NO REPORT    Epidural Steroid injection    methylPREDNISolone  acetate (DEPO-MEDROL ) injection 40 mg      Plan: No additional findings.   Meds & Orders:  Meds ordered this encounter  Medications   methylPREDNISolone  acetate (DEPO-MEDROL ) injection 40 mg    Orders Placed This Encounter  Procedures   XR C-ARM NO REPORT   Epidural Steroid injection    Follow-up: Return for Ozell Georgina, MD as scheduled.   Procedures: No procedures performed  Lumbar Epidural Steroid Injection - Interlaminar Approach with Fluoroscopic Guidance  Patient: Shelby Cardenas      Date of Birth: 1941-01-18 MRN: [id number] PCP: Thedora Garnette HERO, MD      Visit Date: 12/05/2023   Universal Protocol:     Consent Given By: the patient  Position: PRONE  Additional Comments: Vital signs were monitored before and after the procedure. Patient was prepped and draped in the usual sterile fashion. The correct patient, procedure, and site was verified.   Injection Procedure Details:   Procedure diagnoses: Lumbar radiculopathy [M54.16]   Meds Administered:  Meds ordered this encounter   Medications   methylPREDNISolone  acetate (DEPO-MEDROL ) injection 40 mg     Laterality: Right  Location/Site:  L1-2  Needle: 3.5 in., 20 ga. Tuohy  Needle Placement: Paramedian epidural  Findings:   -Comments: Excellent flow of contrast into the epidural space.  Procedure Details: Using a paramedian approach from the side mentioned above, the region overlying the inferior lamina was localized under fluoroscopic visualization and the soft tissues overlying this structure were infiltrated with 4 ml. of 1% Lidocaine  without Epinephrine. The Tuohy needle was inserted into the epidural space using a paramedian approach.   The epidural space was localized using loss of resistance along with counter oblique bi-planar fluoroscopic views.  After negative aspirate for air, blood, and CSF, a 2 ml. volume of Isovue-250 was injected into the epidural space and the flow of contrast was observed. Radiographs were obtained for documentation purposes.    The injectate was administered into the level noted above.   Additional Comments:  No complications occurred Dressing: 2 x 2 sterile gauze and Band-Aid    Post-procedure details: Patient was observed during the procedure. Post-procedure instructions were reviewed.  Patient left the clinic in stable condition.   Clinical History: MRI LUMBAR SPINE WITHOUT CONTRAST   TECHNIQUE: Multiplanar, multisequence MR imaging of the lumbar spine was performed. No intravenous contrast was administered.   COMPARISON:  MR lumbar spine without contrast 07/22/2021 lumbar spine radiographs 10/02/2023.   FINDINGS: Segmentation: 5 non rib-bearing lumbar type vertebral bodies are present. The lowest fully formed vertebral body is L5.   Alignment: Slight retrolisthesis at L1-2  is stable. Minimal anterolisthesis at L5-S1 is stable.   Vertebrae: Spinal augmentation is noted at L1. No acute fractures or edema are present. Type 2 Modic changes present on the  right at L4-5. A hemangioma is present along the anterior inferior endplate of L3. Marrow signal and vertebral body heights are otherwise normal.   Conus medullaris and cauda equina: Conus extends to the L1 level. Conus and cauda equina appear normal.   Paraspinal and other soft tissues: Limited imaging the abdomen is unremarkable. There is no significant adenopathy. No solid organ lesions are present.   Disc levels:   T12-L1: Remote superior endplate fracture of L1 and slight retropulsed bone is stable. No significant stenosis is present.   L1-2: Mild facet hypertrophy is present bilaterally. Disc desiccation is present without focal protrusion or stenosis.   L2-3: A mild disc bulging is present bilaterally. No focal disc protrusion or stenosis is present.   L3-4: Mild disc bulging and facet hypertrophy is present. No focal stenosis is present.   L4-5: A mild broad-based disc bulge is present. Moderate facet hypertrophy is noted bilaterally. No significant stenosis is present.   L5-S1: Moderate facet hypertrophy is present bilaterally. No focal disc protrusion or stenosis is present.   IMPRESSION: 1. Stable remote superior endplate fracture of L1 with spinal augmentation. 2. No acute fracture or edema. 3. 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 No significant stenosis or impingement.     Electronically Signed   By: Lonni Necessary M.D.   On: 11/08/2023 14:20     Objective:  VS:  HT:    WT:   BMI:     BP:   HR: bpm  TEMP: ( )  RESP:  Physical Exam Vitals and nursing note reviewed.  Constitutional:      General: She is not in acute distress.    Appearance: Normal appearance. She is not ill-appearing.  HENT:     Head: Normocephalic and atraumatic.     Right Ear: External ear normal.     Left Ear: External ear normal.  Eyes:     Extraocular Movements: Extraocular movements intact.  Cardiovascular:     Rate and Rhythm: Normal  rate.     Pulses: Normal pulses.  Pulmonary:     Effort: Pulmonary effort is normal. No respiratory distress.  Abdominal:     General: There is no distension.     Palpations: Abdomen is soft.  Musculoskeletal:        General: Tenderness present.     Cervical back: Neck supple.     Right lower leg: No edema.     Left lower leg: No edema.     Comments: Patient has good distal strength with no pain over the greater trochanters.  No clonus or focal weakness.  Skin:    Findings: No erythema, lesion or rash.  Neurological:     General: No focal deficit present.     Mental Status: She is alert and oriented to person, place, and time.     Sensory: No sensory deficit.     Motor: No weakness or abnormal muscle tone.     Coordination: Coordination normal.  Psychiatric:        Mood and Affect: Mood normal.        Behavior: Behavior normal.      Imaging: No results found.

## 2023-12-05 NOTE — Progress Notes (Signed)
 Functional Pain Scale - descriptive words and definitions  Uncomfortable (3)  Pain is present but can complete all ADL's/sleep is slightly affected and passive distraction only gives marginal relief. Mild range order  Average Pain 2 She isnt on BT  155/83 +Driver, -BT, -Dye Allergies.

## 2023-12-06 DIAGNOSIS — R262 Difficulty in walking, not elsewhere classified: Secondary | ICD-10-CM | POA: Diagnosis not present

## 2023-12-06 DIAGNOSIS — M545 Low back pain, unspecified: Secondary | ICD-10-CM | POA: Diagnosis not present

## 2023-12-06 DIAGNOSIS — R1084 Generalized abdominal pain: Secondary | ICD-10-CM | POA: Diagnosis not present

## 2023-12-12 ENCOUNTER — Encounter: Payer: Self-pay | Admitting: Internal Medicine

## 2023-12-12 ENCOUNTER — Ambulatory Visit: Payer: Medicare PPO | Admitting: Internal Medicine

## 2023-12-12 ENCOUNTER — Ambulatory Visit (HOSPITAL_BASED_OUTPATIENT_CLINIC_OR_DEPARTMENT_OTHER): Payer: Medicare PPO | Admitting: Internal Medicine

## 2023-12-12 VITALS — BP 128/64 | HR 98 | Ht 62.0 in | Wt 158.0 lb

## 2023-12-12 DIAGNOSIS — R059 Cough, unspecified: Secondary | ICD-10-CM

## 2023-12-12 DIAGNOSIS — R768 Other specified abnormal immunological findings in serum: Secondary | ICD-10-CM | POA: Diagnosis not present

## 2023-12-12 DIAGNOSIS — J849 Interstitial pulmonary disease, unspecified: Secondary | ICD-10-CM | POA: Diagnosis not present

## 2023-12-12 LAB — PULMONARY FUNCTION TEST
DL/VA % pred: 100 %
DL/VA: 4.14 ml/min/mmHg/L
DLCO cor % pred: 81 %
DLCO cor: 14.29 ml/min/mmHg
DLCO unc % pred: 81 %
DLCO unc: 14.29 ml/min/mmHg
FEF 25-75 Pre: 2.32 L/s
FEF2575-%Pred-Pre: 194 %
FEV1-%Pred-Pre: 98 %
FEV1-Pre: 1.66 L
FEV1FVC-%Pred-Pre: 116 %
FEV6-%Pred-Pre: 90 %
FEV6-Pre: 1.94 L
FEV6FVC-%Pred-Pre: 106 %
FVC-%Pred-Pre: 85 %
FVC-Pre: 1.94 L
Pre FEV1/FVC ratio: 86 %
Pre FEV6/FVC Ratio: 100 %

## 2023-12-12 NOTE — Patient Instructions (Signed)
Spirometry and DLCO Performed Today.

## 2023-12-12 NOTE — Patient Instructions (Addendum)
ICD-10-CM   1. ILD (interstitial lung disease) (HCC)  J84.9     2. Positive ANA (antinuclear antibody)  R76.8       Interstitial lung disease is stable for 4 years based on pulmonary function test.  Shortness of breath and cough is probably associated with this.  But it is stable.  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 Which tends to be more stable in your age group   Plan   - Take ILD questionnaire with you home and bring it back at next visit - Do spirometry and DLCO in 4 months - At the follow-up visit we will schedule a CT scan high-resolution for end of the year -At this point in time overall supportive care and expectant approach  -Holding off consideration for lung biopsy  -Holding of consideration for immunomodulators  -Holding off consideration for antifibrotic  -But all these options are back on the table if the disease gets worse -Always be up-to-date with respiratory vaccines, avoid exposures to sick people and smoke and pollution.  Follow-up - 30-minute visit in 4 months after spirometry and DLCO.

## 2023-12-12 NOTE — Progress Notes (Signed)
Spirometry and DLCO Performed Today.

## 2023-12-12 NOTE — Progress Notes (Signed)
06/06/2023 Follow up: ILD   83 year old female never smoker seen for pulmonary consult November 2023 for Pneumonia found to have ILD changes on CT chest  Covid Pneumonia 07/2022 patient        Patient returns for a 39-month follow-up.  Patient has a history of recurrent pneumonia.  Was seen in November 2023 for pulmonary consult after pneumonia and COVID-19 that developed in September 2023.  Patient was initially seen with significant shortness of breath, decreased activity tolerance and stamina.  During her workup autoimmune/CTD serology negative except for a positive ANA along with joint pains.  She was seen by rheumatology October 2023 with Dr. Dimple Casey.  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 Underwent Bronchoscopy that was unrevealing.  Since last visit patient says she is actually doing much better.  She is starting to walk on a consistent basis.  She has been able to build her stamina back up she is walking every day for 30 minutes.  She typically stops 1 time to rest and is able to complete her walk each day.  She says this is a big improvement since her last visit.  Her energy level has picked back up as well.  Her shortness of breath is also decreased.  She has been under quite a bit of stress.  Her husband passed away last week.  Patient denies any increased cough.  She does have an intermittent cough with clear mucus. Recent CT chest in June showed chronic interstitial changes without progression.  PFTs in July showed a slight drop in lung capacity with FVC going from 91% to 75% and diffusing capacity going from 77% to 75%. Patient is a never smoker.  Has no previous diagnosis of COPD or asthma.  Is not on any inhalers.  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 (Historical dx of discoid lupus) Grew up with wood  stove, worked on tobacco farm.   TEST/EVENTS : PFT November 02, 2022 showed FEV1 at 106%, ratio 86, FVC 91%, no significant bronchodilator response, DLCO at 77%. PFT May 23, 2023 showed FEV1 at 86%, ratio 85, FVC 75%, no significant bronchodilator response, DLCO 75%.  Serology-CTD/autoimmune labs neg except for ANA + Bronch 10/27/22-Path neg. Cytology neg for malignant cells. 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  OV 12/12/2023  Subjective:  Patient ID: Shelby Cardenas, female , DOB: 1941-02-13 , age 83 y.o. , MRN: [id number] , ADDRESS: [address] [place] [address] PCP Loyola Mast, MD Patient Care Team: Loyola Mast, MD as PCP - General (Family Medicine) Letta Kocher, MD (Rehabilitation) Adelfa Koh, MD as Referring Physician (Orthopedic Surgery) Shamleffer, Konrad Dolores, MD as Attending Physician (Endocrinology) Baldo Daub, MD as Consulting Physician (Cardiology) Fuller Plan, MD as Consulting Physician (Rheumatology)  This Provider for this visit: Treatment Team:  Attending Provider: Kalman Shan, MD    12/12/2023 -   Chief Complaint  Patient presents  with   Follow-up    PFT done today. She is coughing less and has occ clear, thick sputum.      HPI Shelby Cardenas 83 y.o. -presents for a new visit.  Transfer of care from Dr. Kendrick Fries to Dr. Marchelle Gearing.  She has ILD.  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  Her daughter who is Francine Graven mother is  also married to REESE who is the brother for Nehemiah Settle and sister-in-law to my late patient Cyndia Skeeters who died of NSIP and pulmonary fibrosis right at lung transplant at [facility].  She is here because she understands she is scar tissue.  In 2023 she did have bronchoscopy that was nondiagnostic by Dr. Kendrick Fries.  She is on supportive care and expectant approach she has some baseline mild shortness of breath with exertion and also chronic cough.  But otherwise stable.  She did have November 2024 CT angiogram chest for pulmonary embolism and this ILD looks stable and very similar to November 2023 that I personally visualized and showed her.  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  She had pulmonary function test for this visit that I reviewed and it shows 4-year stability   CT angiogram November 2024. IMPRESSION: 1.  No evidence of pulmonary embolism. 2. Interstitial lung disease is relatively similar to 04/04/2023 CT. Favor nonspecific interstitial pneumonitis. 3. Incidental findings, including: Coronary artery atherosclerosis. Aortic Atherosclerosis (ICD10-I70.0). Hepatic steatosis and probable hepatomegaly.     Electronically Signed   By: Jeronimo Greaves M.D.   On: 09/20/2023 14:55    Latest Reference Range & Units 08/09/22 08:58 10/04/22 13:57 10/04/22 13:59  Anti Nuclear Antibody (ANA) NEGATIVE   POSITIVE !   ANA Pattern 1   Nuclear, Speckled !   ANA Titer 1 titer  1:80 (H)   ANCA SCREEN Negative   Negative   Atypical pANCA Neg:<1:20 titer  <1:20   Cyclic Citrullin Peptide Ab UNITS  <16   ds DNA Ab IU/mL <1 <1   Cytoplasmic (C-ANCA) Neg:<1:20 titer  <1:20   P-ANCA Neg:<1:20 titer  <1:20   ENA SM Ab Ser-aCnc <1.0 NEG AI <1.0 NEG <1.0 NEG   Anti-Jo-1 Ab (RDL) <20 Units   <20  Anti-PL-7 Ab (RDL) Negative    Negative  Anti-PL-12 Ab (RDL) Negative    Negative  Anti-EJ Ab (RDL) Negative    Negative  Anti-OJ Ab (RDL) Negative    Negative  Anti-SRP Ab (RDL) Negative    Negative  Anti-Mi-2  Ab (RDL) Negative    Negative  Anti-TIF-1gamma Ab (RDL) <20 Units   <20  Anti-MDA-5 Ab (CADM-140)(RDL) <20 Units   <20  Anti-NXP-2 (P140) Ab (RDL) <20 Units   <20  Anti-SAE1 Ab, IgG (RDL) <20 Units   <20  Anti-PM/Scl-100 Ab (RDL) <20 Units   <20  Anti-Ku Ab (RDL) Negative    Negative  Anti-SS-A 52kD Ab, IgG (RDL) <20 Units   66 (H)  Anti-U1 RNP Ab (RDL) <20 Units   <20  Anti-U2 RNP Ab (RDL) Negative    Negative  Anti-U3 RNP (Fibrillarin)(RDL) Negative    Negative  Anti-MPO Antibodies 0.0 - 0.9 units  <0.2   Anti-PR3 Antibodies 0.0 - 0.9 units  <0.2   C3 Complement mg/dL 782    C4 Complement mg/dL 26    Ribonucleic Protein(ENA) Antibody, IgG <1.0 NEG AI <1.0 NEG <1.0 NEG   SSA (Ro) (ENA) Antibody, IgG <1.0 NEG AI  <1.0 NEG   SSB (La) (ENA) Antibody, IgG <  1.0 NEG AI  <1.0 NEG   Scleroderma (Scl-70) (ENA) Antibody, IgG <1.0 NEG AI  <1.0 NEG   !: Data is abnormal (H): Data is abnormally high  PFT     Latest Ref Rng & Units 12/12/2023   11:13 AM 05/23/2023    4:50 PM 11/02/2022    3:39 PM 05/23/2019    1:32 PM  PFT Results  FVC-Pre L 1.94  P  2.22    FVC-Predicted Pre % 85  P 75  90  75   FVC-Post L  1.87  2.24  1.87   FVC-Predicted Post %  76  91  76   Pre FEV1/FVC % % 86  P 85  84  85   Post FEV1/FCV % %  69  86  69   FEV1-Pre L 1.66  P 1.57  1.85  1.57   FEV1-Predicted Pre % 98  P 86  102  86   FEV1-Post L  1.30  1.93  1.30   DLCO uncorrected ml/min/mmHg 14.29  P 13.74  13.98  13.74   DLCO UNC% % 81  P 75  77  75   DLCO corrected ml/min/mmHg  13.74  13.98  13.74   DLCO COR %Predicted %  75  77  75   DLVA Predicted % 100  P 90  98  90   TLC L  5.24  4.35  5.24   TLC % Predicted %  106  88  106   RV % Predicted %  130  88  130     P Preliminary result       LAB RESULTS last 96 hours No results found.       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   reports that she has never smoked. She has been exposed to tobacco smoke. She has never used smokeless tobacco.  Past Surgical History:  Procedure Laterality Date   BRONCHIAL BIOPSY  10/27/2022   Procedure: BRONCHIAL BIOPSIES;  Surgeon: Lupita Leash, MD;  Location: [facility] ENDOSCOPY;  Service: Cardiopulmonary;;   BRONCHIAL NEEDLE ASPIRATION BIOPSY  10/27/2022   Procedure: BRONCHIAL NEEDLE ASPIRATION BIOPSIES;  Surgeon: Lupita Leash, MD;  Location: [facility] ENDOSCOPY;  Service: Cardiopulmonary;;   BRONCHIAL WASHINGS  10/27/2022   Procedure: BRONCHIAL WASHINGS;  Surgeon: Lupita Leash, MD;  Location: [facility] ENDOSCOPY;  Service: Cardiopulmonary;;   CESAREAN SECTION     CHOLECYSTECTOMY     EYE SURGERY     Bilateral Cataracts   HEMOSTASIS CONTROL  10/27/2022   Procedure: HEMOSTASIS CONTROL;  Surgeon: Lupita Leash, MD;  Location: [facility] ENDOSCOPY;  Service: Cardiopulmonary;;   KNEE SURGERY Left    NEUROPLASTY / TRANSPOSITION MEDIAN NERVE AT CARPAL TUNNEL  05/2021   Back   SKIN BIOPSY  10/05/2022   left upper arm   SKIN CANCER EXCISION     TONSILLECTOMY     VIDEO BRONCHOSCOPY WITH ENDOBRONCHIAL ULTRASOUND N/A 10/27/2022   Procedure: VIDEO BRONCHOSCOPY WITH ENDOBRONCHIAL ULTRASOUND;  Surgeon: Lupita Leash, MD;  Location: [facility] ENDOSCOPY;  Service: Cardiopulmonary;  Laterality: N/A;    Allergies   Allergen Reactions   Macrobid [Nitrofurantoin] Rash   Boniva [Ibandronate] Other (See Comments)    Muscle aches /headache   Gabapentin     Dizzy and fell when taking     Prednisone     Makes me feel weird    Tizanidine     Low bp   Tramadol Itching   Ciprofloxacin     weird feeling    Codeine Itching and Rash   Plaquenil [Hydroxychloroquine] Rash   Sulfa Antibiotics Itching and Rash    Immunization History  Administered Date(s) Administered   Fluad Quad(high Dose 65+) 09/13/2021   Fluad Trivalent(High Dose 65+) 07/11/2023   Influenza, High Dose Seasonal PF 09/05/2013, 08/17/2016, 08/13/2017   PFIZER(Purple Top)SARS-COV-2 Vaccination 11/15/2019, 12/06/2019   Pneumococcal Conjugate-13 05/05/2015   Pneumococcal Polysaccharide-23 03/05/2007   Td 01/13/2022   Td,absorbed, Preservative Free, Adult Use, Lf Unspecified 10/30/1996   Tdap 03/05/2007   Zoster Recombinant(Shingrix) 12/07/2021, 04/12/2022   Zoster, Live 10/20/2009    Family History  Problem Relation Age of Onset   Arthritis Mother    Arthritis Father    Leukemia Father    Liver disease Brother    Diabetes Brother    Breast cancer Paternal Aunt    Breast cancer Daughter      Current Outpatient Medications:    Alpha Lipoic Acid 200 MG CAPS, Take 200 mg by mouth daily., Disp: , Rfl:    Ascorbic Acid (VITAMIN C PO), Take 1 tablet by mouth daily., Disp: , Rfl:    b complex vitamins capsule, Take 1 capsule by mouth daily., Disp: , Rfl:    Calcium-Phosphorus-Vitamin D (CITRACAL +D3 PO), Take 1 capsule by mouth daily., Disp: , Rfl:    COLLAGEN PO, Take 1 Scoop by mouth daily. Coffee, Disp: , Rfl:    diclofenac Sodium (VOLTAREN) 1 % GEL, Apply 2 g topically at bedtime. Knee pain, Disp: , Rfl:    Glycerin-Hypromellose-PEG 400 (DRY EYE RELIEF DROPS OP), Place 1 drop into both eyes 2 (two) times daily., Disp: , Rfl:    ibuprofen (ADVIL) 600 MG tablet, Take 1 tablet (600 mg total) by mouth every 8 (eight) hours as  needed., Disp: 30 tablet, Rfl: 2   latanoprost (XALATAN) 0.005 % ophthalmic solution, Place 1 drop into both eyes at bedtime., Disp: , Rfl:    lidocaine (LIDODERM) 5 %, Place 1 patch onto the skin daily. Remove & Discard patch within 12 hours or as directed by MD, Disp: 14 patch, Rfl: 0   methocarbamol (ROBAXIN) 500 MG tablet, Take 1 tablet (500 mg total) by mouth every 8 (eight) hours as needed for muscle spasms., Disp: 30 tablet, Rfl: 0   Multiple Vitamin (MULTIVITAMIN ADULT PO), Take 1 tablet by mouth daily. Balance of nature, Disp: , Rfl:    traMADol (ULTRAM) 50 MG tablet, Take 1 tablet (50 mg total) by mouth every 6 (six) hours as needed., Disp: 15 tablet, Rfl: 0   Turmeric Curcumin 500 MG CAPS, Take 500 mg by mouth daily., Disp: , Rfl:   Current Facility-Administered Medications:    methylPREDNISolone acetate (DEPO-MEDROL) injection 40 mg, 40 mg, Other, Once,       Objective:   Vitals:   12/12/23 1328  BP: 128/64  Pulse: 98  SpO2: 96%  Weight: 158 lb (71.7 kg)  Height: 5\' 2"  (1.575 m)    Estimated body mass index is 28.9 kg/m as calculated from the  following:   Height as of this encounter: 5\' 2"  (1.575 m).   Weight as of this encounter: 158 lb (71.7 kg).  @WEIGHTCHANGE @  [employer]   12/12/23 1328  Weight: 158 lb (71.7 kg)     Physical Exam   General: No distress. Looks well O2 at rest: no Cane present: no Sitting in wheel chair: o Frail: no Obese: no Neuro: Alert and Oriented x 3. GCS 15. Speech normal Psych: Pleasant Resp:  Barrel Chest - non.  Wheeze - no, Crackles - yes base, No overt respiratory distress CVS: Normal heart sounds. Murmurs - no Ext: Stigmata of Connective Tissue Disease - no HEENT: Normal upper airway. PEERL +. No post nasal drip        Assessment:       ICD-10-CM   1. ILD (interstitial lung disease) (HCC)  J84.9     2. Positive ANA (antinuclear antibody)  R76.8          Plan:     Patient Instructions     ICD-10-CM    1. ILD (interstitial lung disease) (HCC)  J84.9     2. Positive ANA (antinuclear antibody)  R76.8       Interstitial lung disease is stable for 4 years based on pulmonary function test.  Shortness of breath and cough is probably associated with this.  But it is stable.  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 Which tends to be more stable in your age group   Plan   - Take ILD questionnaire with you home and bring it back at next visit - Do spirometry and DLCO in 4 months - At the follow-up visit we will schedule a CT scan high-resolution for end of the year -At this point in time overall supportive care and expectant approach  -Holding off consideration for lung biopsy  -Holding of consideration for immunomodulators  -Holding off consideration for antifibrotic  -But all these options are back on the table if the disease gets worse -Always be up-to-date with respiratory vaccines, avoid exposures to sick people and smoke and pollution.  Follow-up - 30-minute visit in 4 months after spirometry and DLCO.   FOLLOWUP Return in about 4 months (around 04/10/2024) for 30 min visit, after Cleda Daub and DLCO, with Dr Marchelle Gearing, ILD.    SIGNATURE    Dr. Kalman Shan, M.D., F.C.C.P,  Pulmonary and Critical Care Medicine Staff Physician, [facility] Center Director - Interstitial Lung Disease  Program  Pulmonary Fibrosis [facility] at [facility] [place], [place], [address]  Pager: [phone], If no answer or between  15:00h - 7:00h: call [phone] Telephone: [phone]  2:01 PM 12/12/2023

## 2023-12-13 DIAGNOSIS — R262 Difficulty in walking, not elsewhere classified: Secondary | ICD-10-CM | POA: Diagnosis not present

## 2023-12-13 DIAGNOSIS — M545 Low back pain, unspecified: Secondary | ICD-10-CM | POA: Diagnosis not present

## 2023-12-13 DIAGNOSIS — R1084 Generalized abdominal pain: Secondary | ICD-10-CM | POA: Diagnosis not present

## 2023-12-13 NOTE — Procedures (Signed)
 Lumbar Epidural Steroid Injection - Interlaminar Approach with Fluoroscopic Guidance  Patient: Shelby Cardenas      Date of Birth: 1941-10-03 MRN: [id number] PCP: Thedora Garnette HERO, MD      Visit Date: 12/05/2023   Universal Protocol:     Consent Given By: the patient  Position: PRONE  Additional Comments: Vital signs were monitored before and after the procedure. Patient was prepped and draped in the usual sterile fashion. The correct patient, procedure, and site was verified.   Injection Procedure Details:   Procedure diagnoses: Lumbar radiculopathy [M54.16]   Meds Administered:  Meds ordered this encounter  Medications   methylPREDNISolone  acetate (DEPO-MEDROL ) injection 40 mg     Laterality: Right  Location/Site:  L1-2  Needle: 3.5 in., 20 ga. Tuohy  Needle Placement: Paramedian epidural  Findings:   -Comments: Excellent flow of contrast into the epidural space.  Procedure Details: Using a paramedian approach from the side mentioned above, the region overlying the inferior lamina was localized under fluoroscopic visualization and the soft tissues overlying this structure were infiltrated with 4 ml. of 1% Lidocaine  without Epinephrine. The Tuohy needle was inserted into the epidural space using a paramedian approach.   The epidural space was localized using loss of resistance along with counter oblique bi-planar fluoroscopic views.  After negative aspirate for air, blood, and CSF, a 2 ml. volume of Isovue-250 was injected into the epidural space and the flow of contrast was observed. Radiographs were obtained for documentation purposes.    The injectate was administered into the level noted above.   Additional Comments:  No complications occurred Dressing: 2 x 2 sterile gauze and Band-Aid    Post-procedure details: Patient was observed during the procedure. Post-procedure instructions were reviewed.  Patient left the clinic in stable condition.

## 2023-12-18 DIAGNOSIS — R262 Difficulty in walking, not elsewhere classified: Secondary | ICD-10-CM | POA: Diagnosis not present

## 2023-12-18 DIAGNOSIS — M545 Low back pain, unspecified: Secondary | ICD-10-CM | POA: Diagnosis not present

## 2023-12-18 DIAGNOSIS — R1084 Generalized abdominal pain: Secondary | ICD-10-CM | POA: Diagnosis not present

## 2023-12-20 DIAGNOSIS — R1084 Generalized abdominal pain: Secondary | ICD-10-CM | POA: Diagnosis not present

## 2023-12-20 DIAGNOSIS — R262 Difficulty in walking, not elsewhere classified: Secondary | ICD-10-CM | POA: Diagnosis not present

## 2023-12-20 DIAGNOSIS — M545 Low back pain, unspecified: Secondary | ICD-10-CM | POA: Diagnosis not present

## 2023-12-25 DIAGNOSIS — R1084 Generalized abdominal pain: Secondary | ICD-10-CM | POA: Diagnosis not present

## 2023-12-25 DIAGNOSIS — R262 Difficulty in walking, not elsewhere classified: Secondary | ICD-10-CM | POA: Diagnosis not present

## 2023-12-25 DIAGNOSIS — M545 Low back pain, unspecified: Secondary | ICD-10-CM | POA: Diagnosis not present

## 2023-12-27 DIAGNOSIS — M545 Low back pain, unspecified: Secondary | ICD-10-CM | POA: Diagnosis not present

## 2023-12-27 DIAGNOSIS — R262 Difficulty in walking, not elsewhere classified: Secondary | ICD-10-CM | POA: Diagnosis not present

## 2023-12-27 DIAGNOSIS — R1084 Generalized abdominal pain: Secondary | ICD-10-CM | POA: Diagnosis not present

## 2024-01-01 DIAGNOSIS — M545 Low back pain, unspecified: Secondary | ICD-10-CM | POA: Diagnosis not present

## 2024-01-01 DIAGNOSIS — R1084 Generalized abdominal pain: Secondary | ICD-10-CM | POA: Diagnosis not present

## 2024-01-01 DIAGNOSIS — R262 Difficulty in walking, not elsewhere classified: Secondary | ICD-10-CM | POA: Diagnosis not present

## 2024-01-03 DIAGNOSIS — R262 Difficulty in walking, not elsewhere classified: Secondary | ICD-10-CM | POA: Diagnosis not present

## 2024-01-03 DIAGNOSIS — M545 Low back pain, unspecified: Secondary | ICD-10-CM | POA: Diagnosis not present

## 2024-01-03 DIAGNOSIS — R1084 Generalized abdominal pain: Secondary | ICD-10-CM | POA: Diagnosis not present

## 2024-01-08 DIAGNOSIS — R262 Difficulty in walking, not elsewhere classified: Secondary | ICD-10-CM | POA: Diagnosis not present

## 2024-01-08 DIAGNOSIS — R1084 Generalized abdominal pain: Secondary | ICD-10-CM | POA: Diagnosis not present

## 2024-01-08 DIAGNOSIS — M545 Low back pain, unspecified: Secondary | ICD-10-CM | POA: Diagnosis not present

## 2024-01-09 ENCOUNTER — Ambulatory Visit: Payer: Medicare PPO | Admitting: Family Medicine

## 2024-01-10 DIAGNOSIS — M545 Low back pain, unspecified: Secondary | ICD-10-CM | POA: Diagnosis not present

## 2024-01-10 DIAGNOSIS — R1084 Generalized abdominal pain: Secondary | ICD-10-CM | POA: Diagnosis not present

## 2024-01-10 DIAGNOSIS — R262 Difficulty in walking, not elsewhere classified: Secondary | ICD-10-CM | POA: Diagnosis not present

## 2024-01-15 DIAGNOSIS — M545 Low back pain, unspecified: Secondary | ICD-10-CM | POA: Diagnosis not present

## 2024-01-15 DIAGNOSIS — R262 Difficulty in walking, not elsewhere classified: Secondary | ICD-10-CM | POA: Diagnosis not present

## 2024-01-15 DIAGNOSIS — R1084 Generalized abdominal pain: Secondary | ICD-10-CM | POA: Diagnosis not present

## 2024-01-16 ENCOUNTER — Encounter: Payer: Self-pay | Admitting: Family Medicine

## 2024-01-16 ENCOUNTER — Ambulatory Visit: Payer: Medicare PPO | Admitting: Family Medicine

## 2024-01-16 VITALS — BP 126/80 | HR 80 | Temp 97.7°F | Ht 62.0 in | Wt 156.0 lb

## 2024-01-16 DIAGNOSIS — R198 Other specified symptoms and signs involving the digestive system and abdomen: Secondary | ICD-10-CM | POA: Diagnosis not present

## 2024-01-16 DIAGNOSIS — N390 Urinary tract infection, site not specified: Secondary | ICD-10-CM | POA: Diagnosis not present

## 2024-01-16 DIAGNOSIS — R1011 Right upper quadrant pain: Secondary | ICD-10-CM

## 2024-01-16 DIAGNOSIS — S300XXA Contusion of lower back and pelvis, initial encounter: Secondary | ICD-10-CM

## 2024-01-16 DIAGNOSIS — W19XXXA Unspecified fall, initial encounter: Secondary | ICD-10-CM

## 2024-01-16 DIAGNOSIS — B9620 Unspecified Escherichia coli [E. coli] as the cause of diseases classified elsewhere: Secondary | ICD-10-CM | POA: Diagnosis not present

## 2024-01-16 LAB — COMPREHENSIVE METABOLIC PANEL
ALT: 16 U/L (ref 0–35)
AST: 20 U/L (ref 0–37)
Albumin: 4.5 g/dL (ref 3.5–5.2)
Alkaline Phosphatase: 76 U/L (ref 39–117)
BUN: 12 mg/dL (ref 6–23)
CO2: 29 meq/L (ref 19–32)
Calcium: 9.4 mg/dL (ref 8.4–10.5)
Chloride: 99 meq/L (ref 96–112)
Creatinine, Ser: 0.6 mg/dL (ref 0.40–1.20)
GFR: 83.57 mL/min (ref >60.00)
Glucose, Bld: 97 mg/dL (ref 70–99)
Potassium: 3.8 meq/L (ref 3.5–5.1)
Sodium: 135 meq/L (ref 135–145)
Total Bilirubin: 0.4 mg/dL (ref 0.2–1.2)
Total Protein: 7.3 g/dL (ref 6.0–8.3)

## 2024-01-16 LAB — CBC WITH DIFFERENTIAL/PLATELET
Basophils Absolute: 0 10*3/uL (ref 0.0–0.1)
Basophils Relative: 0.5 % (ref 0.0–3.0)
Eosinophils Absolute: 0.2 10*3/uL (ref 0.0–0.7)
Eosinophils Relative: 3.8 % (ref 0.0–5.0)
HCT: 39.4 % (ref 36.0–46.0)
Hemoglobin: 13.2 g/dL (ref 12.0–15.0)
Lymphocytes Relative: 36.5 % (ref 12.0–46.0)
Lymphs Abs: 2.2 10*3/uL (ref 0.7–4.0)
MCHC: 33.4 g/dL (ref 30.0–36.0)
MCV: 94.2 fl (ref 78.0–100.0)
Monocytes Absolute: 0.7 10*3/uL (ref 0.1–1.0)
Monocytes Relative: 11.1 % (ref 3.0–12.0)
Neutro Abs: 2.9 10*3/uL (ref 1.4–7.7)
Neutrophils Relative %: 48.1 % (ref 43.0–77.0)
Platelets: 271 10*3/uL (ref 150.0–400.0)
RBC: 4.18 Mil/uL (ref 3.87–5.11)
RDW: 12.8 % (ref 11.5–15.5)
WBC: 6 10*3/uL (ref 4.0–10.5)

## 2024-01-16 LAB — LIPASE: Lipase: 38 U/L (ref 11.0–59.0)

## 2024-01-16 NOTE — Progress Notes (Signed)
 [facility] [address] [place] [address] Dept: [phone] Dept Fax: [phone]  Office Visit  Subjective:    Patient ID: Shelby Cardenas, female    DOB: 03/27/1941, 83 y.o..   MRN: [id number]  Chief Complaint  Patient presents with   Follow-up    3 month f/u.  C/o having abdominal swelling and still having some back pain.  Fell on tailbone x 11/29/23.     History of Present Illness:  Patient is in today for routine follow-up, however, she brings up two more acute issues.  Shelby Cardenas notes she had a fall about 6 weeks ago. She was going to sit down on a box in her closet to put shoes on. She lost her grip on a grab bar she uses and suddenly fell on the edge of the box and then the floor. she has primarily had pain at the site where she clipped the box. She has had some chronic low back pain due to a prior compression fracture and some degenerative disc issues. The fall appears to have stirred this up. She is gradually having improvement.  Shelby Cardenas also complains of an increased abdominal girth and a general sense of discomfort. She typically has to be careful about what she eats for breakfast, as she finds not everything sits well on her stomach. Her lunch is her largest meal of the day. She tends to eat a very light supper. She is not having vomiting. She denies any constipation or diarrhea. She does take a fiber supplement. She denies any urinary tract changes.  Past Medical History: Patient Active Problem List   Diagnosis Date Noted   Lumbar back pain with radiculopathy affecting right lower extremity 11/07/2023   ILD (interstitial lung disease) (HCC) 06/07/2023   Right posterior capsular opacification 03/20/2023   Pseudophakia, both eyes 03/20/2023   Posterior vitreous detachment of both eyes 03/20/2023   Dry eye syndrome of both eyes 03/20/2023   Osteoarthritis of right knee 07/23/2022   Osteoarthritis of left  knee 07/23/2022   GERD (gastroesophageal reflux disease) 06/06/2022   Lupus 06/06/2022   Positive ANA (antinuclear antibody) 10/17/2021   Incontinence of feces 10/12/2021   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   Osteoarthritis of hands, bilateral 10/12/2021   Intervertebral disc disorder with radiculopathy of lumbar region 09/06/2021   Closed wedge compression fracture of first lumbar vertebra (HCC) 06/09/2021   OAB (overactive bladder) 11/12/2020   Primary open angle glaucoma of both eyes, moderate stage 12/12/2018   Mammogram abnormal 07/16/2018   Cystocele with prolapse 07/02/2018   Adenomatous colon polyp 04/11/2016   Mixed hyperlipidemia 02/06/2016   Osteopenia 02/06/2016   Diaphragmatic hernia 02/06/2016   Allergic rhinitis 02/06/2016   Past Surgical History:  Procedure Laterality Date   BRONCHIAL BIOPSY  10/27/2022   Procedure: BRONCHIAL BIOPSIES;  Surgeon: Lupita Leash, MD;  Location: [facility] ENDOSCOPY;  Service: Cardiopulmonary;;   BRONCHIAL NEEDLE ASPIRATION BIOPSY  10/27/2022   Procedure: BRONCHIAL NEEDLE ASPIRATION BIOPSIES;  Surgeon: Lupita Leash, MD;  Location: [facility];  Service: Cardiopulmonary;;   BRONCHIAL WASHINGS  10/27/2022   Procedure: BRONCHIAL WASHINGS;  Surgeon: Lupita Leash, MD;  Location: [facility]OS[facility];  Service: Cardiopulmonary;;   CESAREAN SECTION     CHOLECYSTECTOMY     EYE SURGERY     Bilateral Cataracts   HEMOSTASIS CONTROL  10/27/2022   Procedure: HEMOSTASIS CONTROL;  Surgeon: Lupita Leash, MD;  Location: [facility] ENDOSCOPY;  Service: Cardiopulmonary;;  KNEE SURGERY Left    NEUROPLASTY / TRANSPOSITION MEDIAN NERVE AT CARPAL TUNNEL  05/2021   Back   SKIN BIOPSY  10/05/2022   left upper arm   SKIN CANCER EXCISION     TONSILLECTOMY     VIDEO BRONCHOSCOPY WITH ENDOBRONCHIAL ULTRASOUND N/A 10/27/2022   Procedure: VIDEO BRONCHOSCOPY WITH ENDOBRONCHIAL ULTRASOUND;  Surgeon: Lupita Leash, MD;  Location: [facility]  ENDOSCOPY;  Service: Cardiopulmonary;  Laterality: N/A;   Family History  Problem Relation Age of Onset   Arthritis Mother    Arthritis Father    Leukemia Father    Liver disease Brother    Diabetes Brother    Breast cancer Paternal Aunt    Breast cancer Daughter    Outpatient Medications Prior to Visit  Medication Sig Dispense Refill   Alpha Lipoic Acid 200 MG CAPS Take 200 mg by mouth daily.     Ascorbic Acid (VITAMIN C PO) Take 1 tablet by mouth daily.     b complex vitamins capsule Take 1 capsule by mouth daily.     Calcium-Phosphorus-Vitamin D (CITRACAL +D3 PO) Take 1 capsule by mouth daily.     COLLAGEN PO Take 1 Scoop by mouth daily. Coffee     diclofenac Sodium (VOLTAREN) 1 % GEL Apply 2 g topically at bedtime. Knee pain     Glycerin-Hypromellose-PEG 400 (DRY EYE RELIEF DROPS OP) Place 1 drop into both eyes 2 (two) times daily.     ibuprofen (ADVIL) 600 MG tablet Take 1 tablet (600 mg total) by mouth every 8 (eight) hours as needed. 30 tablet 2   latanoprost (XALATAN) 0.005 % ophthalmic solution Place 1 drop into both eyes at bedtime.     lidocaine (LIDODERM) 5 % Place 1 patch onto the skin daily. Remove & Discard patch within 12 hours or as directed by MD 14 patch 0   methocarbamol (ROBAXIN) 500 MG tablet Take 1 tablet (500 mg total) by mouth every 8 (eight) hours as needed for muscle spasms. 30 tablet 0   Multiple Vitamin (MULTIVITAMIN ADULT PO) Take 1 tablet by mouth daily. Balance of nature     traMADol (ULTRAM) 50 MG tablet Take 1 tablet (50 mg total) by mouth every 6 (six) hours as needed. 15 tablet 0   Turmeric Curcumin 500 MG CAPS Take 500 mg by mouth daily.     No facility-administered medications prior to visit.   Allergies  Allergen Reactions   Macrobid [Nitrofurantoin] Rash   Boniva [Ibandronate] Other (See Comments)    Muscle aches /headache   Gabapentin     Dizzy and fell when taking     Prednisone     Makes me feel weird    Tizanidine     Low bp    Tramadol Itching   Ciprofloxacin     weird feeling    Codeine Itching and Rash   Plaquenil [Hydroxychloroquine] Rash   Sulfa Antibiotics Itching and Rash     Objective:   Today's Vitals   01/16/24 0806  BP: 126/80  Pulse: 80  Temp: 97.7 F (36.5 C)  TempSrc: Temporal  SpO2: 99%  Weight: 156 lb (70.8 kg)  Height: 5\' 2"  (1.575 m)   Body mass index is 28.53 kg/m.   General: Well developed, well nourished. No acute distress. Abdomen: Soft. Bowel sounds positive, normal pitch and frequency. No hepatosplenomegaly. No abdominal   masses. There is some point tenderness in the right upper quadrant. No rebound or guarding. Back: Straight. Mild discomfort indicated over  the upper paraspinal muscles. Psych: Alert and oriented. Normal mood and affect.  Health Maintenance Due  Topic Date Due   Medicare Annual Wellness (AWV)  02/05/2024     Assessment & Plan:   Problem List Items Addressed This Visit   None Visit Diagnoses       Fall, initial encounter    -  Primary   Accidental fall. Discussed ways she is workign to prevent this occuring again.     Contusion of buttock, initial encounter       Resolving. I suspect these will improve with time. We will manage expectantly.     Increased abdominal girth       There are no palpable abdominal masses. It is unclear what may be causing this. I will check an abdominal ultrasound to assess further.   Relevant Orders   US Abdomen Complete     RUQ abdominal pain       There is some RUQ tenderness, thoguh she has had a priro cholecystectomy. I will check labs and an abdominal ultrasound to assess.   Relevant Orders   CBC with Differential/Platelet   Comprehensive metabolic panel   Urinalysis w microscopic + reflex cultur   Lipase   US Abdomen Complete       Return for Follow-up after testing/imaging.   Loyola Mast, MD

## 2024-01-17 DIAGNOSIS — M545 Low back pain, unspecified: Secondary | ICD-10-CM | POA: Diagnosis not present

## 2024-01-17 DIAGNOSIS — R262 Difficulty in walking, not elsewhere classified: Secondary | ICD-10-CM | POA: Diagnosis not present

## 2024-01-17 DIAGNOSIS — R1084 Generalized abdominal pain: Secondary | ICD-10-CM | POA: Diagnosis not present

## 2024-01-18 LAB — URINALYSIS W MICROSCOPIC + REFLEX CULTURE
Bilirubin Urine: NEGATIVE
Glucose, UA: NEGATIVE
Hgb urine dipstick: NEGATIVE
Ketones, ur: NEGATIVE
Nitrites, Initial: POSITIVE — AB
Protein, ur: NEGATIVE
Specific Gravity, Urine: 1.008 (ref 1.001–1.035)
pH: 7 (ref 5.0–8.0)

## 2024-01-18 LAB — URINE CULTURE
MICRO NUMBER:: 16222697
SPECIMEN QUALITY:: ADEQUATE

## 2024-01-18 LAB — CULTURE INDICATED

## 2024-01-18 MED ORDER — AMOXICILLIN-POT CLAVULANATE 500-125 MG PO TABS: 1.0000 | ORAL_TABLET | Freq: Two times a day (BID) | ORAL | 0 refills | Status: DC

## 2024-01-18 NOTE — Addendum Note (Signed)
 Addended by: Loyola Mast on: 01/18/2024 08:07 AM   Modules accepted: Orders

## 2024-01-22 DIAGNOSIS — R1084 Generalized abdominal pain: Secondary | ICD-10-CM | POA: Diagnosis not present

## 2024-01-22 DIAGNOSIS — R262 Difficulty in walking, not elsewhere classified: Secondary | ICD-10-CM | POA: Diagnosis not present

## 2024-01-22 DIAGNOSIS — M355 Multifocal fibrosclerosis: Secondary | ICD-10-CM | POA: Diagnosis not present

## 2024-01-23 ENCOUNTER — Ambulatory Visit (HOSPITAL_BASED_OUTPATIENT_CLINIC_OR_DEPARTMENT_OTHER)

## 2024-01-24 ENCOUNTER — Encounter: Payer: Self-pay | Admitting: Family Medicine

## 2024-01-24 ENCOUNTER — Ambulatory Visit (HOSPITAL_BASED_OUTPATIENT_CLINIC_OR_DEPARTMENT_OTHER)
Admission: RE | Admit: 2024-01-24 | Discharge: 2024-01-24 | Disposition: A | Discharge status: Home / Self Care | Source: Ambulatory Visit | Attending: Family Medicine | Admitting: Family Medicine

## 2024-01-24 DIAGNOSIS — R262 Difficulty in walking, not elsewhere classified: Secondary | ICD-10-CM | POA: Diagnosis not present

## 2024-01-24 DIAGNOSIS — R140 Abdominal distension (gaseous): Secondary | ICD-10-CM | POA: Diagnosis not present

## 2024-01-24 DIAGNOSIS — R1084 Generalized abdominal pain: Secondary | ICD-10-CM | POA: Diagnosis not present

## 2024-01-24 DIAGNOSIS — N281 Cyst of kidney, acquired: Secondary | ICD-10-CM | POA: Diagnosis not present

## 2024-01-24 DIAGNOSIS — R1011 Right upper quadrant pain: Secondary | ICD-10-CM | POA: Diagnosis not present

## 2024-01-24 DIAGNOSIS — R198 Other specified symptoms and signs involving the digestive system and abdomen: Secondary | ICD-10-CM | POA: Diagnosis not present

## 2024-01-24 DIAGNOSIS — K760 Fatty (change of) liver, not elsewhere classified: Secondary | ICD-10-CM | POA: Diagnosis not present

## 2024-01-24 DIAGNOSIS — M545 Low back pain, unspecified: Secondary | ICD-10-CM | POA: Diagnosis not present

## 2024-02-11 ENCOUNTER — Ambulatory Visit (INDEPENDENT_AMBULATORY_CARE_PROVIDER_SITE_OTHER): Payer: Medicare PPO

## 2024-02-11 VITALS — BP 126/62 | HR 75 | Temp 98.1°F | Ht 62.0 in | Wt 156.2 lb

## 2024-02-11 DIAGNOSIS — Z0000 Encounter for general adult medical examination without abnormal findings: Secondary | ICD-10-CM

## 2024-02-11 NOTE — Patient Instructions (Signed)
 [name] , Thank you for taking time to come for your Medicare Wellness Visit. I appreciate your ongoing commitment to your health goals. Please review the following plan we discussed and let me know if I can assist you in the future.   Referrals/Orders/Follow-Ups/Clinician Recommendations: none  This is a list of the screening recommended for you and due dates:  Health Maintenance  Topic Date Due   COVID-19 Vaccine (3 - 2024-25 season) 07/01/2023   Flu Shot  05/30/2024   Medicare Annual Wellness Visit  02/10/2025   DTaP/Tdap/Td vaccine (3 - Td or Tdap) 01/14/2032   Pneumonia Vaccine  Completed   DEXA scan (bone density measurement)  Completed   Zoster (Shingles) Vaccine  Completed   HPV Vaccine  Aged Out   Meningitis B Vaccine  Aged Out    Advanced directives: (Copy Requested) Please bring a copy of your health care power of attorney and living will to the office to be added to your chart at your convenience. You can mail to [facility] [address] [place], [place] [address] or email to [email or web address]  Next Medicare Annual Wellness Visit scheduled for next year: Yes  insert Preventive Care attachment Insert FALL PREVENTION attachment if needed

## 2024-02-11 NOTE — Progress Notes (Signed)
 Subjective:   Sumner Boesch is a 83 y.o. who presents for a Medicare Wellness preventive visit.  Visit Complete: In person    Persons Participating in Visit: Patient.  AWV Questionnaire: No: Patient Medicare AWV questionnaire was not completed prior to this visit.  Cardiac Risk Factors include: advanced age (>5men, >48 women);dyslipidemia     Objective:    Today's Vitals   02/11/24 1059 02/11/24 1100  BP: 126/62   Pulse: 75   Temp: 98.1 F (36.7 C)   TempSrc: Oral   SpO2: 95%   Weight: 156 lb 3.2 oz (70.9 kg)   Height: 5\' 2"  (1.575 m)   PainSc:  4    Body mass index is 28.57 kg/m.     02/11/2024   11:10 AM 11/08/2023   10:26 AM 02/05/2023   10:51 AM 10/27/2022    6:22 AM 10/25/2022    4:31 PM 02/01/2022   10:35 AM 01/25/2021   10:59 AM  Advanced Directives  Does Patient Have a Medical Advance Directive? Yes Yes Yes Yes Yes Yes Yes  Type of Estate agent of [place];Living will Healthcare Power of [place];Living will Healthcare Power of [place];Living will Healthcare Power of [place];Living will Healthcare Power of [place];Living will Healthcare Power of [place];Living will Healthcare Power of [place];Living will  Does patient want to make changes to medical advance directive?     No - Patient declined    Copy of Healthcare Power of Attorney in Chart? No - copy requested  No - copy requested No - copy requested No - copy requested No - copy requested No - copy requested    Current Medications (verified) Outpatient Encounter Medications as of 02/11/2024  Medication Sig   Alpha Lipoic Acid 200 MG CAPS Take 200 mg by mouth daily.   Ascorbic Acid (VITAMIN C PO) Take 1 tablet by mouth daily.   b complex vitamins capsule Take 1 capsule by mouth daily.   Calcium-Phosphorus-Vitamin D (CITRACAL +D3 PO) Take 1 capsule by mouth daily.   COLLAGEN PO Take 1 Scoop by mouth daily. Coffee   diclofenac Sodium (VOLTAREN) 1 % GEL Apply 2 g topically at  bedtime. Knee pain   Glycerin-Hypromellose-PEG 400 (DRY EYE RELIEF DROPS OP) Place 1 drop into both eyes 2 (two) times daily.   ibuprofen (ADVIL) 600 MG tablet Take 1 tablet (600 mg total) by mouth every 8 (eight) hours as needed.   latanoprost (XALATAN) 0.005 % ophthalmic solution Place 1 drop into both eyes at bedtime.   lidocaine (LIDODERM) 5 % Place 1 patch onto the skin daily. Remove & Discard patch within 12 hours or as directed by MD   Multiple Vitamin (MULTIVITAMIN ADULT PO) Take 1 tablet by mouth daily. Balance of nature   Probiotic Product (PROBIOTIC DAILY PO) Take 1 tablet by mouth daily.   Turmeric Curcumin 500 MG CAPS Take 500 mg by mouth daily.   amoxicillin-clavulanate (AUGMENTIN) 500-125 MG tablet Take 1 tablet by mouth in the morning and at bedtime. (Patient not taking: Reported on 02/11/2024)   methocarbamol (ROBAXIN) 500 MG tablet Take 1 tablet (500 mg total) by mouth every 8 (eight) hours as needed for muscle spasms. (Patient not taking: Reported on 02/11/2024)   traMADol (ULTRAM) 50 MG tablet Take 1 tablet (50 mg total) by mouth every 6 (six) hours as needed. (Patient not taking: Reported on 02/11/2024)   No facility-administered encounter medications on file as of 02/11/2024.    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 2014: Fracture fibula 2017: -1.8 2019: -1.4 2022: L1 compression fracture   Positive ANA (antinuclear antibody) 10/17/2021   09-2021: 1:40, speckled pattern   Primary open angle glaucoma of both eyes, moderate stage 12/12/2018   Past Surgical History:  Procedure Laterality Date   BRONCHIAL BIOPSY  10/27/2022   Procedure: BRONCHIAL BIOPSIES;  Surgeon: Lupita Leash, MD;  Location: [facility] ENDOS[facility];  Service: Cardiopulmonary;;   BRONCHIAL NEEDLE ASPIRATION BIOPSY  10/27/2022   Procedure: BRONCHIAL NEEDLE ASPIRATION BIOPSIES;  Surgeon: Lupita Leash, MD;  Location: [facility] ENDOSCOPY;  Service: Cardiopulmonary;;   BRONCHIAL WASHINGS  10/27/2022   Procedure: BRONCHIAL WASHINGS;  Surgeon: Lupita Leash, MD;  Location: [facility] ENDOS[facility];  Service: Cardiopulmonary;;   CESAREAN SECTION     CHOLECYSTECTOMY     EYE SURGERY     Bilateral Cataracts   HEMOSTASIS CONTROL  10/27/2022   Procedure: HEMOSTASIS CONTROL;  Surgeon: Lupita Leash, MD;  Location: [facility]OS[facility];  Service: Cardiopulmonary;;   KNEE SURGERY Left    NEUROPLASTY / TRANSPOSITION MEDIAN NERVE AT CARPAL TUNNEL  05/2021   Back   SKIN BIOPSY  10/05/2022   left upper arm   SKIN CANCER EXCISION     TONSILLECTOMY     VIDEO BRONCHOSCOPY WITH ENDOBRONCHIAL ULTRASOUND N/A 10/27/2022   Procedure: VIDEO BRONCHOSCOPY WITH ENDOBRONCHIAL ULTRASOUND;  Surgeon: Lupita Leash, MD;  Location: [facility];  Service: Cardiopulmonary;  Laterality: N/A;   Family History  Problem Relation Age of Onset   Arthritis Mother    Arthritis Father    Leukemia Father    Liver disease Brother    Diabetes Brother    Breast cancer Paternal Aunt    Breast cancer  Daughter    Social History   Socioeconomic History   Marital status: Widowed    Spouse name: Not on file   Number of children: Not on file   Years of education: Not on file   Highest education level: Master's degree (e.g., MA, MS, MEng, MEd, MSW, MBA)  Occupational History   Not on file  Tobacco Use   Smoking status: Never    Passive exposure: Past   Smokeless tobacco: Never  Vaping Use   Vaping status: Never Used  Substance and Sexual Activity   Alcohol use: Not Currently   Drug use: Never   Sexual activity: Yes  Other Topics Concern   Not on file  Social History Narrative   Not on file   Social Drivers of Health   Financial Resource Strain: Low Risk  (02/11/2024)   Overall Financial Resource Strain (CARDIA)    Difficulty of Paying Living Expenses: Not hard at all  Food Insecurity: No Food  Insecurity (02/11/2024)   Hunger Vital Sign    Worried About Running Out of Food in the Last Year: Never true    Ran Out of Food in the Last Year: Never true  Transportation Needs: No Transportation Needs (02/11/2024)   PRAPARE - Administrator, Civil Service (Medical): No    Lack of Transportation (Non-Medical): No  Physical Activity: Sufficiently Active (02/11/2024)   Exercise Vital Sign    Days of Exercise per Week: 7 days    Minutes of Exercise per Session: 30 min  Stress: No Stress Concern Present (02/11/2024)   [employer] of Occupational Health - Occupational Stress Questionnaire    Feeling of Stress : Not at all  Social Connections: Moderately Isolated (02/11/2024)   Social Connection and Isolation Panel [NHANES]    Frequency of Communication with Friends and Family: More than three times a week    Frequency of Social Gatherings with Friends and Family: Twice a week    Attends Religious Services: More than 4 times per year    Active Member of [employer] or Organizations: No    Attends Banker Meetings: Never    Marital Status: Widowed    Tobacco  Counseling Counseling given: Not Answered    Clinical Intake:  Pre-visit preparation completed: Yes  Pain : 0-10 Pain Score: 4  Pain Type: Chronic pain Pain Location: Back Pain Orientation: Lower, Left Pain Onset: More than a month ago Pain Frequency: Constant     Nutritional Status: BMI 25 -29 Overweight Nutritional Risks: None Diabetes: No  Lab Results  Component Value Date   HGBA1C 6.1 12/02/2021     How often do you need to have someone help you when you read instructions, pamphlets, or other written materials from your doctor or pharmacy?: 1 - Never  Interpreter Needed?: No  Information entered by :: NAllen LPN   Activities of Daily Living     02/11/2024   11:03 AM  In your present state of health, do you have any difficulty performing the following activities:  Hearing? 0  Vision? 0  Difficulty concentrating or making decisions? 0  Walking or climbing stairs? 0  Dressing or bathing? 0  Doing errands, shopping? 0  Preparing Food and eating ? N  Using the Toilet? N  In the past six months, have you accidently leaked urine? Y  Do you have problems with loss of bowel control? N  Managing your Medications? N  Managing your Finances? N  Housekeeping or managing your Housekeeping? N    Patient Care Team: Graig Lawyer, MD as PCP - General (Family Medicine) Alejandra Amos, MD (Rehabilitation) Flavio Huguenin, MD as Referring Physician (Orthopedic Surgery) Shamleffer, Julian Obey, MD as Attending Physician (Endocrinology) Hassan Links, MD as Consulting Physician (Cardiology) Matt Song, MD as Consulting Physician (Rheumatology)  Indicate any recent Medical Services you may have received from other than [facility] providers in the past year (date may be approximate).     Assessment:   This is a routine wellness examination for [place].  Hearing/Vision screen Hearing Screening - Comments:: Denies hearing issues Vision Screening -  Comments:: Regular eye exams,  [facility]   Goals Addressed             This Visit's Progress    Patient Stated       02/11/2024, wants to get rid of belly       Depression Screen     02/11/2024   11:13 AM  10/02/2023   10:02 AM 07/11/2023    8:02 AM 03/20/2023    1:56 PM 02/05/2023   10:53 AM 02/01/2022   10:36 AM 02/01/2022   10:33 AM  PHQ 2/9 Scores  PHQ - 2 Score 0 0 0 0 0 0 0  PHQ- 9 Score 0          Fall Risk     02/11/2024   11:11 AM 12/12/2023    1:27 PM 10/02/2023   10:01 AM 07/11/2023    8:01 AM 03/20/2023    1:49 PM  Fall Risk   Falls in the past year? 1 1 0 0 0  Comment fell trying to sit down      Number falls in past yr: 0 0 0 0   Injury with Fall? 0 0 0 0   Risk for fall due to : Medication side effect  No Fall Risks No Fall Risks   Follow up Falls prevention discussed;Falls evaluation completed  Falls prevention discussed Falls evaluation completed     MEDICARE RISK AT HOME:  Medicare Risk at Home Any stairs in or around the home?: Yes If so, are there any without handrails?: No Home free of loose throw rugs in walkways, pet beds, electrical cords, etc?: Yes Adequate lighting in your home to reduce risk of falls?: Yes Life alert?: No Use of a cane, walker or w/c?: No Grab bars in the bathroom?: Yes Shower chair or bench in shower?: Yes Elevated toilet seat or a handicapped toilet?: Yes  TIMED UP AND GO:  Was the test performed?  Yes  Length of time to ambulate 10 feet: 7 sec Gait slow and steady without use of assistive device  Cognitive Function: 6CIT completed        02/11/2024   11:17 AM 02/05/2023   10:56 AM  6CIT Screen  What Year? 0 points 0 points  What month? 0 points 0 points  What time? 0 points 0 points  Count back from 20 2 points 0 points  Months in reverse 0 points 0 points  Repeat phrase 4 points 2 points  Total Score 6 points 2 points    Immunizations Immunization History  Administered Date(s) Administered    Fluad Quad(high Dose 65+) 09/13/2021   Fluad Trivalent(High Dose 65+) 07/11/2023   Influenza, High Dose Seasonal PF 09/05/2013, 08/17/2016, 08/13/2017   PFIZER(Purple Top)SARS-COV-2 Vaccination 11/15/2019, 12/06/2019   Pneumococcal Conjugate-13 05/05/2015   Pneumococcal Polysaccharide-23 03/05/2007   Td 01/13/2022   Td,absorbed, Preservative Free, Adult Use, Lf Unspecified 10/30/1996   Tdap 03/05/2007   Zoster Recombinant(Shingrix) 12/07/2021, 04/12/2022   Zoster, Live 10/20/2009    Screening Tests Health Maintenance  Topic Date Due   COVID-19 Vaccine (3 - 2024-25 season) 07/01/2023   INFLUENZA VACCINE  05/30/2024   Medicare Annual Wellness (AWV)  02/10/2025   DTaP/Tdap/Td (3 - Td or Tdap) 01/14/2032   Pneumonia Vaccine 34+ Years old  Completed   DEXA SCAN  Completed   Zoster Vaccines- Shingrix  Completed   HPV VACCINES  Aged Out   Meningococcal B Vaccine  Aged Out    Health Maintenance  Health Maintenance Due  Topic Date Due   COVID-19 Vaccine (3 - 2024-25 season) 07/01/2023   Health Maintenance Items Addressed: Declines covid vaccine.  Additional Screening:  Vision Screening: Recommended annual ophthalmology exams for early detection of glaucoma and other disorders of the eye.  Dental Screening: Recommended annual dental exams for proper oral hygiene  Community Resource Referral / Chronic Care  Management: CRR required this visit?  No   CCM required this visit?  No     Plan:     I have personally reviewed and noted the following in the patient's chart:   Medical and social history Use of alcohol, tobacco or illicit drugs  Current medications and supplements including opioid prescriptions. Patient is not currently taking opioid prescriptions. Functional ability and status Nutritional status Physical activity Advanced directives List of other physicians Hospitalizations, surgeries, and ER visits in previous 12 months Vitals Screenings to include  cognitive, depression, and falls Referrals and appointments  In addition, I have reviewed and discussed with patient certain preventive protocols, quality metrics, and best practice recommendations. A written personalized care plan for preventive services as well as general preventive health recommendations were provided to patient.     Areatha Beecham, LPN   4/74/2595   After Visit Summary: (In Person-Printed) AVS printed and given to the patient  Notes: Nothing significant to report at this time.

## 2024-02-27 ENCOUNTER — Ambulatory Visit
Admission: RE | Admit: 2024-02-27 | Discharge: 2024-02-27 | Disposition: A | Discharge status: Home / Self Care | Payer: Medicare PPO | Source: Ambulatory Visit | Attending: Family Medicine | Admitting: Family Medicine

## 2024-02-27 DIAGNOSIS — M8588 Other specified disorders of bone density and structure, other site: Secondary | ICD-10-CM | POA: Diagnosis not present

## 2024-02-27 DIAGNOSIS — N958 Other specified menopausal and perimenopausal disorders: Secondary | ICD-10-CM | POA: Diagnosis not present

## 2024-02-27 DIAGNOSIS — Z13820 Encounter for screening for osteoporosis: Secondary | ICD-10-CM

## 2024-02-27 DIAGNOSIS — Z780 Asymptomatic menopausal state: Secondary | ICD-10-CM

## 2024-03-10 ENCOUNTER — Encounter: Payer: Self-pay | Admitting: Family Medicine

## 2024-05-15 ENCOUNTER — Other Ambulatory Visit: Payer: Self-pay | Admitting: *Deleted

## 2024-05-15 DIAGNOSIS — L820 Inflamed seborrheic keratosis: Secondary | ICD-10-CM | POA: Diagnosis not present

## 2024-05-15 DIAGNOSIS — L570 Actinic keratosis: Secondary | ICD-10-CM | POA: Diagnosis not present

## 2024-05-15 DIAGNOSIS — D692 Other nonthrombocytopenic purpura: Secondary | ICD-10-CM | POA: Diagnosis not present

## 2024-05-15 DIAGNOSIS — Z85828 Personal history of other malignant neoplasm of skin: Secondary | ICD-10-CM | POA: Diagnosis not present

## 2024-05-15 DIAGNOSIS — I8391 Asymptomatic varicose veins of right lower extremity: Secondary | ICD-10-CM | POA: Diagnosis not present

## 2024-05-15 DIAGNOSIS — L814 Other melanin hyperpigmentation: Secondary | ICD-10-CM | POA: Diagnosis not present

## 2024-05-15 DIAGNOSIS — L821 Other seborrheic keratosis: Secondary | ICD-10-CM | POA: Diagnosis not present

## 2024-05-19 ENCOUNTER — Other Ambulatory Visit: Payer: Self-pay | Admitting: *Deleted

## 2024-05-19 DIAGNOSIS — J849 Interstitial pulmonary disease, unspecified: Secondary | ICD-10-CM

## 2024-05-20 NOTE — Progress Notes (Unsigned)
 06/06/2023 Follow up: ILD   83 year old female never smoker seen for pulmonary consult November 2023 for Pneumonia found to have ILD changes on CT chest  Covid Pneumonia 07/2022 patient        Patient returns for a 83-month follow-up.  Patient has a history of recurrent pneumonia.  Was seen in November 2023 for pulmonary consult after pneumonia and COVID-19 that developed in September 2023.  Patient was initially seen with significant shortness of breath, decreased activity tolerance and stamina.  During her workup autoimmune/CTD serology negative except for a positive ANA along with joint pains.  She was seen by rheumatology October 2023 with Dr. Jeannetta.  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 Underwent Bronchoscopy that was unrevealing.  Since last visit patient says she is actually doing much better.  She is starting to walk on a consistent basis.  She has been able to build her stamina back up she is walking every day for 30 minutes.  She typically stops 1 time to rest and is able to complete her walk each day.  She says this is a big improvement since her last visit.  Her energy level has picked back up as well.  Her shortness of breath is also decreased.  She has been under quite a bit of stress.  Her husband passed away last week.  Patient denies any increased cough.  She does have an intermittent cough with clear mucus. Recent CT chest in June showed chronic interstitial changes without progression.  PFTs in July showed a slight drop in lung capacity with FVC going from 91% to 75% and diffusing capacity going from 77% to 75%. Patient is a never smoker.  Has no previous diagnosis of COPD or asthma.  Is not on any inhalers.  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 (Historical dx of discoid lupus) Grew up with wood  stove, worked on tobacco farm.   TEST/EVENTS : PFT November 02, 2022 showed FEV1 at 106%, ratio 86, FVC 91%, no significant bronchodilator response, DLCO at 77%. PFT May 23, 2023 showed FEV1 at 86%, ratio 85, FVC 75%, no significant bronchodilator response, DLCO 75%.  Serology-CTD/autoimmune labs neg except for ANA + Bronch 10/27/22-Path neg. Cytology neg for malignant cells. 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  OV 12/12/2023  Subjective:  Patient ID: Shelby Cardenas, female , DOB: August 18, 1941 , age 83 y.o. , MRN: [id number] , ADDRESS: [address] [place] [address] PCP Thedora Garnette HERO, MD Patient Care Team: Thedora Garnette HERO, MD as PCP - General (Family Medicine) Darlean Ned, MD (Rehabilitation) Evern Donnajean Kayser, MD as Referring Physician (Orthopedic Surgery) Shamleffer, Donell Cardinal, MD as Attending Physician (Endocrinology) Monetta Redell PARAS, MD as Consulting Physician (Cardiology) Jeannetta Lonni ORN, MD as Consulting Physician (Rheumatology)  This Provider for this visit: Treatment Team:  Attending Provider: Geronimo Amel, MD    12/12/2023 -   Chief Complaint  Patient presents with  Follow-up    PFT done today. She is coughing less and has occ clear, thick sputum.      HPI Shelby Cardenas 83 y.o. -presents for a new visit.  Transfer of care from Dr. Alaine to Dr. Geronimo.  She has ILD.  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  Her daughter who is Ritta mother is  also married to REESE who is the brother for Lyle and sister-in-law to my late patient Wanda Pizza who died of NSIP and pulmonary fibrosis right at lung transplant at [facility].  She is here because she understands she is scar tissue.  In 2023 she did have bronchoscopy that was nondiagnostic by Dr. Alaine.  She is on supportive care and expectant approach she has some baseline mild shortness of breath with exertion and also chronic cough.  But otherwise stable.  She did have November 2024 CT angiogram chest for pulmonary embolism and this ILD looks stable and very similar to November 2023 that I personally visualized and showed her.  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  She had pulmonary function test for this visit that I reviewed and it shows 4-year stability   CT angiogram November 2024. IMPRESSION: 1.  No evidence of pulmonary embolism. 2. Interstitial lung disease is relatively similar to 04/04/2023 CT. Favor nonspecific interstitial pneumonitis. 3. Incidental findings, including: Coronary artery atherosclerosis. Aortic Atherosclerosis (ICD10-I70.0). Hepatic steatosis and probable hepatomegaly.     Electronically Signed   By: Rockey Kilts M.D.   On: 09/20/2023 14:55    Latest Reference Range & Units 08/09/22 08:58 10/04/22 13:57 10/04/22 13:59  Anti Nuclear Antibody (ANA) NEGATIVE   POSITIVE !   ANA Pattern 1   Nuclear, Speckled !   ANA Titer 1 titer  1:80 (H)   ANCA SCREEN Negative   Negative   Atypical pANCA Neg:<1:20 titer  <1:20   Cyclic Citrullin Peptide Ab UNITS  <16   ds DNA Ab IU/mL <1 <1   Cytoplasmic (C-ANCA) Neg:<1:20 titer  <1:20   P-ANCA Neg:<1:20 titer  <1:20   ENA SM Ab Ser-aCnc <1.0 NEG AI <1.0 NEG <1.0 NEG   Anti-Jo-1 Ab (RDL) <20 Units   <20  Anti-PL-7 Ab (RDL) Negative    Negative  Anti-PL-12 Ab (RDL) Negative    Negative  Anti-EJ Ab (RDL) Negative    Negative  Anti-OJ Ab (RDL) Negative    Negative  Anti-SRP Ab (RDL) Negative    Negative  Anti-Mi-2  Ab (RDL) Negative    Negative  Anti-TIF-1gamma Ab (RDL) <20 Units   <20  Anti-MDA-5 Ab (CADM-140)(RDL) <20 Units   <20  Anti-NXP-2 (P140) Ab (RDL) <20 Units   <20  Anti-SAE1 Ab, IgG (RDL) <20 Units   <20  Anti-PM/Scl-100 Ab (RDL) <20 Units   <20  Anti-Ku Ab (RDL) Negative    Negative  Anti-SS-A 52kD Ab, IgG (RDL) <20 Units   66 (H)  Anti-U1 RNP Ab (RDL) <20 Units   <20  Anti-U2 RNP Ab (RDL) Negative    Negative  Anti-U3 RNP (Fibrillarin)(RDL) Negative    Negative  Anti-MPO Antibodies 0.0 - 0.9 units  <0.2   Anti-PR3 Antibodies 0.0 - 0.9 units  <0.2   C3 Complement mg/dL 787    C4 Complement mg/dL 26    Ribonucleic Protein(ENA) Antibody, IgG <1.0 NEG AI <1.0 NEG <1.0 NEG   SSA (Ro) (ENA) Antibody, IgG <1.0 NEG AI  <1.0 NEG   SSB (La) (ENA) Antibody, IgG <1.0 NEG AI  <  1.0 NEG   Scleroderma (Scl-70) (ENA) Antibody, IgG <1.0 NEG AI  <1.0 NEG   !: Data is abnormal (H): Data is abnormally high       OV 05/21/2024  Subjective:  Patient ID: Shelby Cardenas, female , DOB: [birth year] , age 4 y.o. , MRN: [id number] , ADDRESS: [address] [place] [address] PCP Thedora Garnette HERO, MD Patient Care Team: Thedora Garnette HERO, MD as PCP - General (Family Medicine) Darlean Ned, MD (Rehabilitation) Evern Donnajean Kayser, MD as Referring Physician (Orthopedic Surgery) Shamleffer, Donell Cardinal, MD as Attending Physician (Endocrinology) Monetta Redell PARAS, MD as Consulting Physician (Cardiology) Jeannetta Lonni ORN, MD as Consulting Physician (Rheumatology)  This Provider for this visit: Treatment Team:  Attending Provider: Geronimo Amel, MD    05/21/2024 -   Chief Complaint  Patient presents with   Medical Management of Chronic Issues   Interstitial Lung Disease    Breathing has been improving. She has occ cough-prod with clear sputum.     Interstitial lung disease with positive ANA trace and also positive SSA myositis antibody on observation therapy -clinically behaving like  fibrotic NSIP  HPI Shelby Cardenas 83 y.o. -returns for follow-up.  Presents with daughter. Interim Health status: No new complaints No new medical problems. No new surgeries. No ER visits. No Urgent care visits. No changes to medications.  She did do the interstitial lung disease questionnaire and is as below    Manufacturing engineer Comprehensive ILD Questionnaire  Symptoms:  -She has had symptoms for a year or 2.  Including cough and shortness of breath.  These are stable.  Details are below. SYMPTOM SCALE - ILD 05/21/2024  Current weight   O2 use ra  Shortness of Breath 0 -> 5 scale with 5 being worst (score 6 If unable to do)  At rest 0  Simple tasks - showers, clothes change, eating, shaving 2  Household (dishes, doing bed, laundry) 2  Shopping 1  Walking level at own pace 2  Walking up Stairs 3  Total (30-36) Dyspnea Score 10      Non-dyspnea symptoms (0-> 5 scale) 05/21/2024  How bad is your cough? 2  How bad is your fatigue 3  How bad is nausea 1  How bad is vomiting?  0  How bad is diarrhea? 0  How bad is anxiety? 00  How bad is depression 00  Any chronic pain - if so where and how bad 0    She did have COVID pneumonia in 2023 FAMILY HISTORY of LUNG DISEASE:  Denies  PERSONAL EXPOSURE HISTORY:  Never smoked cigarettes no marijuana no cocaine no intravenous drug use  HOME  EXPOSURE and HOBBY DETAILS :  Single-family home [place].  Home is [address] years old.  Detail antigen exposure history in the house is negative  OCCUPATIONAL HISTORY (122 questions) : She is a retired Programmer, systems.  She has worked in [employer].  As a young child she did help and her family former tobacco.  She is also done some cotton production work.  PULMONARY TOXICITY HISTORY (27 items):  Detailed therapeutic drug exposure history is negative  INVESTIGATIONS: Pulmonary function test today is stable for many years.      PFT     Latest Ref Rng & Units  05/21/2024    9:34 AM 12/12/2023   11:13 AM 05/23/2023    4:50 PM 11/02/2022    3:39 PM 05/23/2019    1:32 PM  PFT Results  FVC-Pre L 2.14  P 1.94   2.22    FVC-Predicted Pre % 93  P 85  75  90  75   FVC-Post L   1.87  2.24  1.87   FVC-Predicted Post %   76  91  76   Pre FEV1/FVC % % 81  P 86  85  84  85   Post FEV1/FCV % %   69  86  69   FEV1-Pre L 1.73  P 1.66  1.57  1.85  1.57   FEV1-Predicted Pre % 102  P 98  86  102  86   FEV1-Post L   1.30  1.93  1.30   DLCO uncorrected ml/min/mmHg 13.33  P 14.29  13.74  13.98  13.74   DLCO UNC% % 76  P 81  75  77  75   DLCO corrected ml/min/mmHg 13.33  P 14.29  13.74  13.98  13.74   DLCO COR %Predicted % 76  P 81  75  77  75   DLVA Predicted % 86  P 100  90  98  90   TLC L   5.24  4.35  5.24   TLC % Predicted %   106  88  106   RV % Predicted %   130  88  130     P Preliminary result        SIT STAND TEST - goal 15 times   05/21/2024    O2 used ra   PRobe - finter or forehead fubger   Number sit and stand completed - goal 15 15   Time taken to complete 50 sec   Resting Pulse Ox/HR/Dyspnea  96% and 84/min and dyspnea of 0/10    Peak measures 97 % and 108/min and dyspnea of 0/10   Final Pulse Ox/HR 96% and 82/min and dyspnea of 0/10   Desaturated </= 88% no   Desaturated <= 3% points no   Got Tachycardic >/= 90/min yes   Miscellaneous comments x      LAB RESULTS last 96 hours No results found.       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   reports that she has never smoked. She has been exposed to tobacco smoke. She has never used smokeless tobacco.  Past Surgical History:  Procedure Laterality Date   BRONCHIAL BIOPSY  10/27/2022   Procedure: BRONCHIAL BIOPSIES;  Surgeon: Alaine Vicenta NOVAK, MD;  Location: [facility] ENDOSCOPY;  Service: Cardiopulmonary;;   BRONCHIAL NEEDLE ASPIRATION BIOPSY  10/27/2022   Procedure: BRONCHIAL NEEDLE ASPIRATION BIOPSIES;  Surgeon: Alaine Vicenta NOVAK, MD;  Location: [facility] ENDOSCOPY;  Service: Cardiopulmonary;;   BRONCHIAL WASHINGS  10/27/2022   Procedure: BRONCHIAL WASHINGS;  Surgeon: Alaine Vicenta NOVAK, MD;  Location: [facility] ENDOSCOPY;  Service: Cardiopulmonary;;   CESAREAN SECTION     CHOLECYSTECTOMY     EYE SURGERY     Bilateral Cataracts   HEMOSTASIS CONTROL  10/27/2022   Procedure: HEMOSTASIS CONTROL;  Surgeon: Alaine Vicenta NOVAK, MD;  Location: [facility] ENDOSCOPY;  Service: Cardiopulmonary;;   KNEE SURGERY Left    NEUROPLASTY / TRANSPOSITION MEDIAN NERVE AT CARPAL TUNNEL  05/2021   Back   SKIN BIOPSY  10/05/2022   left upper arm   SKIN CANCER EXCISION     TONSILLECTOMY     VIDEO BRONCHOSCOPY WITH ENDOBRONCHIAL ULTRASOUND N/A 10/27/2022   Procedure: VIDEO BRONCHOSCOPY WITH ENDOBRONCHIAL ULTRASOUND;  Surgeon: Alaine Vicenta NOVAK, MD;  Location: [facility] ENDOSCOPY;  Service: Cardiopulmonary;  Laterality: N/A;    Allergies  Allergen Reactions   Macrobid [Nitrofurantoin] Rash   Boniva  [Ibandronate ] Other (See Comments)    Muscle aches /headache   Gabapentin     Dizzy and fell when taking     Prednisone     Makes me feel weird    Tizanidine      Low bp   Tramadol  Itching   Ciprofloxacin     weird feeling    Codeine Itching and Rash   Plaquenil [Hydroxychloroquine] Rash   Sulfa  Antibiotics Itching and Rash    Immunization History  Administered Date(s) Administered   Fluad Quad(high Dose 65+) 09/13/2021   Fluad Trivalent(High Dose 65+) 07/11/2023   Influenza, High Dose Seasonal PF 09/05/2013, 08/17/2016, 08/13/2017   PFIZER(Purple Top)SARS-COV-2 Vaccination 11/15/2019, 12/06/2019   Pneumococcal Conjugate-13 05/05/2015   Pneumococcal Polysaccharide-23 03/05/2007   Td 01/13/2022   Td,absorbed, Preservative Free, Adult Use, Lf Unspecified 10/30/1996   Tdap 03/05/2007   Zoster Recombinant(Shingrix) 12/07/2021, 04/12/2022   Zoster, Live 10/20/2009    Family History  Problem Relation Age of Onset   Arthritis Mother    Arthritis Father    Leukemia Father    Liver disease Brother    Diabetes Brother    Breast cancer Paternal Aunt    Breast cancer Daughter      Current Outpatient Medications:    Alpha Lipoic Acid 200 MG CAPS, Take 200 mg by mouth daily., Disp: , Rfl:    amoxicillin -clavulanate (AUGMENTIN ) 500-125 MG tablet, Take 1 tablet by mouth in the morning and at bedtime. (Patient not taking: Reported on 02/11/2024), Disp: 20 tablet, Rfl: 0   Ascorbic Acid (VITAMIN C PO), Take 1 tablet by mouth daily., Disp: , Rfl:    b complex vitamins capsule, Take 1 capsule by mouth daily., Disp: , Rfl:    Calcium-Phosphorus-Vitamin D (CITRACAL +D3 PO), Take 1 capsule by mouth daily., Disp: , Rfl:    COLLAGEN PO, Take 1 Scoop by mouth daily. Coffee, Disp: , Rfl:    diclofenac Sodium (VOLTAREN) 1 % GEL, Apply 2 g topically at bedtime. Knee pain, Disp: , Rfl:    Glycerin-Hypromellose-PEG 400 (DRY EYE RELIEF DROPS OP), Place 1 drop into both eyes 2 (two) times daily., Disp: , Rfl:    ibuprofen  (ADVIL ) 600 MG tablet, Take 1 tablet (600 mg total) by mouth every 8 (eight) hours as needed., Disp: 30 tablet, Rfl: 2   latanoprost (XALATAN) 0.005 % ophthalmic solution, Place 1 drop into both eyes at bedtime., Disp: , Rfl:    lidocaine  (LIDODERM ) 5 %, Place 1 patch onto the skin  daily. Remove & Discard patch within 12 hours or as directed by MD, Disp: 14 patch, Rfl: 0   methocarbamol  (ROBAXIN ) 500 MG tablet, Take 1 tablet (500 mg total) by mouth every 8 (eight) hours as needed for muscle spasms. (Patient not taking: Reported on 02/11/2024), Disp: 30 tablet, Rfl: 0   Multiple Vitamin (MULTIVITAMIN ADULT PO), Take 1 tablet by mouth daily. Balance of nature, Disp: , Rfl:  Probiotic Product (PROBIOTIC DAILY PO), Take 1 tablet by mouth daily., Disp: , Rfl:    traMADol  (ULTRAM ) 50 MG tablet, Take 1 tablet (50 mg total) by mouth every 6 (six) hours as needed. (Patient not taking: Reported on 02/11/2024), Disp: 15 tablet, Rfl: 0   Turmeric Curcumin 500 MG CAPS, Take 500 mg by mouth daily., Disp: , Rfl:       Objective:   Vitals:   05/21/24 1047  BP: (!) 144/68  Pulse: 84  SpO2: 96%  Weight: 162 lb (73.5 kg)  Height: 5' 2 (1.575 m)    Estimated body mass index is 29.63 kg/m as calculated from the following:   Height as of this encounter: 5' 2 (1.575 m).   Weight as of this encounter: 162 lb (73.5 kg).  @WEIGHTCHANGE @  [employer]   05/21/24 1047  Weight: 162 lb (73.5 kg)     Physical Exam   General: No distress. Looks well O2 at rest: no Cane present: non Sitting in wheel chair: no Frail: nono Obese: no Neuro: Alert and Oriented x 3. GCS 15. Speech normal Psych: Pleasant Resp:  Barrel Chest - no.  Wheeze - no, Crackles - mild crackles base, No overt respiratory distress CVS: Normal heart sounds. Murmurs - no Ext: Stigmata of Connective Tissue Disease - no HEENT: Normal upper airway. PEERL +. No post nasal drip        Assessment/PLAN     Assessment & Plan ILD (interstitial lung disease) (HCC)  Positive ANA (antinuclear antibody)  NSIP (nonspecific interstitial pneumonia) (HCC)    Patient Instructions     ICD-10-CM   1. ILD (interstitial lung disease) (HCC)  J84.9     2. 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  Some lung nodule in Nov 2024 - radiology said does not need additional followup   Plan   - - Do spirometry and DLCO in 6 months - -Always be up-to-date with respiratory vaccines, avoid exposures to sick people and smoke and pollution.  Follow-up - 15-minute visit in 4 months after spirometry and DLCO.     FOLLOWUP    Return in about 6 months (around 11/21/2024) for 15 min visit, after Spiro and DLCO, with Dr Geronimo, with [facility]    SIGNATURE    Dr. Dorethia Geronimo, M.D., F.C.C.P,  Pulmonary and Critical Care Medicine Staff Physician, [facility] Director - Interstitial Lung Disease  Program  Pulmonary Fibrosis [facility] at [facility] [place], [place], [address]  Pager: [phone], If no answer or between  15:00h - 7:00h: call [phone] Telephone: [phone]  6:29 PM 05/21/2024

## 2024-05-21 ENCOUNTER — Ambulatory Visit: Admitting: Internal Medicine

## 2024-05-21 ENCOUNTER — Encounter: Payer: Self-pay | Admitting: Internal Medicine

## 2024-05-21 VITALS — BP 144/68 | HR 84 | Ht 62.0 in | Wt 162.0 lb

## 2024-05-21 DIAGNOSIS — J849 Interstitial pulmonary disease, unspecified: Secondary | ICD-10-CM

## 2024-05-21 DIAGNOSIS — J8489 Other specified interstitial pulmonary diseases: Secondary | ICD-10-CM | POA: Diagnosis not present

## 2024-05-21 DIAGNOSIS — R768 Other specified abnormal immunological findings in serum: Secondary | ICD-10-CM | POA: Diagnosis not present

## 2024-05-21 LAB — PULMONARY FUNCTION TEST
DL/VA % pred: 86 %
DL/VA: 3.58 ml/min/mmHg/L
DLCO cor % pred: 76 %
DLCO cor: 13.33 ml/min/mmHg
DLCO unc % pred: 76 %
DLCO unc: 13.33 ml/min/mmHg
FEF 25-75 Pre: 1.95 L/s
FEF2575-%Pred-Pre: 163 %
FEV1-%Pred-Pre: 102 %
FEV1-Pre: 1.73 L
FEV1FVC-%Pred-Pre: 110 %
FEV6-%Pred-Pre: 99 %
FEV6-Pre: 2.14 L
FEV6FVC-%Pred-Pre: 106 %
FVC-%Pred-Pre: 93 %
FVC-Pre: 2.14 L
Pre FEV1/FVC ratio: 81 %
Pre FEV6/FVC Ratio: 100 %

## 2024-05-21 NOTE — Patient Instructions (Signed)
 Spirometry/DLCO performed today.

## 2024-05-21 NOTE — Progress Notes (Signed)
 Spirometry/DLCO performed today.

## 2024-05-21 NOTE — Patient Instructions (Addendum)
 ICD-10-CM   1. ILD (interstitial lung disease) (HCC)  J84.9     2. 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  Some lung nodule in Nov 2024 - radiology said does not need additional followup   Plan   - - Do spirometry and DLCO in 6 months - -Always be up-to-date with respiratory vaccines, avoid exposures to sick people and smoke and pollution.  Follow-up - 15-minute visit in 4 months after spirometry and DLCO.

## 2024-05-26 DIAGNOSIS — H0100A Unspecified blepharitis right eye, upper and lower eyelids: Secondary | ICD-10-CM | POA: Diagnosis not present

## 2024-05-26 DIAGNOSIS — H401132 Primary open-angle glaucoma, bilateral, moderate stage: Secondary | ICD-10-CM | POA: Diagnosis not present

## 2024-05-26 DIAGNOSIS — H18523 Epithelial (juvenile) corneal dystrophy, bilateral: Secondary | ICD-10-CM | POA: Diagnosis not present

## 2024-05-26 DIAGNOSIS — H04123 Dry eye syndrome of bilateral lacrimal glands: Secondary | ICD-10-CM | POA: Diagnosis not present

## 2024-05-26 DIAGNOSIS — H0100B Unspecified blepharitis left eye, upper and lower eyelids: Secondary | ICD-10-CM | POA: Diagnosis not present

## 2024-06-27 ENCOUNTER — Telehealth: Payer: Self-pay

## 2024-06-27 NOTE — Telephone Encounter (Signed)
 Copied from CRM [id number]. Topic: Clinical - Request for Lab/Test Order >> Jun 27, 2024 12:27 PM Laymon HERO wrote: Reason for CRM: Patient wanting to know if she needs to get lab work done before her follow up appointment on 9/24, please reach out

## 2024-06-27 NOTE — Telephone Encounter (Signed)
 Patient notified VIA phone that we will do blood work at her appt. Dm/cma

## 2024-07-23 ENCOUNTER — Ambulatory Visit: Payer: Self-pay | Admitting: Family Medicine

## 2024-07-23 ENCOUNTER — Ambulatory Visit: Admitting: Family Medicine

## 2024-07-23 VITALS — BP 124/76 | HR 68 | Temp 97.1°F | Ht 62.0 in | Wt 165.8 lb

## 2024-07-23 DIAGNOSIS — E782 Mixed hyperlipidemia: Secondary | ICD-10-CM | POA: Diagnosis not present

## 2024-07-23 DIAGNOSIS — J849 Interstitial pulmonary disease, unspecified: Secondary | ICD-10-CM

## 2024-07-23 DIAGNOSIS — Z23 Encounter for immunization: Secondary | ICD-10-CM

## 2024-07-23 DIAGNOSIS — M170 Bilateral primary osteoarthritis of knee: Secondary | ICD-10-CM

## 2024-07-23 LAB — LIPID PANEL
Cholesterol: 231 mg/dL — ABNORMAL HIGH (ref 0–200)
HDL: 55 mg/dL (ref >39.00)
LDL Cholesterol: 126 mg/dL — ABNORMAL HIGH (ref 0–99)
NonHDL: 175.93
Total CHOL/HDL Ratio: 4
Triglycerides: 252 mg/dL — ABNORMAL HIGH (ref 0.0–149.0)
VLDL: 50.4 mg/dL — ABNORMAL HIGH (ref 0.0–40.0)

## 2024-07-23 NOTE — Assessment & Plan Note (Signed)
 I am concerned about the regular use of ibuprofen , esp. the risk for kidney and GI complications. I asked Ms. Dipierro to try and avoid use of this. I recommend she follow up with her orthopedist to consider a gel injection of her knees to see if this will provide better relief of pain for a time.

## 2024-07-23 NOTE — Assessment & Plan Note (Signed)
 Stable. She will continue to follow with pulmonology to monitor the status of her ILD. Recommend she get her influenza vaccine today and plan to see her pharmacist about a COVID booster.

## 2024-07-23 NOTE — Progress Notes (Signed)
 [facility] [address] [place] [address] Dept: [phone] Dept Fax: [phone]  Chronic Care Office Visit  Subjective:    Patient ID: Shelby Cardenas, female    DOB: 1941/01/28, 83 y.o..   MRN: [id number]  Chief Complaint  Patient presents with   Follow-up    6 month f/u.  C/o having bilateral knee pain.  Using Voltaren & Bio-Freeze.   Flu shot today and fasting.    History of Present Illness:  Patient is in today for reassessment of chronic medical issues.  Shelby Cardenas has a history of interstitial lung disease (ILD) which initially presented as a prolonged cough with dyspnea. She is managed by pulmonology. She feels this has been stable. She currently is using a cough lozenge that does seem to help her cough mucous out of her chest. She has had a recent sore throat, but is doing salt water gargles which is also helping.   Shelby Cardenas has a history of borderline hyperlipidemia. She manages this with lifestyle approaches.   Shelby Cardenas has osteoarthritis of multiple joints. She finds her knee arthritis to be esp. bothersome. She is in a routine of taking ibuprofen  200 mg, 3 tablets at about 8:00 pm and then Tylenol  Arthritis 650 mg 2 tablets at bedtime. She Applies Voltaren gel and Biofreeze liberally. She finds she is waking at night with knee pain about 3:00 am. She has had past joint injections, but it has been quite some time.  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   Positive ANA (antinuclear antibody) 10/17/2021   Incontinence of feces 10/12/2021   Osteoarthritis of hands, bilateral 10/12/2021   Intervertebral disc disorder with radiculopathy of lumbar region 09/06/2021   Closed wedge compression fracture of first lumbar vertebra (HCC) 06/09/2021   OAB (overactive bladder) 11/12/2020   Primary open angle glaucoma of both eyes, moderate stage 12/12/2018   Mammogram abnormal 07/16/2018   Cystocele with prolapse 07/02/2018   Adenomatous colon polyp 04/11/2016   Mixed hyperlipidemia 02/06/2016   Osteopenia 02/06/2016   Diaphragmatic hernia 02/06/2016   Allergic rhinitis 02/06/2016   Past Surgical History:  Procedure Laterality Date   BRONCHIAL BIOPSY  10/27/2022   Procedure: BRONCHIAL BIOPSIES;  Surgeon: Alaine Vicenta NOVAK, MD;  Location: [facility] ENDOSCOPY;  Service: Cardiopulmonary;;   BRONCHIAL NEEDLE ASPIRATION BIOPSY  10/27/2022   Procedure: BRONCHIAL NEEDLE ASPIRATION BIOPSIES;  Surgeon: Alaine Vicenta NOVAK, MD;  Location: [facility] ENDOSCOPY;  Service: Cardiopulmonary;;   BRONCHIAL WASHINGS  10/27/2022   Procedure: BRONCHIAL WASHINGS;  Surgeon: Alaine Vicenta NOVAK, MD;  Location: [facility] ENDOSCOPY;  Service: Cardiopulmonary;;   CESAREAN SECTION     CHOLECYSTECTOMY     EYE SURGERY     Bilateral Cataracts   HEMOSTASIS CONTROL  10/27/2022   Procedure: HEMOSTASIS CONTROL;  Surgeon: Alaine Vicenta NOVAK, MD;  Location: [facility] ENDOSCOPY;  Service: Cardiopulmonary;;   KNEE SURGERY Left    NEUROPLASTY / TRANSPOSITION MEDIAN NERVE AT CARPAL TUNNEL  05/2021   Back   SKIN BIOPSY  10/05/2022   left upper  arm   SKIN CANCER EXCISION     TONSILLECTOMY     VIDEO BRONCHOSCOPY WITH ENDOBRONCHIAL ULTRASOUND N/A 10/27/2022   Procedure: VIDEO BRONCHOSCOPY WITH ENDOBRONCHIAL ULTRASOUND;  Surgeon: Alaine Vicenta NOVAK, MD;  Location: [facility] ENDOSCOPY;  Service: Cardiopulmonary;  Laterality: N/A;   Family History  Problem Relation Age of Onset    Arthritis Mother    Arthritis Father    Leukemia Father    Liver disease Brother    Diabetes Brother    Breast cancer Paternal Aunt    Breast cancer Daughter    Outpatient Medications Prior to Visit  Medication Sig Dispense Refill   Alpha Lipoic Acid 200 MG CAPS Take 200 mg by mouth daily.     Ascorbic Acid (VITAMIN C PO) Take 1 tablet by mouth daily.     b complex vitamins capsule Take 1 capsule by mouth daily.     Calcium-Phosphorus-Vitamin D (CITRACAL +D3 PO) Take 1 capsule by mouth daily.     COLLAGEN PO Take 1 Scoop by mouth daily. Coffee     diclofenac Sodium (VOLTAREN) 1 % GEL Apply 2 g topically at bedtime. Knee pain     Glycerin-Hypromellose-PEG 400 (DRY EYE RELIEF DROPS OP) Place 1 drop into both eyes 2 (two) times daily.     ibuprofen  (ADVIL ) 600 MG tablet Take 1 tablet (600 mg total) by mouth every 8 (eight) hours as needed. 30 tablet 2   latanoprost (XALATAN) 0.005 % ophthalmic solution Place 1 drop into both eyes at bedtime.     Multiple Vitamin (MULTIVITAMIN ADULT PO) Take 1 tablet by mouth daily. Balance of nature     Probiotic Product (PROBIOTIC DAILY PO) Take 1 tablet by mouth daily.     Turmeric Curcumin 500 MG CAPS Take 500 mg by mouth daily.     amoxicillin -clavulanate (AUGMENTIN ) 500-125 MG tablet Take 1 tablet by mouth in the morning and at bedtime. (Patient not taking: Reported on 02/11/2024) 20 tablet 0   lidocaine  (LIDODERM ) 5 % Place 1 patch onto the skin daily. Remove & Discard patch within 12 hours or as directed by MD 14 patch 0   methocarbamol  (ROBAXIN ) 500 MG tablet Take 1 tablet (500 mg total) by mouth every 8 (eight) hours as needed for muscle spasms. (Patient not taking: Reported on 02/11/2024) 30 tablet 0   traMADol  (ULTRAM ) 50 MG tablet Take 1 tablet (50 mg total) by mouth every 6 (six) hours as needed. (Patient not taking: Reported on 02/11/2024) 15 tablet 0   No facility-administered medications prior to visit.   Allergies  Allergen Reactions    Macrobid [Nitrofurantoin] Rash   Boniva  [Ibandronate ] Other (See Comments)    Muscle aches /headache   Gabapentin     Dizzy and fell when taking     Prednisone     Makes me feel weird    Tizanidine      Low bp   Tramadol  Itching   Ciprofloxacin     weird feeling    Codeine Itching and Rash   Plaquenil [Hydroxychloroquine] Rash   Sulfa Antibiotics Itching and Rash   Objective:   Today's Vitals   07/23/24 0816  BP: 124/76  Pulse: 68  Temp: (!) 97.1 F (36.2 C)  TempSrc: Temporal  SpO2: 99%  Weight: 165 lb 12.8 oz (75.2 kg)  Height: 5' 2 (1.575 m)   Body mass index is 30.33 kg/m.   General: Well developed, well nourished. No acute distress. Extremities: Full ROM. Some knee joint enlargement noted. No increased  warmth of the knee joints. Psych: Alert and oriented. Normal mood and affect.  There are no preventive care reminders to display for this patient.    Assessment & Plan:   Problem List Items Addressed This Visit       Respiratory   ILD (interstitial lung disease) (HCC)   Stable. She will continue to follow with pulmonology to monitor the status of her ILD. Recommend she get her influenza vaccine today and plan to see her pharmacist about a COVID booster.        Musculoskeletal and Integument   Osteoarthritis of knees, bilateral   I am concerned about the regular use of ibuprofen , esp. the risk for kidney and GI complications. I asked Ms. Behringer to try and avoid use of this. I recommend she follow up with her orthopedist to consider a gel injection of her knees to see if this will provide better relief of pain for a time.        Other   Mixed hyperlipidemia   I will reassess her lipids today.      Relevant Orders   Lipid panel   Other Visit Diagnoses       Need for immunization against influenza    -  Primary   Relevant Orders   Flu vaccine HIGH DOSE PF(Fluzone Trivalent) (Completed)       Return in about 6 months (around 01/20/2025) for  Annual preventative care.   Garnette CHRISTELLA Simpler, MD

## 2024-07-23 NOTE — Assessment & Plan Note (Signed)
 I will reassess her lipids today.

## 2024-08-07 DIAGNOSIS — N182 Chronic kidney disease, stage 2 (mild): Secondary | ICD-10-CM | POA: Diagnosis not present

## 2024-08-07 DIAGNOSIS — R2681 Unsteadiness on feet: Secondary | ICD-10-CM | POA: Diagnosis not present

## 2024-08-07 DIAGNOSIS — M1990 Unspecified osteoarthritis, unspecified site: Secondary | ICD-10-CM | POA: Diagnosis not present

## 2024-08-07 DIAGNOSIS — M545 Low back pain, unspecified: Secondary | ICD-10-CM | POA: Diagnosis not present

## 2024-08-07 DIAGNOSIS — R030 Elevated blood-pressure reading, without diagnosis of hypertension: Secondary | ICD-10-CM | POA: Diagnosis not present

## 2024-08-07 DIAGNOSIS — N959 Unspecified menopausal and perimenopausal disorder: Secondary | ICD-10-CM | POA: Diagnosis not present

## 2024-08-07 DIAGNOSIS — M519 Unspecified thoracic, thoracolumbar and lumbosacral intervertebral disc disorder: Secondary | ICD-10-CM | POA: Diagnosis not present

## 2024-08-07 DIAGNOSIS — H409 Unspecified glaucoma: Secondary | ICD-10-CM | POA: Diagnosis not present

## 2024-08-07 DIAGNOSIS — M329 Systemic lupus erythematosus, unspecified: Secondary | ICD-10-CM | POA: Diagnosis not present

## 2024-08-13 DIAGNOSIS — M1712 Unilateral primary osteoarthritis, left knee: Secondary | ICD-10-CM | POA: Diagnosis not present

## 2024-08-13 DIAGNOSIS — M170 Bilateral primary osteoarthritis of knee: Secondary | ICD-10-CM | POA: Diagnosis not present

## 2024-08-13 DIAGNOSIS — M1711 Unilateral primary osteoarthritis, right knee: Secondary | ICD-10-CM | POA: Diagnosis not present

## 2024-08-20 ENCOUNTER — Encounter: Payer: Self-pay | Admitting: Family Medicine

## 2024-09-01 ENCOUNTER — Encounter: Payer: Self-pay | Admitting: Radiology

## 2024-09-17 ENCOUNTER — Ambulatory Visit: Admitting: Nurse Practitioner

## 2024-09-17 ENCOUNTER — Encounter: Payer: Self-pay | Admitting: Nurse Practitioner

## 2024-09-17 ENCOUNTER — Ambulatory Visit: Payer: Self-pay

## 2024-09-17 ENCOUNTER — Ambulatory Visit

## 2024-09-17 VITALS — BP 122/68 | HR 59 | Temp 97.2°F | Ht 62.0 in | Wt 170.2 lb

## 2024-09-17 DIAGNOSIS — R051 Acute cough: Secondary | ICD-10-CM | POA: Diagnosis not present

## 2024-09-17 DIAGNOSIS — R0602 Shortness of breath: Secondary | ICD-10-CM | POA: Diagnosis not present

## 2024-09-17 DIAGNOSIS — H6690 Otitis media, unspecified, unspecified ear: Secondary | ICD-10-CM | POA: Diagnosis not present

## 2024-09-17 DIAGNOSIS — J849 Interstitial pulmonary disease, unspecified: Secondary | ICD-10-CM | POA: Diagnosis not present

## 2024-09-17 DIAGNOSIS — R918 Other nonspecific abnormal finding of lung field: Secondary | ICD-10-CM | POA: Diagnosis not present

## 2024-09-17 DIAGNOSIS — J949 Pleural condition, unspecified: Secondary | ICD-10-CM | POA: Diagnosis not present

## 2024-09-17 MED ORDER — AMOXICILLIN-POT CLAVULANATE 875-125 MG PO TABS
1.0000 | ORAL_TABLET | Freq: Two times a day (BID) | ORAL | 0 refills | Status: AC
Start: 2024-09-17 — End: ?

## 2024-09-17 NOTE — Telephone Encounter (Signed)
 FYI Only or Action Required?: FYI only for provider: appointment scheduled on 09/17/24.  Patient was last seen in primary care on 07/23/2024 by Thedora Garnette HERO, MD.  Called Nurse Triage reporting Cough.  Symptoms began a week ago.  Interventions attempted: Rest, hydration, or home remedies.  Symptoms are: gradually worsening.  Triage Disposition: See HCP Within 4 Hours (Or PCP Triage)  Patient/caregiver understands and will follow disposition?: Yes     Copied from CRM #[id number]. Topic: Clinical - Red Word Triage >> Sep 17, 2024  9:21 AM Robinson H wrote: [facility] that prompted transfer to Nurse Triage: Coughing up and blowing green mucus, congestion, cough, ears clogged Reason for Disposition  [1] MILD difficulty breathing (e.g., minimal/no SOB at rest, SOB with walking, pulse < 100) AND [2] still present when not coughing  Answer Assessment - Initial Assessment Questions 1. ONSET: When did the cough begin?      X 1 week  3. SPUTUM: Describe the color of your sputum (e.g., none, dry cough; clear, white, yellow, green)     Green 4. HEMOPTYSIS: Are you coughing up any blood? If Yes, ask: How much? (e.g., flecks, streaks, tablespoons, etc.)     Some in throat, primarily in nasal mucous 5. DIFFICULTY BREATHING: Are you having difficulty breathing? If Yes, ask: How bad is it? (e.g., mild, moderate, severe)      Baseline SOB, mildly worse 6. FEVER: Do you have a fever? If Yes, ask: What is your temperature, how was it measured, and when did it start?     None 7. CARDIAC HISTORY: Do you have any history of heart disease? (e.g., heart attack, congestive heart failure)      None 8. LUNG HISTORY: Do you have any history of lung disease?  (e.g., pulmonary embolus, asthma, emphysema)     ILD 9. PE RISK FACTORS: Do you have a history of blood clots? (or: recent major surgery, recent prolonged travel, bedridden)     None 10. OTHER SYMPTOMS: Do you have any other  symptoms? (e.g., runny nose, wheezing, chest pain)       Runny nose, congestion, ear pressure, intermittent chest tightness 12. TRAVEL: Have you traveled out of the country in the last month? (e.g., travel history, exposures)       None  Protocols used: Cough - Acute Productive-A-AH

## 2024-09-17 NOTE — Patient Instructions (Signed)
 It was great to see you!  Start augmentin  twice a day with food  Keep drinking plenty of fluids  Take mucinex twice a day to help with congestion   Let's follow-up if symptoms worsen or don't improve   Take care,  Tinnie Harada, NP

## 2024-09-17 NOTE — Telephone Encounter (Signed)
 Noted appointment today. Dm/cma

## 2024-09-17 NOTE — Progress Notes (Signed)
 Acute Office Visit  Subjective:     Patient ID: Vernessa Likes, female    DOB: 09-Jan-1941, 83 y.o.   MRN: [id number]  Chief Complaint  Patient presents with   Cough    Productive cough, congestion, sore throat for 2-3 weeks, SOB,  bilateral ear pain, mid-right back pain    HPI Discussed the use of AI scribe software for clinical note transcription with the patient, who gave verbal consent to proceed.  History of Present Illness   Oneika Nickie Warwick is an 83 year old female with interstitial lung disease who presents with cough, congestion, and ear pain. She is accompanied by her daughter, Stephane.  She has experienced a cough and congestion for a few weeks, with the cough producing light green mucus. She gargles with salt water for her sore throat and uses Vicks at night. No fever is present, and she has not been in contact with anyone sick. She chose not to attend church recently due to her symptoms.  She has interstitial lung disease and had COVID-19 two years ago, which affected her lungs. She experienced pneumonia twice following COVID-19 and is under the care of a pulmonologist. Her shortness of breath is more pronounced, especially during her morning walks, where she has to stop more frequently to catch her breath.  She has a deviated septum and irrigates her sinuses to manage congestion, describing the discharge as significant. She experiences shooting pain in her ears, which is a recent development, but denies any significant ear pain. She practices deep breathing exercises at night to aid sleep and does not have trouble sleeping.     ROS See pertinent positives and negatives per HPI.     Objective:    BP 122/68 (BP Location: Left Arm, Patient Position: Sitting, Cuff Size: Normal)   Pulse (!) 59   Temp (!) 97.2 F (36.2 C)   Ht 5' 2 (1.575 m)   Wt 170 lb 3.2 oz (77.2 kg)   SpO2 100%   BMI 31.13 kg/m    Physical Exam Vitals and nursing note reviewed.   Constitutional:      General: She is not in acute distress.    Appearance: Normal appearance.  HENT:     Head: Normocephalic.     Right Ear: Ear canal and external ear normal. Tympanic membrane is erythematous.     Left Ear: Ear canal and external ear normal. Tympanic membrane is erythematous.     Mouth/Throat:     Mouth: Mucous membranes are moist.     Pharynx: Posterior oropharyngeal erythema present. No oropharyngeal exudate.  Eyes:     Conjunctiva/sclera: Conjunctivae normal.  Cardiovascular:     Rate and Rhythm: Normal rate and regular rhythm.     Pulses: Normal pulses.     Heart sounds: Normal heart sounds.  Pulmonary:     Effort: Pulmonary effort is normal.     Breath sounds: Rales (bilateral lower lobes) present.  Musculoskeletal:     Cervical back: Normal range of motion and neck supple. No tenderness.  Lymphadenopathy:     Cervical: No cervical adenopathy.  Skin:    General: Skin is warm.  Neurological:     General: No focal deficit present.     Mental Status: She is alert and oriented to person, place, and time.  Psychiatric:        Mood and Affect: Mood normal.        Behavior: Behavior normal.        Thought  Content: Thought content normal.        Judgment: Judgment normal.       Assessment & Plan:   Problem List Items Addressed This Visit       Respiratory   ILD (interstitial lung disease) (HCC)   Chronic interstitial lung disease with recent shortness of breath exacerbation. No acute changes. Continue regular follow-up with pulmonologist. Encouraged daily walking routine.         Other   Acute cough - Primary   Cough with light green mucus, likely from upper respiratory infection. No fever. Continue nasal rinses and Vicks application. Encouraged increased fluid intake and recommended Mucinex twice a day. Check xray today to rule out pneumonia.       Relevant Orders   DG Chest 2 View (Completed)   Other Visit Diagnoses       Acute otitis media,  unspecified otitis media type       Bilateral ear infections with pain, likely due to recent upper respiratory symptoms. No fever present. Prescribed Augmentin  twice a day with food.   Relevant Medications   amoxicillin -clavulanate (AUGMENTIN ) 875-125 MG tablet       Meds ordered this encounter  Medications   amoxicillin -clavulanate (AUGMENTIN ) 875-125 MG tablet    Sig: Take 1 tablet by mouth 2 (two) times daily.    Dispense:  20 tablet    Refill:  0    Return if symptoms worsen or fail to improve.  Tinnie DELENA Harada, NP

## 2024-09-18 ENCOUNTER — Encounter: Payer: Self-pay | Admitting: Internal Medicine

## 2024-09-18 ENCOUNTER — Encounter: Payer: Self-pay | Admitting: Family Medicine

## 2024-09-18 ENCOUNTER — Encounter: Payer: Self-pay | Admitting: Nurse Practitioner

## 2024-09-18 ENCOUNTER — Ambulatory Visit: Payer: Self-pay | Admitting: Nurse Practitioner

## 2024-09-18 MED ORDER — AZITHROMYCIN 250 MG PO TABS: ORAL_TABLET | ORAL | 0 refills | Status: AC

## 2024-09-18 NOTE — Telephone Encounter (Signed)
 See other encounter

## 2024-09-19 DIAGNOSIS — R051 Acute cough: Secondary | ICD-10-CM | POA: Insufficient documentation

## 2024-09-19 NOTE — Assessment & Plan Note (Signed)
 Chronic interstitial lung disease with recent shortness of breath exacerbation. No acute changes. Continue regular follow-up with pulmonologist. Encouraged daily walking routine.

## 2024-09-19 NOTE — Assessment & Plan Note (Signed)
 Cough with light green mucus, likely from upper respiratory infection. No fever. Continue nasal rinses and Vicks application. Encouraged increased fluid intake and recommended Mucinex twice a day. Check xray today to rule out pneumonia.

## 2024-09-30 DIAGNOSIS — H04123 Dry eye syndrome of bilateral lacrimal glands: Secondary | ICD-10-CM | POA: Diagnosis not present

## 2024-09-30 DIAGNOSIS — H0100B Unspecified blepharitis left eye, upper and lower eyelids: Secondary | ICD-10-CM | POA: Diagnosis not present

## 2024-09-30 DIAGNOSIS — H18523 Epithelial (juvenile) corneal dystrophy, bilateral: Secondary | ICD-10-CM | POA: Diagnosis not present

## 2024-09-30 DIAGNOSIS — H401132 Primary open-angle glaucoma, bilateral, moderate stage: Secondary | ICD-10-CM | POA: Diagnosis not present

## 2024-09-30 DIAGNOSIS — H0100A Unspecified blepharitis right eye, upper and lower eyelids: Secondary | ICD-10-CM | POA: Diagnosis not present

## 2024-11-05 ENCOUNTER — Encounter

## 2024-12-12 ENCOUNTER — Ambulatory Visit: Admitting: Internal Medicine

## 2024-12-12 ENCOUNTER — Encounter

## 2025-01-21 ENCOUNTER — Ambulatory Visit: Admitting: Family Medicine

## 2025-02-13 ENCOUNTER — Ambulatory Visit
